# Patient Record
Sex: Male | Born: 1955 | Race: White | Hispanic: No | Smoking: Never smoker
Health system: Southern US, Community
[De-identification: ages and names within clinical notes are randomized; demographics above are authoritative.]

## PROBLEM LIST (undated history)

## (undated) DIAGNOSIS — D75839 Thrombocytosis, unspecified: Secondary | ICD-10-CM

## (undated) DIAGNOSIS — I2699 Other pulmonary embolism without acute cor pulmonale: Secondary | ICD-10-CM

## (undated) DIAGNOSIS — D649 Anemia, unspecified: Secondary | ICD-10-CM

## (undated) DIAGNOSIS — D72819 Decreased white blood cell count, unspecified: Secondary | ICD-10-CM

## (undated) DIAGNOSIS — C801 Malignant (primary) neoplasm, unspecified: Secondary | ICD-10-CM

## (undated) DIAGNOSIS — D473 Essential (hemorrhagic) thrombocythemia: Secondary | ICD-10-CM

## (undated) HISTORY — PX: KNEE ARTHROSCOPY: SUR90

---
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
## 1994-11-20 DIAGNOSIS — I2699 Other pulmonary embolism without acute cor pulmonale: Secondary | ICD-10-CM

## 1994-11-20 HISTORY — DX: Other pulmonary embolism without acute cor pulmonale: I26.99

## 1999-04-03 ENCOUNTER — Emergency Department (HOSPITAL_COMMUNITY): Admission: EM | Admit: 1999-04-03 | Discharge: 1999-04-03 | Payer: Self-pay | Admitting: Emergency Medicine

## 2005-02-27 ENCOUNTER — Emergency Department (HOSPITAL_COMMUNITY): Admission: EM | Admit: 2005-02-27 | Discharge: 2005-02-27 | Payer: Self-pay | Admitting: Emergency Medicine

## 2013-03-06 ENCOUNTER — Encounter (HOSPITAL_COMMUNITY): Payer: Self-pay | Admitting: Emergency Medicine

## 2013-03-06 ENCOUNTER — Emergency Department (HOSPITAL_COMMUNITY): Payer: Worker's Compensation

## 2013-03-06 ENCOUNTER — Emergency Department (HOSPITAL_COMMUNITY)
Admission: EM | Admit: 2013-03-06 | Discharge: 2013-03-06 | Disposition: A | Discharge status: Home / Self Care | Payer: Worker's Compensation | Attending: Emergency Medicine | Admitting: Emergency Medicine

## 2013-03-06 DIAGNOSIS — S93401A Sprain of unspecified ligament of right ankle, initial encounter: Secondary | ICD-10-CM

## 2013-03-06 DIAGNOSIS — S93409A Sprain of unspecified ligament of unspecified ankle, initial encounter: Secondary | ICD-10-CM | POA: Insufficient documentation

## 2013-03-06 DIAGNOSIS — W230XXA Caught, crushed, jammed, or pinched between moving objects, initial encounter: Secondary | ICD-10-CM | POA: Insufficient documentation

## 2013-03-06 DIAGNOSIS — S9030XA Contusion of unspecified foot, initial encounter: Secondary | ICD-10-CM | POA: Insufficient documentation

## 2013-03-06 DIAGNOSIS — Y9389 Activity, other specified: Secondary | ICD-10-CM | POA: Insufficient documentation

## 2013-03-06 DIAGNOSIS — S9032XA Contusion of left foot, initial encounter: Secondary | ICD-10-CM

## 2013-03-06 DIAGNOSIS — Y929 Unspecified place or not applicable: Secondary | ICD-10-CM | POA: Insufficient documentation

## 2013-03-06 DIAGNOSIS — X500XXA Overexertion from strenuous movement or load, initial encounter: Secondary | ICD-10-CM | POA: Insufficient documentation

## 2013-03-06 HISTORY — DX: Other pulmonary embolism without acute cor pulmonale: I26.99

## 2013-03-06 MED ORDER — NAPROXEN 500 MG PO TABS: 500.0000 mg | ORAL_TABLET | Freq: Two times a day (BID) | ORAL | Status: DC
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
## 2018-11-22 ENCOUNTER — Encounter: Payer: Self-pay | Admitting: *Deleted

## 2018-11-22 NOTE — Progress Notes (Signed)
Received referral for patient, and made appointment with Dr Maylon Peppers.   Left message on home phone number this morning with no response. Attempted to call and leave generic message at work number however the person who answered the phone would not deliver the message without detailed information which I could not provide due to [place].   Gave patient information to our scheduler and she will continue to try to reach out to patient.

## 2018-11-25 NOTE — Progress Notes (Signed)
Returned patient's phone call regarding schedule. He stated,"I'm returning Ivor phone call. I was at work and couldn't answer the phone. I informed him that Roselyn Reef scheduled him for a New Patient appointment on Friday, January,10th. Labs at 11:00 and MD at 11:30. I gave him directions to the [facility] and the phone number. Instructed him to call if he needed to change the appointment date and/or time. He verbalized understanding.

## 2018-11-26 ENCOUNTER — Other Ambulatory Visit: Payer: Self-pay | Admitting: Hematology

## 2018-11-26 DIAGNOSIS — R937 Abnormal findings on diagnostic imaging of other parts of musculoskeletal system: Secondary | ICD-10-CM

## 2018-11-26 DIAGNOSIS — D539 Nutritional anemia, unspecified: Secondary | ICD-10-CM

## 2018-11-26 NOTE — Progress Notes (Signed)
[name] CONSULT NOTE  Patient Care Team: Patient, No Pcp Per as PCP - General (General Practice)  HEME/ONC OVERVIEW: 1. Innumerable bony lesions of the right humerus, ribs and right scapula, concerning for multiple myeloma -10/2018: MRI of the right shoulder for arm pain showed innumerable enhancing lesions throughout the right humerus, ribs and right scapula; no fractures; Cr 1.16, Ca 10.3, albumin 3.1, Hgb 12.9 w/ MCV 99.7  ASSESSMENT & PLAN:   Abnormal bone lesions on MRI -I reviewed the patient's external records in detail, including orthopedic clinic notes, MRI shoulder results and lab studies -In summary, patient presented to orthopedic surgery in mid 10/2018 for several month of the right shoulder pain, and imaging studies showed innumerable bone lesions involving the right humerus, ribs, and right scapula concerning for multiple myeloma; labs were notable for borderline elevated calcium (10.3) -I have ordered SPEP with IFE, free light chains, 24-hour urine protein with UPEP/UFE, beta-2 microglobulin, LDH and vitamin D level -Given the suspicion for multiple myeloma, I have ordered PET scan to assess for presence of other bony lesions -In light of the high clinical suspicion for multiple myeloma, we will go ahead and set up the patient for bone marrow biopsy  -Once the work-up is complete, then will be able to discuss treatment options and prognosis  Severe hypercalcemia -Ca 12.2 today, likely secondary to lytic bone lesions -Na mildly low, K normal  -I discussed with the patient regarding some of the benefits and risks of Xgeva, including but not limited to, edema, rash, diarrhea, electrolyte abnormalities (hypocalcemia, hypophosphatemia, etc.), increased incidence of osteonecrosis of the jaw, and in rare cases, anaphylaxis -The patient expressed understanding, and agreed to proceed with the plan of care -We will administer 1L NS and 1 dose of Xgeva today  -The  patient is strongly encouraged to maintain adequate oral hydration  -I discussed the case with Dr. 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 Ambulatory referral to Dentistry    Referral Priority:   Urgent    Referral Type:   Consultation    Referral Reason:   Specialty Services Required    Requested Specialty:   Dental General Practice    Number of Visits Requested:   1    A total of more than 60 minutes were spent face-to-face with the patient during this encounter and over half of that time was spent on counseling and coordination of care as outlined above.    All questions were answered. The patient knows to call the clinic with any problems, questions or concerns.  Return in 1 week for labs and clinic follow-up.  [name], MD 11/29/2018 12:27 PM   CHIEF COMPLAINTS/PURPOSE OF CONSULTATION:  "I am here to find out what next"  HISTORY OF PRESENTING ILLNESS:  Danny Juarez 63 y.o. male is  here because of recent abnormal MRI of the right shoulder.  Patient reports that over the past 1 and half month, he has had progressive fatigue, generalized weakness, especially involving the right leg and left arm.  During the same time period, he has lost about 10 pounds.  He also reports  mild exertional dyspnea, chest wall soreness, and increased urination, but denies any fever, chill, night sweats, lymphadenopathy, abdominal pain, nausea, vomiting, diarrhea, abnormal bleeding or bruising. He presented to orthopedic surgeon in mid 10/2018 for evaluation of persistent right-sided arm pain.  Plain x-rays showed multiple lucencies in the proximal humerus, several which eroding into the cortex, concerning for multiple myeloma.  MRI of the right shoulder was subsequently done, which showed innumerable enhancing lesions throughout the right humerus, ribs, and the right scapula without any fracture.  Patient was referred to oncology for further evaluation of possible multiple myeloma.  I have reviewed his chart and materials related to his cancer extensively and collaborated history with the patient. Summary of oncologic history is as follows:  No history exists.    MEDICAL HISTORY:  Past Medical History:  Diagnosis Date  . PE (pulmonary embolism) 1996    SURGICAL HISTORY: Past Surgical History:  Procedure Laterality Date  . KNEE ARTHROSCOPY      SOCIAL HISTORY: Social History   Socioeconomic History  . Marital status: Married    Spouse name: Not on file  . Number of children: Not on file  . Years of education: Not on file  . Highest education level: Not on file  Occupational History  . Not on file  Social Needs  . Financial resource strain: Not on file  . Food insecurity:    Worry: Not on file    Inability: Not on file  . Transportation needs:    Medical: Not on file    Non-medical: Not on file  Tobacco Use  . Smoking status: Never Smoker  . Smokeless tobacco: Never Used  Substance and Sexual Activity  . Alcohol use: Yes    Comment: occassional  . Drug use: No  . Sexual activity: Not on file  Lifestyle  . Physical activity:    Days per week: Not on file    Minutes per session: Not on file  . Stress: Not on file  Relationships  . Social connections:     Talks on phone: Not on file    Gets together: Not on file    Attends religious service: Not on file    Active member of club or organization: Not on file    Attends meetings of clubs or organizations: Not on file    Relationship status: Not on file  . Intimate partner violence:    Fear of current or ex partner: Not on file    Emotionally abused: Not on file    Physically abused: Not on file    Forced sexual activity: Not on file  Other Topics Concern  . Not on file  Social History Narrative  . Not on file    FAMILY HISTORY: History reviewed. No pertinent family history.  ALLERGIES:  has No Known Allergies.  MEDICATIONS:  Current Outpatient Medications  Medication Sig Dispense Refill  . ferrous sulfate 325 (65 FE) MG EC tablet Take 325 mg by mouth 3 (three) times daily with meals.    . Multiple Vitamin (MULTIVITAMIN WITH MINERALS) TABS Take 1 tablet by mouth daily.     No current facility-administered medications for this visit.  REVIEW OF SYSTEMS:   Constitutional: ( - ) fevers, ( - )  chills , ( - ) night sweats Eyes: ( - ) blurriness of vision, ( - ) double vision, ( - ) watery eyes Ears, nose, mouth, throat, and face: ( - ) mucositis, ( - ) sore throat Respiratory: ( - ) cough, ( - ) dyspnea, ( - ) wheezes Cardiovascular: ( - ) palpitation, ( - ) chest discomfort, ( - ) lower extremity swelling Gastrointestinal:  ( - ) nausea, ( - ) heartburn, ( - ) change in bowel habits Skin: ( - ) abnormal skin rashes Lymphatics: ( - ) new lymphadenopathy, ( - ) easy bruising Neurological: ( - ) numbness, ( - ) tingling, ( - ) new weaknesses Behavioral/Psych: ( - ) mood change, ( - ) new changes  All other systems were reviewed with the patient and are negative.  PHYSICAL EXAMINATION: ECOG PERFORMANCE STATUS: 1 - Symptomatic but completely ambulatory  Vitals:   11/29/18 1100  BP: 117/67  Pulse: 71  Resp: 17  Temp: 98.4 F (36.9 C)  SpO2: 100%   There were no vitals  filed for this visit.  GENERAL: alert, no distress and comfortable, well appearing SKIN: skin color, texture, turgor are normal, no rashes or significant lesions EYES: conjunctiva are pink and non-injected, sclera clear OROPHARYNX: no exudate, no erythema; lips, buccal mucosa, and tongue normal  NECK: supple, non-tender LYMPH:  no palpable lymphadenopathy in the cervical or axillary LUNGS: clear to auscultation and percussion with normal breathing effort HEART: regular rate & rhythm, no murmurs, no lower extremity edema ABDOMEN: soft, non-tender, non-distended, normal bowel sounds Musculoskeletal: no cyanosis of digits and no clubbing  PSYCH: alert & oriented x 3, fluent speech NEURO: no focal motor/sensory deficits, slightly wide based gait due to lower extremity pain   LABORATORY DATA:  I have reviewed the data as listed Lab Results  Component Value Date   WBC 3.7 (L) 11/29/2018   HGB 10.0 (L) 11/29/2018   HCT 27.5 (L) 11/29/2018   MCV 98.6 11/29/2018   PLT 187 11/29/2018   Lab Results  Component Value Date   NA 133 (L) 11/29/2018   K 3.9 11/29/2018   CL 98 11/29/2018   CO2 31 11/29/2018    RADIOGRAPHIC STUDIES: I have personally reviewed the radiological images as listed and agreed with the findings in the report.

## 2018-11-27 ENCOUNTER — Telehealth: Payer: Self-pay | Admitting: Hematology

## 2018-11-27 NOTE — Telephone Encounter (Signed)
Spoke with patient regarding appointment for 11/29/2018.  He stated that he was already aware of this appoiontment

## 2018-11-29 ENCOUNTER — Encounter (INDEPENDENT_AMBULATORY_CARE_PROVIDER_SITE_OTHER): Payer: Self-pay

## 2018-11-29 ENCOUNTER — Other Ambulatory Visit: Payer: Self-pay

## 2018-11-29 ENCOUNTER — Encounter: Payer: Self-pay | Admitting: *Deleted

## 2018-11-29 ENCOUNTER — Telehealth: Payer: Self-pay | Admitting: *Deleted

## 2018-11-29 ENCOUNTER — Other Ambulatory Visit: Payer: Self-pay | Admitting: Hematology

## 2018-11-29 ENCOUNTER — Inpatient Hospital Stay (HOSPITAL_BASED_OUTPATIENT_CLINIC_OR_DEPARTMENT_OTHER): Payer: 59 | Admitting: Hematology

## 2018-11-29 ENCOUNTER — Encounter: Payer: Self-pay | Admitting: Hematology

## 2018-11-29 ENCOUNTER — Inpatient Hospital Stay: Payer: 59 | Attending: Hematology

## 2018-11-29 ENCOUNTER — Inpatient Hospital Stay: Payer: 59

## 2018-11-29 DIAGNOSIS — D6481 Anemia due to antineoplastic chemotherapy: Secondary | ICD-10-CM | POA: Insufficient documentation

## 2018-11-29 DIAGNOSIS — D649 Anemia, unspecified: Secondary | ICD-10-CM | POA: Diagnosis not present

## 2018-11-29 DIAGNOSIS — D539 Nutritional anemia, unspecified: Secondary | ICD-10-CM

## 2018-11-29 DIAGNOSIS — G629 Polyneuropathy, unspecified: Secondary | ICD-10-CM | POA: Diagnosis not present

## 2018-11-29 DIAGNOSIS — N179 Acute kidney failure, unspecified: Secondary | ICD-10-CM

## 2018-11-29 DIAGNOSIS — M899 Disorder of bone, unspecified: Secondary | ICD-10-CM | POA: Diagnosis not present

## 2018-11-29 DIAGNOSIS — E871 Hypo-osmolality and hyponatremia: Secondary | ICD-10-CM | POA: Diagnosis not present

## 2018-11-29 DIAGNOSIS — D72819 Decreased white blood cell count, unspecified: Secondary | ICD-10-CM | POA: Insufficient documentation

## 2018-11-29 DIAGNOSIS — C9000 Multiple myeloma not having achieved remission: Secondary | ICD-10-CM | POA: Diagnosis present

## 2018-11-29 DIAGNOSIS — D701 Agranulocytosis secondary to cancer chemotherapy: Secondary | ICD-10-CM | POA: Insufficient documentation

## 2018-11-29 DIAGNOSIS — R937 Abnormal findings on diagnostic imaging of other parts of musculoskeletal system: Secondary | ICD-10-CM

## 2018-11-29 DIAGNOSIS — N189 Chronic kidney disease, unspecified: Secondary | ICD-10-CM | POA: Insufficient documentation

## 2018-11-29 DIAGNOSIS — T451X5A Adverse effect of antineoplastic and immunosuppressive drugs, initial encounter: Secondary | ICD-10-CM

## 2018-11-29 LAB — CBC WITH DIFFERENTIAL (CANCER CENTER ONLY)
Abs Immature Granulocytes: 0.01 10*3/uL (ref 0.00–0.07)
BASOS ABS: 0 10*3/uL (ref 0.0–0.1)
Basophils Relative: 0 %
EOS ABS: 0.1 10*3/uL (ref 0.0–0.5)
Eosinophils Relative: 2 %
HCT: 27.5 % — ABNORMAL LOW (ref 39.0–52.0)
Hemoglobin: 10 g/dL — ABNORMAL LOW (ref 13.0–17.0)
Immature Granulocytes: 0 %
LYMPHS PCT: 32 %
Lymphs Abs: 1.2 10*3/uL (ref 0.7–4.0)
MCH: 35.8 pg — ABNORMAL HIGH (ref 26.0–34.0)
MCHC: 36.4 g/dL — ABNORMAL HIGH (ref 30.0–36.0)
MCV: 98.6 fL (ref 80.0–100.0)
MONO ABS: 0.3 10*3/uL (ref 0.1–1.0)
Monocytes Relative: 8 %
NRBC: 0 % (ref 0.0–0.2)
Neutro Abs: 2.1 10*3/uL (ref 1.7–7.7)
Neutrophils Relative %: 58 %
Platelet Count: 187 10*3/uL (ref 150–400)
RBC: 2.79 MIL/uL — AB (ref 4.22–5.81)
RDW: 11.1 % — AB (ref 11.5–15.5)
WBC: 3.7 10*3/uL — AB (ref 4.0–10.5)

## 2018-11-29 LAB — CMP (CANCER CENTER ONLY)
ALT: 30 U/L (ref 0–44)
ANION GAP: 4 — AB (ref 5–15)
AST: 24 U/L (ref 15–41)
Albumin: 2.9 g/dL — ABNORMAL LOW (ref 3.5–5.0)
Alkaline Phosphatase: 59 U/L (ref 38–126)
BILIRUBIN TOTAL: 0.3 mg/dL (ref 0.3–1.2)
BUN: 65 mg/dL — ABNORMAL HIGH (ref 8–23)
CHLORIDE: 98 mmol/L (ref 98–111)
CO2: 31 mmol/L (ref 22–32)
Calcium: 12.2 mg/dL — ABNORMAL HIGH (ref 8.9–10.3)
Creatinine: 2.89 mg/dL — ABNORMAL HIGH (ref 0.61–1.24)
GFR, EST NON AFRICAN AMERICAN: 22 mL/min — AB (ref >60)
GFR, Est AFR Am: 26 mL/min — ABNORMAL LOW (ref >60)
Glucose, Bld: 108 mg/dL — ABNORMAL HIGH (ref 70–99)
POTASSIUM: 3.9 mmol/L (ref 3.5–5.1)
Sodium: 133 mmol/L — ABNORMAL LOW (ref 135–145)
Total Protein: 9.4 g/dL — ABNORMAL HIGH (ref 6.5–8.1)

## 2018-11-29 LAB — FOLATE: FOLATE: 84.6 ng/mL (ref >5.9)

## 2018-11-29 LAB — VITAMIN B12: Vitamin B-12: 521 pg/mL (ref 180–914)

## 2018-11-29 LAB — SAVE SMEAR (SSMR)

## 2018-11-29 LAB — LACTATE DEHYDROGENASE: LDH: 180 U/L (ref 98–192)

## 2018-11-29 MED ORDER — SODIUM CHLORIDE 0.9 % IV SOLN
Freq: Once | INTRAVENOUS | Status: AC
  Administered 2018-11-29: 13:00:00 via INTRAVENOUS
  Filled 2018-11-29: qty 250

## 2018-11-29 MED ORDER — DENOSUMAB 120 MG/1.7ML ~~LOC~~ SOLN
120.0000 mg | Freq: Once | SUBCUTANEOUS | Status: AC
  Administered 2018-11-29: 120 mg via SUBCUTANEOUS

## 2018-11-29 MED ORDER — DENOSUMAB 120 MG/1.7ML ~~LOC~~ SOLN
SUBCUTANEOUS | Status: AC
  Filled 2018-11-29: qty 1.7

## 2018-11-29 NOTE — Patient Instructions (Addendum)
Dehydration, Adult  Dehydration is a condition in which there is not enough fluid or water in the body. This happens when you lose more fluids than you take in. Important organs, such as the kidneys, brain, and heart, cannot function without a proper amount of fluids. Any loss of fluids from the body can lead to dehydration. Dehydration can range from mild to severe. This condition should be treated right away to prevent it from becoming severe. What are the causes? This condition may be caused by:  Vomiting.  Diarrhea.  Excessive sweating, such as from heat exposure or exercise.  Not drinking enough fluid, especially: ? When ill. ? While doing activity that requires a lot of energy.  Excessive urination.  Fever.  Infection.  Certain medicines, such as medicines that cause the body to lose excess fluid (diuretics).  Inability to access safe drinking water.  Reduced physical ability to get adequate water and food. What increases the risk? This condition is more likely to develop in people:  Who have a poorly controlled long-term (chronic) illness, such as diabetes, heart disease, or kidney disease.  Who are age 67 or older.  Who are disabled.  Who live in a place with high altitude.  Who play endurance sports. What are the signs or symptoms? Symptoms of mild dehydration may include:  Thirst.  Dry lips.  Slightly dry mouth.  Dry, warm skin.  Dizziness. Symptoms of moderate dehydration may include:  Very dry mouth.  Muscle cramps.  Dark urine. Urine may be the color of tea.  Decreased urine production.  Decreased tear production.  Heartbeat that is irregular or faster than normal (palpitations).  Headache.  Light-headedness, especially when you stand up from a sitting position.  Fainting (syncope). Symptoms of severe dehydration may include:  Changes in skin, such as: ? Cold and clammy skin. ? Blotchy (mottled) or pale skin. ? Skin that does  not quickly return to normal after being lightly pinched and released (poor skin turgor).  Changes in body fluids, such as: ? Extreme thirst. ? No tear production. ? Inability to sweat when body temperature is high, such as in hot weather. ? Very little urine production.  Changes in vital signs, such as: ? Weak pulse. ? Pulse that is more than 100 beats a minute when sitting still. ? Rapid breathing. ? Low blood pressure.  Other changes, such as: ? Sunken eyes. ? Cold hands and feet. ? Confusion. ? Lack of energy (lethargy). ? Difficulty waking up from sleep. ? Short-term weight loss. ? Unconsciousness. How is this diagnosed? This condition is diagnosed based on your symptoms and a physical exam. Blood and urine tests may be done to help confirm the diagnosis. How is this treated? Treatment for this condition depends on the severity. Mild or moderate dehydration can often be treated at home. Treatment should be started right away. Do not wait until dehydration becomes severe. Severe dehydration is an emergency and it needs to be treated in a hospital. Treatment for mild dehydration may include:  Drinking more fluids.  Replacing salts and minerals in your blood (electrolytes) that you may have lost. Treatment for moderate dehydration may include:  Drinking an oral rehydration solution (ORS). This is a drink that helps you replace fluids and electrolytes (rehydrate). It can be found at pharmacies and retail stores. Treatment for severe dehydration may include:  Receiving fluids through an IV tube.  Receiving an electrolyte solution through a feeding tube that is passed through your nose and  into your stomach (nasogastric tube, or NG tube).  Correcting any abnormalities in electrolytes.  Treating the underlying cause of dehydration. Follow these instructions at home:  If directed by your health care provider, drink an ORS: ? Make an ORS by following instructions on the  package. ? Start by drinking small amounts, about  cup (120 mL) every 5-10 minutes. ? Slowly increase how much you drink until you have taken the amount recommended by your health care provider.  Drink enough clear fluid to keep your urine clear or pale yellow. If you were told to drink an ORS, finish the ORS first, then start slowly drinking other clear fluids. Drink fluids such as: ? Water. Do not drink only water. Doing that can lead to having too little salt (sodium) in the body (hyponatremia). ? Ice chips. ? Fruit juice that you have added water to (diluted fruit juice). ? Low-calorie sports drinks.  Avoid: ? Alcohol. ? Drinks that contain a lot of sugar. These include high-calorie sports drinks, fruit juice that is not diluted, and soda. ? Caffeine. ? Foods that are greasy or contain a lot of fat or sugar.  Take over-the-counter and prescription medicines only as told by your health care provider.  Do not take sodium tablets. This can lead to having too much sodium in the body (hypernatremia).  Eat foods that contain a healthy balance of electrolytes, such as bananas, oranges, potatoes, tomatoes, and spinach.  Keep all follow-up visits as told by your health care provider. This is important. Contact a health care provider if:  You have abdominal pain that: ? Gets worse. ? Stays in one area (localizes).  You have a rash.  You have a stiff neck.  You are more irritable than usual.  You are sleepier or more difficult to wake up than usual.  You feel weak or dizzy.  You feel very thirsty.  You have urinated only a small amount of very dark urine over 6-8 hours. Get help right away if:  You have symptoms of severe dehydration.  You cannot drink fluids without vomiting.  Your symptoms get worse with treatment.  You have a fever.  You have a severe headache.  You have vomiting or diarrhea that: ? Gets worse. ? Does not go away.  You have blood or green matter  (bile) in your vomit.  You have blood in your stool. This may cause stool to look black and tarry.  You have not urinated in 6-8 hours.  You faint.  Your heart rate while sitting still is over 100 beats a minute.  You have trouble breathing. This information is not intended to replace advice given to you by your health care provider. Make sure you discuss any questions you have with your health care provider. Document Released: 11/06/2005 Document Revised: 06/02/2016 Document Reviewed: 12/31/2015 Elsevier Interactive Patient Education  2019 [employer].  Denosumab injection Delton See) What is this medicine? DENOSUMAB (den oh sue mab) slows bone breakdown. Prolia is used to treat osteoporosis in women after menopause and in men, and in people who are taking corticosteroids for 6 months or more. Delton See is used to treat a high calcium level due to cancer and to prevent bone fractures and other bone problems caused by multiple myeloma or cancer bone metastases. Delton See is also used to treat giant cell tumor of the bone. This medicine may be used for other purposes; ask your health care provider or pharmacist if you have questions. COMMON BRAND NAME(S): Prolia, XGEVA  What should I tell my health care provider before I take this medicine? They need to know if you have any of these conditions: -dental disease -having surgery or tooth extraction -infection -kidney disease -low levels of calcium or Vitamin D in the blood -malnutrition -on hemodialysis -skin conditions or sensitivity -thyroid or parathyroid disease -an unusual reaction to denosumab, other medicines, foods, dyes, or preservatives -pregnant or trying to get pregnant -breast-feeding How should I use this medicine? This medicine is for injection under the skin. It is given by a health care professional in a hospital or clinic setting. A special MedGuide will be given to you before each treatment. Be sure to read this information  carefully each time. For Prolia, talk to your pediatrician regarding the use of this medicine in children. Special care may be needed. For Delton See, talk to your pediatrician regarding the use of this medicine in children. While this drug may be prescribed for children as young as 13 years for selected conditions, precautions do apply. Overdosage: If you think you have taken too much of this medicine contact a poison control center or emergency room at once. NOTE: This medicine is only for you. Do not share this medicine with others. What if I miss a dose? It is important not to miss your dose. Call your doctor or health care professional if you are unable to keep an appointment. What may interact with this medicine? Do not take this medicine with any of the following medications: -other medicines containing denosumab This medicine may also interact with the following medications: -medicines that lower your chance of fighting infection -steroid medicines like prednisone or cortisone This list may not describe all possible interactions. Give your health care provider a list of all the medicines, herbs, non-prescription drugs, or dietary supplements you use. Also tell them if you smoke, drink alcohol, or use illegal drugs. Some items may interact with your medicine. What should I watch for while using this medicine? Visit your doctor or health care professional for regular checks on your progress. Your doctor or health care professional may order blood tests and other tests to see how you are doing. Call your doctor or health care professional for advice if you get a fever, chills or sore throat, or other symptoms of a cold or flu. Do not treat yourself. This drug may decrease your body's ability to fight infection. Try to avoid being around people who are sick. You should make sure you get enough calcium and vitamin D while you are taking this medicine, unless your doctor tells you not to. Discuss the  foods you eat and the vitamins you take with your health care professional. See your dentist regularly. Brush and floss your teeth as directed. Before you have any dental work done, tell your dentist you are receiving this medicine. Do not become pregnant while taking this medicine or for 5 months after stopping it. Talk with your doctor or health care professional about your birth control options while taking this medicine. Women should inform their doctor if they wish to become pregnant or think they might be pregnant. There is a potential for serious side effects to an unborn child. Talk to your health care professional or pharmacist for more information. What side effects may I notice from receiving this medicine? Side effects that you should report to your doctor or health care professional as soon as possible: -allergic reactions like skin rash, itching or hives, swelling of the face, lips, or tongue -bone  pain -breathing problems -dizziness -jaw pain, especially after dental work -redness, blistering, peeling of the skin -signs and symptoms of infection like fever or chills; cough; sore throat; pain or trouble passing urine -signs of low calcium like fast heartbeat, muscle cramps or muscle pain; pain, tingling, numbness in the hands or feet; seizures -unusual bleeding or bruising -unusually weak or tired Side effects that usually do not require medical attention (report to your doctor or health care professional if they continue or are bothersome): -constipation -diarrhea -headache -joint pain -loss of appetite -muscle pain -runny nose -tiredness -upset stomach This list may not describe all possible side effects. Call your doctor for medical advice about side effects. You may report side effects to FDA at 1-800-FDA-1088. Where should I keep my medicine? This medicine is only given in a clinic, doctor's office, or other health care setting and will not be stored at home. NOTE: This  sheet is a summary. It may not cover all possible information. If you have questions about this medicine, talk to your doctor, pharmacist, or health care provider.  2019 Elsevier/Gold Standard (2018-03-15 16:10:44)

## 2018-11-29 NOTE — Telephone Encounter (Signed)
Received voice mail message from Dr. Dorothyann Gibbs stating,"patient can be seen in the Dental clinic on Monday, January, 13th at 8:30 am for a pre Xgeva exam. The Dental clinic office number is [phone]. This message was given to Dr. Maylon Peppers. Directions and appointment time was given to the patient. He verbalized understanding.

## 2018-11-29 NOTE — Progress Notes (Signed)
..  Initial RN Navigator Patient Visit  Name: Danny Juarez Date of Referral : 11/22/2018 Diagnosis: Multiple Myeloma  Met with patient prior to their visit with MD. Hanley Seamen patient "Your Patient Navigator" handout which explains my role, areas in which I am able to help, and all the contact information for myself and the office. Also gave patient MD and Navigator business card. Reviewed with patient the general overview of expected course after initial diagnosis and time frame for all steps to be completed.  Patient completed visit with Dr. Maylon Peppers  Revisited with patient after MD visit. Patient will need  - following radiological exams  BM Biopsy - Dr Maylon Peppers will reach out to R[facility] Comment to schedule bedside BM  PET - scheduled for 12/11/2018 at 74  The following appointments were made while patient was still here in the office. Address and phone numbers of imagine locations, referring MD offices, surgical procedures which have been scheduled have been provided.   Patient understands all follow up procedures and expectations. They have my number to reach out for any further clarification or additional needs. Will call patient in 5-7 days to see if any further needs have presented, or if patient has any further questions or needs.

## 2018-11-30 LAB — VITAMIN D 25 HYDROXY (VIT D DEFICIENCY, FRACTURES): VIT D 25 HYDROXY: 58.4 ng/mL (ref 30.0–100.0)

## 2018-11-30 LAB — BETA 2 MICROGLOBULIN, SERUM: Beta-2 Microglobulin: 6.8 mg/L — ABNORMAL HIGH (ref 0.6–2.4)

## 2018-12-02 ENCOUNTER — Other Ambulatory Visit: Payer: Self-pay

## 2018-12-02 ENCOUNTER — Ambulatory Visit: Payer: Self-pay | Admitting: Hematology

## 2018-12-02 ENCOUNTER — Encounter (HOSPITAL_COMMUNITY): Payer: Self-pay | Admitting: Dentistry

## 2018-12-02 ENCOUNTER — Ambulatory Visit (HOSPITAL_COMMUNITY): Payer: Self-pay | Admitting: Dentistry

## 2018-12-02 DIAGNOSIS — M278 Other specified diseases of jaws: Secondary | ICD-10-CM

## 2018-12-02 DIAGNOSIS — K036 Deposits [accretions] on teeth: Secondary | ICD-10-CM

## 2018-12-02 DIAGNOSIS — M264 Malocclusion, unspecified: Secondary | ICD-10-CM

## 2018-12-02 DIAGNOSIS — Z01818 Encounter for other preprocedural examination: Secondary | ICD-10-CM

## 2018-12-02 DIAGNOSIS — K06010 Localized gingival recession, unspecified: Secondary | ICD-10-CM

## 2018-12-02 DIAGNOSIS — M2630 Unspecified anomaly of tooth position of fully erupted tooth or teeth: Secondary | ICD-10-CM

## 2018-12-02 DIAGNOSIS — K031 Abrasion of teeth: Secondary | ICD-10-CM

## 2018-12-02 DIAGNOSIS — K045 Chronic apical periodontitis: Secondary | ICD-10-CM

## 2018-12-02 DIAGNOSIS — K08409 Partial loss of teeth, unspecified cause, unspecified class: Secondary | ICD-10-CM

## 2018-12-02 DIAGNOSIS — K0530 Chronic periodontitis, unspecified: Secondary | ICD-10-CM

## 2018-12-02 DIAGNOSIS — K029 Dental caries, unspecified: Secondary | ICD-10-CM | POA: Diagnosis not present

## 2018-12-02 DIAGNOSIS — K055 Other periodontal diseases: Secondary | ICD-10-CM

## 2018-12-02 DIAGNOSIS — M270 Developmental disorders of jaws: Secondary | ICD-10-CM

## 2018-12-02 DIAGNOSIS — K0889 Other specified disorders of teeth and supporting structures: Secondary | ICD-10-CM

## 2018-12-02 DIAGNOSIS — K030 Excessive attrition of teeth: Secondary | ICD-10-CM

## 2018-12-02 LAB — KAPPA/LAMBDA LIGHT CHAINS
KAPPA, LAMDA LIGHT CHAIN RATIO: 1.95 — AB (ref 0.26–1.65)
Kappa free light chain: 15.2 mg/L (ref 3.3–19.4)
LAMDA FREE LIGHT CHAINS: 7.8 mg/L (ref 5.7–26.3)

## 2018-12-02 NOTE — Patient Instructions (Signed)
Patient is being referred to a general dentist with expertise in both medical and dental complexity. The patient may then be referred to an oral surgeon for extraction procedures with alveoloplasty and pre-prosthetic surgery as needed. Ideally, the patient should not be administered further Xgeva therapy until dental treatment has been provided.  Dr. Enrique Sack

## 2018-12-02 NOTE — Progress Notes (Addendum)
DENTAL CONSULTATION  Date of Consultation:  12/02/2018 Patient Name:   Danny Juarez Date of Birth:   05-17-1956 Medical Record Number: [id number]  VITALS: BP (!) 111/58 (BP Location: Right Arm)   Pulse 70   Temp 97.9 F (36.6 C)   CHIEF COMPLAINT: Patient referred for a dental consultation by Dr. Maylon Peppers.  HPI: Danny Juarez Is a 63 year old male recently diagnosed with hypercalcemia with multiple bone lesions the most likely represents multiple myeloma. Patient is still being worked up for myeloma with pending PET scan and bone marrow biopsy. Patient with 1 dose of Xgeva to treat the hypercalcemia with further doses held until patient has dental clearance. Patient is now seen as part of a Xgeva therapy dental protocol examination.  The patient currently denies acute toothaches, swellings or abscesses.  Patient did have a history of lower right molar toothache symptoms approximately 2 years ago. Patient points to tooth #30 as the offending tooth. Patient describes the intermittent pain as being dull and achy in nature. Patient indicates the pain reached an intensity of 5 out of 10 but is currently 0 out of 10 in intensity. The patient was last seen by his primary dentist, Dr. Wyline Beady, in March 2019 for an exam and cleaning. Patient was seen prior to that in late 2018 for a comprehensive dental examination.  Patient indicates that he was given a treatment plan " for about $20,000" by patient report.  Patient did not follow-up with that dental treatment plan.  Prior to the dental appointment in 2018, the patient had not seen a dentist in over 10 years. The patient denies having any partial dentures. The patient denies having dental phobia.   PROBLEM LIST: Patient Active Problem List   Diagnosis Date Noted  . Abnormal MRI scan, bone 11/29/2018  . Normocytic anemia 11/29/2018  . Leukopenia 11/29/2018  . Hypercalcemia 11/29/2018  . AKI (acute kidney injury) ([place]) 11/29/2018     PMH: Past Medical History:  Diagnosis Date  . PE (pulmonary embolism) 1996    PSH: Past Surgical History:  Procedure Laterality Date  . KNEE ARTHROSCOPY      ALLERGIES: No Known Allergies  MEDICATIONS: Current Outpatient Medications  Medication Sig Dispense Refill  . Denosumab (XGEVA Gervais) Inject into the skin every 28 (twenty-eight) days.     . ferrous sulfate 325 (65 FE) MG EC tablet Take 325 mg by mouth 3 (three) times daily with meals.    . Multiple Vitamin (MULTIVITAMIN WITH MINERALS) TABS Take 1 tablet by mouth daily.     No current facility-administered medications for this visit.     LABS: Lab Results  Component Value Date   WBC 3.7 (L) 11/29/2018   HGB 10.0 (L) 11/29/2018   HCT 27.5 (L) 11/29/2018   MCV 98.6 11/29/2018   PLT 187 11/29/2018      Component Value Date/Time   NA 133 (L) 11/29/2018 1048   K 3.9 11/29/2018 1048   CL 98 11/29/2018 1048   CO2 31 11/29/2018 1048   GLUCOSE 108 (H) 11/29/2018 1048   BUN 65 (H) 11/29/2018 1048   CREATININE 2.89 (H) 11/29/2018 1048   CALCIUM 12.2 (H) 11/29/2018 1048   GFRNONAA 22 (L) 11/29/2018 1048   GFRAA 26 (L) 11/29/2018 1048   No results found for: INR, PROTIME No results found for: PTT  SOCIAL HISTORY: Social History   Socioeconomic History  . Marital status: Divorced    Spouse name: Not on file  . Number of children: 1  .  Years of education: Not on file  . Highest education level: Not on file  Occupational History  . Not on file  Social Needs  . Financial resource strain: Not on file  . Food insecurity:    Worry: Not on file    Inability: Not on file  . Transportation needs:    Medical: Not on file    Non-medical: Not on file  Tobacco Use  . Smoking status: Never Smoker  . Smokeless tobacco: Never Used  Substance and Sexual Activity  . Alcohol use: Yes    Comment: occassional  . Drug use: No  . Sexual activity: Not on file  Lifestyle  . Physical activity:    Days per week: Not on  file    Minutes per session: Not on file  . Stress: Not on file  Relationships  . Social connections:    Talks on phone: Not on file    Gets together: Not on file    Attends religious service: Not on file    Active member of club or organization: Not on file    Attends meetings of clubs or organizations: Not on file    Relationship status: Not on file  . Intimate partner violence:    Fear of current or ex partner: Not on file    Emotionally abused: Not on file    Physically abused: Not on file    Forced sexual activity: Not on file  Other Topics Concern  . Not on file  Social History Narrative   Patient is divorced with one daughter that lives in [place], [place].   Patient has never smoked nor used smokeless tobacco.   Patient with occasional use of alcohol.   Patient denies use of illicit drugs.   Patient is a retired Set designer.    FAMILY HISTORY: History reviewed. No pertinent family history.  REVIEW OF SYSTEMS: Reviewed with the patient as per History of present illness. Psych: Patient denies having dental phobia.  DENTAL HISTORY: CHIEF COMPLAINT: Patient referred for a dental consultation by Dr. Maylon Peppers.  HPI: Danny Juarez Is a 63 year old male recently diagnosed with hypercalcemia with multiple bone lesions the most likely represents multiple myeloma. Patient is still being worked up for myeloma with pending PET scan and bone marrow biopsy. Patient with 1 dose of Xgeva to treat the hypercalcemia with further doses held until patient has dental clearance. Patient is now seen as part of a Xgeva therapy dental protocol examination.  The patient currently denies acute toothaches, swellings or abscesses.  Patient did have a history of lower right molar toothache symptoms approximately 2 years ago. Patient points to tooth #30 as the offending tooth. Patient describes the intermittent pain as being dull and achy in nature. Patient indicates the pain  reached an intensity of 5 out of 10 but is currently 0 out of 10 in intensity. The patient was last seen by his primary dentist, Dr. Wyline Beady, in March 2019 for an exam and cleaning. Patient was seen prior to that in late 2018 for a comprehensive dental examination.  Patient indicates that he was given a treatment plan " for about $20,000" by patient report.  Patient did not follow-up with that dental treatment plan.  Prior to the dental appointment in 2018, the patient had not seen a dentist in over 10 years. The patient denies having any partial dentures. The patient denies having dental phobia.   DENTAL EXAMINATION: GENERAL:  The patient is a well-developed, well-nourished  male in no acute distress. HEAD AND NECK:  There is no palpable neck lymphadenopathy. The patient denies acute TMJ symptoms. INTRAORAL EXAM:  The patient has normal saliva. The patient has bilateral mandibular lingual tori. The patient has maxillary right and maxillary left buccal exostoses.  There is no evidence of oral abscess formation.  There are multiple excessive flexure lesions noted.  There is evidence of maxillary and mandibular occlusal and incisal attrition. DENTITION:  Patient is missing tooth numbers 1, 16, 17, and 32.  There are multiple malpositioned teeth and lower anterior crowding. PERIODONTAL:  The patient has chronic periodontitis with plaque and calculus accumulations, generalized gingival recession, and lack of the test gingiva, tooth mobility, and incipient to severe bone loss. DENTAL CARIES/SUBOPTIMAL RESTORATIONS:  Multiple dental caries and multiple flexure lesions are noted. Multiple flexure lesions appear to be close to pulpal exposure. ENDODONTIC:  The patient has a history of acute pulpitis symptoms involving tooth #30. The patient denies any acute pulpitis symptoms -today. Patient has periapical pathology and radiolucency associated with tooth numbers 24, 25, and 30.  Multiple flexure lesions are  close to impinging on the pulp on tooth numbers 4, 5, 12, 13, 19, 20, 21, 28, 29, and 30. CROWN AND BRIDGE:  There are no crown or bridge restorations noted. PROSTHODONTIC:  Patient denies having partial dentures. OCCLUSION:  The patient has a poor occlusal scheme secondary to missing teeth, multiple malpositioned teeth, and evidence of incisal and occlusal attrition.  RADIOGRAPHIC INTERPRETATION: An orthopantogram was taken and supplemented with a full series of dental radiographs. There are missing tooth numbers 1, 16, 17, and 32. There is moderate to severe bone loss noted. There multiple areas appear apical pathology and radiolucency at the apices of tooth numbers 24, 25, and 30 and possibly 23 and 26.  There are multiple radiolucent areas of the teeth consistent with flexure lesions. There are multiple mandibular radiopaque areas consistent with bilateral mandibular lingual tori. There are multiple amalgam restorations noted. There are multiple radiolucent/ "punched out" areas involving the left and right angle of the mandible and right body of the mandible consistent with multiple myeloma.  ASSESSMENTS: 1. Multiple bone lesions with probable /currently undiagnosed multiple myeloma 2. Hypercalcemia  3. Xgeva therapy dental protocol examination 4. Chronic apical periodontitis 5. Dental caries 6. Multiple flexure lesions 7. Bilateral mandibular lingual tori 8. Bilateral maxillary posterior buccal exostoses 9. Chronic periodontitis with bone loss 10.Gingival recession 11. Lack of attached gingiva 12. Tooth mobility 13. Multiple missing teeth 14. Multiple malpositioned teeth 15. Maxillary and mandibular interincisal and occlusal attrition 16. Poor occlusal scheme and malocclusion 17. Risk for osteonecrosis of the jaw with future invasive dental procedures after the administration of Xgeva therapy  PLAN/RECOMMENDATIONS: 1. I discussed the risks, benefits, and complications of various  treatment options with the patient in relationship to his medical and dental conditions, use of Xgeva therapy, and risk for osteonecrosis the jaw with invasive dental procedures. We discussed various treatment options to include no treatment, multiple extractions with alveoloplasty, pre-prosthetic surgery as indicated, periodontal therapy, dental restorations, root canal therapy, crown and bridge therapy, implant therapy, and replacement of missing teeth as indicated. We discussed referral to an oral surgeon for dental extractions with alveoloplasty and pre-prosthetic surgery.  We also discussed referral to a general dentist with expertise in treatment of pateints with both medical and dental complexities.The patient currently wishes to proceed with referral to a general dentist that has expertise in both medical and dental complexities.  The patient may then be referred to an oral surgeon for surgical procedures as needed. In the meantime, ideally the Xgeva therapy should be held to allow for initial dental therapy and associated healing.  Additional doses of Xgeva therapy may significantly increase the chance for osteonecrosis of the jaw with invasive dental procedures.   2. Discussion of findings with medical team and coordination of future medical and dental care as needed.  I spent in excess of  150 minutes during the conduct of this consultation and >50% of this time involved direct face-to-face encounter for counseling and/or coordination of the patient's care.    Lenn Cal, DDS

## 2018-12-03 ENCOUNTER — Other Ambulatory Visit: Payer: Self-pay | Admitting: Hematology

## 2018-12-03 ENCOUNTER — Telehealth: Payer: Self-pay

## 2018-12-03 LAB — MULTIPLE MYELOMA PANEL, SERUM
ALBUMIN SERPL ELPH-MCNC: 3.3 g/dL (ref 2.9–4.4)
ALBUMIN/GLOB SERPL: 0.6 — AB (ref 0.7–1.7)
ALPHA 1: 0.3 g/dL (ref 0.0–0.4)
Alpha2 Glob SerPl Elph-Mcnc: 0.7 g/dL (ref 0.4–1.0)
B-Globulin SerPl Elph-Mcnc: 0.9 g/dL (ref 0.7–1.3)
Gamma Glob SerPl Elph-Mcnc: 3.8 g/dL — ABNORMAL HIGH (ref 0.4–1.8)
Globulin, Total: 5.7 g/dL — ABNORMAL HIGH (ref 2.2–3.9)
IGM (IMMUNOGLOBULIN M), SRM: 29 mg/dL (ref 20–172)
IgA: 20 mg/dL — ABNORMAL LOW (ref 61–437)
IgG (Immunoglobin G), Serum: 5602 mg/dL — ABNORMAL HIGH (ref 700–1600)
M Protein SerPl Elph-Mcnc: 3.7 g/dL — ABNORMAL HIGH
TOTAL PROTEIN ELP: 9 g/dL — AB (ref 6.0–8.5)

## 2018-12-03 NOTE — Telephone Encounter (Signed)
LVM for patient to call back to get bone marrow biopsy scheduled.

## 2018-12-04 ENCOUNTER — Encounter: Payer: Self-pay | Admitting: Hematology

## 2018-12-04 NOTE — Telephone Encounter (Signed)
-----  Message from Gardenia Phlegm, NP sent at 12/03/2018 10:23 AM EST ----- Regarding: FW: Bone marrow biopsy.  ----- Message ----- From: [name], MD Sent: 12/03/2018  10:05 AM EST To: Gardenia Phlegm, NP Subject: Bone marrow biopsy.                            Hi Danny Juarez,  How are you? I saw Danny Juarez last week for likely new multiple myeloma. He has clinical features of multiple myeloma. Would you be able to do his bone marrow biopsy in the next week or two? If you are busy, no worries and I will try to get it scheduled through radiology.  Thank you for your help!  Vedia Coffer

## 2018-12-04 NOTE — Telephone Encounter (Signed)
Patient returned nurse call to schedule BMBX.  Appointment is for 12/12/18 at 7:30 and labs to be completed after procedure.  Schedule message sent.

## 2018-12-06 ENCOUNTER — Encounter: Payer: Self-pay | Admitting: Hematology

## 2018-12-06 ENCOUNTER — Inpatient Hospital Stay: Payer: 59

## 2018-12-06 ENCOUNTER — Inpatient Hospital Stay (HOSPITAL_BASED_OUTPATIENT_CLINIC_OR_DEPARTMENT_OTHER): Payer: 59 | Admitting: Hematology

## 2018-12-06 VITALS — BP 104/54 | HR 76 | Temp 97.9°F | Resp 18 | Ht 69.0 in | Wt 167.0 lb

## 2018-12-06 DIAGNOSIS — G629 Polyneuropathy, unspecified: Secondary | ICD-10-CM

## 2018-12-06 DIAGNOSIS — N179 Acute kidney failure, unspecified: Secondary | ICD-10-CM | POA: Diagnosis not present

## 2018-12-06 DIAGNOSIS — D649 Anemia, unspecified: Secondary | ICD-10-CM

## 2018-12-06 DIAGNOSIS — R937 Abnormal findings on diagnostic imaging of other parts of musculoskeletal system: Secondary | ICD-10-CM

## 2018-12-06 DIAGNOSIS — C9000 Multiple myeloma not having achieved remission: Secondary | ICD-10-CM | POA: Diagnosis not present

## 2018-12-06 DIAGNOSIS — D72819 Decreased white blood cell count, unspecified: Secondary | ICD-10-CM

## 2018-12-06 DIAGNOSIS — E871 Hypo-osmolality and hyponatremia: Secondary | ICD-10-CM

## 2018-12-06 LAB — CBC WITH DIFFERENTIAL (CANCER CENTER ONLY)
Abs Immature Granulocytes: 0.02 10*3/uL (ref 0.00–0.07)
BASOS ABS: 0 10*3/uL (ref 0.0–0.1)
Basophils Relative: 1 %
Eosinophils Absolute: 0.1 10*3/uL (ref 0.0–0.5)
Eosinophils Relative: 3 %
HCT: 28 % — ABNORMAL LOW (ref 39.0–52.0)
Hemoglobin: 9.8 g/dL — ABNORMAL LOW (ref 13.0–17.0)
Immature Granulocytes: 1 %
Lymphocytes Relative: 43 %
Lymphs Abs: 1.6 10*3/uL (ref 0.7–4.0)
MCH: 34.6 pg — ABNORMAL HIGH (ref 26.0–34.0)
MCHC: 35 g/dL (ref 30.0–36.0)
MCV: 98.9 fL (ref 80.0–100.0)
Monocytes Absolute: 0.3 10*3/uL (ref 0.1–1.0)
Monocytes Relative: 7 %
NEUTROS PCT: 45 %
NRBC: 0 % (ref 0.0–0.2)
Neutro Abs: 1.7 10*3/uL (ref 1.7–7.7)
Platelet Count: 171 10*3/uL (ref 150–400)
RBC: 2.83 MIL/uL — ABNORMAL LOW (ref 4.22–5.81)
RDW: 11.4 % — ABNORMAL LOW (ref 11.5–15.5)
WBC: 3.6 10*3/uL — AB (ref 4.0–10.5)

## 2018-12-06 LAB — CMP (CANCER CENTER ONLY)
ALT: 27 U/L (ref 0–44)
ANION GAP: 4 — AB (ref 5–15)
AST: 19 U/L (ref 15–41)
Albumin: 3.1 g/dL — ABNORMAL LOW (ref 3.5–5.0)
Alkaline Phosphatase: 62 U/L (ref 38–126)
BUN: 37 mg/dL — ABNORMAL HIGH (ref 8–23)
CO2: 25 mmol/L (ref 22–32)
Calcium: 8.4 mg/dL — ABNORMAL LOW (ref 8.9–10.3)
Chloride: 104 mmol/L (ref 98–111)
Creatinine: 1.69 mg/dL — ABNORMAL HIGH (ref 0.61–1.24)
GFR, EST AFRICAN AMERICAN: 49 mL/min — AB (ref >60)
GFR, Estimated: 42 mL/min — ABNORMAL LOW (ref >60)
Glucose, Bld: 103 mg/dL — ABNORMAL HIGH (ref 70–99)
Potassium: 4.6 mmol/L (ref 3.5–5.1)
Sodium: 133 mmol/L — ABNORMAL LOW (ref 135–145)
Total Bilirubin: 0.3 mg/dL (ref 0.3–1.2)
Total Protein: 9.4 g/dL — ABNORMAL HIGH (ref 6.5–8.1)

## 2018-12-06 NOTE — Progress Notes (Signed)
[facility] OFFICE PROGRESS NOTE  Patient Care Team: Patient, No Pcp Per as PCP - General (General Practice)  HEME/ONC OVERVIEW: 1. Likely IgG lambda multiple myeloma  -10/2018: MRI of the right shoulder for arm pain showed innumerable enhancing lesions throughout the right humerus, ribs and right scapula; no fractures -11/2018: baseline labs  -Hgb 10, Ca 12.2, Cr 2.89, albumin 2.9  -M-spike 3.7 g/dL, free lambda 7.8, monoclonal IgG lambda on IFE, quant IgG 5602, LDH 180, B2M 6.8  -PET on 12/11/2018   -Bone marrow biopsy scheduled on 12/12/2018  2. Hypercalcemia -S/p Xgeva x 1 for Ca 12.2; dental evaluation pending  ASSESSMENT & PLAN:   Suspected IgG lambda multiple myeloma  -Work-up notable for M-spike 3.7 g/dL, IFE positive for monoclonal IgG lambda -PET scheduled on 12/11/2018, bone marrow biopsy scheduled on 12/12/2018 -I reviewed with the patient in detail regarding the diagnosis of and potential 1st line treatment options for multiple myeloma  -If multiple myeloma is confirmed, the treatment regimen will depend on the patient's renal function (RVD vs. CyBorD) -Given the patient's age and good performance status, autologous stem cell transplant will also need to be considered; timing for referral to a transplant center TBD   Right lower extremity neuropathy -New onset, mild in severity; shock like sensation below the right knee  -No weakness or gait instability  -Etiology unclear; possibly due to nerve compression -PET scheduled as above -We will monitor it for now   Severe hypercalcemia -Secondary to myeloma bone involvement -Ca 12.2 last week, requiring Xgeva x 1  -Ca 8.4 today, improving  -The patient was referred to Dr. Enrique Sack of dental medicine for dental clearance prior to continuing additional Xgeva -Per dental medicine, Delton See should be held while the patient is being evaluated for extraction procedures with alveoloplasty and pre-prosthetic surgery as  needed -I have recommended the patient to take OTC Ca-Vit D supplement to avoid hypocalcemia due to Xgeva   AKI -Secondary to myeloma-related renal injury -Cr 1.69 today, improving from 2.89 last week after IV fluid  -I encouraged the patient to maintain adequate oral hydration -We will monitor it closely for now   Normocytic anemia -Secondary to suspected bone marrow involvement by myeloma  -B12, folate normal -Hgb 9.8 today, stable  -We will monitor it for now  Leukopenia -Secondary to suspected bone marrow involvement by myeloma  -Patient denies any symptoms of infection -WBC 3.6k today, stable  -We will monitor it for now  Hyponatremia -Na 133 today, stable -Encourage oral hydration as above   Orders Placed This Encounter  Procedures  . CBC with Differential (Cancer Center Only)    Standing Status:   Future    Standing Expiration Date:   01/10/2020  . CMP ([facility] only)    Standing Status:   Future    Standing Expiration Date:   01/10/2020    All questions were answered. The patient knows to call the clinic with any problems, questions or concerns. No barriers to learning was detected.  Return in 2 weeks for labs and clinic follow-up on treatment options.  [name], MD 12/06/2018 11:46 AM  CHIEF COMPLAINT: "I am doing fine* "  INTERVAL HISTORY: Mr. Hennon returns to clinic for follow-up of suspected multiple myeloma.  Patient reports that since last visit, he has been drinking plenty of fluids and maintaining adequate urination.  He reports that over the past few days, he developed a new onset, intermittent, shocklike sensation in the right lower extremity, primarily  below the right knee.  He denies any right lower extremity weakness or gait instability.  It was very bothersome last night, but improved today.  He denies any fever, chill, night sweats, weight change, chest pain, dyspnea, abdominal pain, nausea, vomiting, or diarrhea.  SUMMARY OF ONCOLOGIC  HISTORY:   Multiple myeloma ([facility])   11/11/2018 Imaging    MRI right shoulder for arm pain showed innumerable enhancing lesions throughout the right humerus, ribs and right scapula; no fractures    11/29/2018 Miscellaneous    Baseline labs -Hgb 10, Ca 12.2, Cr 2.89, albumin 2.9 -M-spike 3.7 g/dL, free lambda 7.8, monoclonal IgG lambda on IFE, quant IgG 5602, LDH 180, B2M 6.8     REVIEW OF SYSTEMS:   Constitutional: ( - ) fevers, ( - )  chills , ( - ) night sweats Eyes: ( - ) blurriness of vision, ( - ) double vision, ( - ) watery eyes Ears, nose, mouth, throat, and face: ( - ) mucositis, ( - ) sore throat Respiratory: ( - ) cough, ( - ) dyspnea, ( - ) wheezes Cardiovascular: ( - ) palpitation, ( - ) chest discomfort, ( - ) lower extremity swelling Gastrointestinal:  ( - ) nausea, ( - ) heartburn, ( - ) change in bowel habits Skin: ( - ) abnormal skin rashes Lymphatics: ( - ) new lymphadenopathy, ( - ) easy bruising Neurological: ( - ) numbness, ( - ) tingling, ( - ) new weaknesses Behavioral/Psych: ( - ) mood change, ( - ) new changes  All other systems were reviewed with the patient and are negative.  I have reviewed the past medical history, past surgical history, social history and family history with the patient and they are unchanged from previous note.  ALLERGIES:  has No Known Allergies.  MEDICATIONS:  Current Outpatient Medications  Medication Sig Dispense Refill  . Denosumab (XGEVA [place]ister) Inject into the skin every 28 (twenty-eight) days.     . ferrous sulfate 325 (65 FE) MG EC tablet Take 325 mg by mouth 3 (three) times daily with meals.    . Multiple Vitamin (MULTIVITAMIN WITH MINERALS) TABS Take 1 tablet by mouth daily.     No current facility-administered medications for this visit.     PHYSICAL EXAMINATION: ECOG PERFORMANCE STATUS: 1 - Symptomatic but completely ambulatory  Today's Vitals   12/06/18 1123  BP: (!) 104/54  Pulse: 76  Resp: 18  Temp: 97.9 F  (36.6 C)  TempSrc: Oral  SpO2: 100%  Weight: 167 lb (75.8 kg)  Height: '5\' 9"'  (1.753 m)  PainSc: 0-No pain   Body mass index is 24.66 kg/m.  Filed Weights   12/06/18 1123  Weight: 167 lb (75.8 kg)    GENERAL: alert, no distress and comfortable SKIN: skin color, texture, turgor are normal, no rashes or significant lesions EYES: conjunctiva are pink and non-injected, sclera clear OROPHARYNX: no exudate, no erythema; lips, buccal mucosa, and tongue normal  NECK: supple, non-tender LYMPH:  no palpable lymphadenopathy in the cervical or axillary  LUNGS: clear to auscultation and percussion with normal breathing effort HEART: regular rate & rhythm and no murmurs and no lower extremity edema ABDOMEN: soft, non-tender, non-distended, normal bowel sounds Musculoskeletal: no cyanosis of digits and no clubbing, slightly wide-based gait due to arthritis  PSYCH: alert & oriented x 3, fluent speech  LABORATORY DATA:  I have reviewed the data as listed    Component Value Date/Time   NA 133 (L) 12/06/2018 1050  K 4.6 12/06/2018 1050   CL 104 12/06/2018 1050   CO2 25 12/06/2018 1050   GLUCOSE 103 (H) 12/06/2018 1050   BUN 37 (H) 12/06/2018 1050   CREATININE 1.69 (H) 12/06/2018 1050   CALCIUM 8.4 (L) 12/06/2018 1050   PROT 9.4 (H) 12/06/2018 1050   ALBUMIN 3.1 (L) 12/06/2018 1050   AST 19 12/06/2018 1050   ALT 27 12/06/2018 1050   ALKPHOS 62 12/06/2018 1050   BILITOT 0.3 12/06/2018 1050   GFRNONAA 42 (L) 12/06/2018 1050   GFRAA 49 (L) 12/06/2018 1050    No results found for: SPEP, UPEP  Lab Results  Component Value Date   WBC 3.6 (L) 12/06/2018   NEUTROABS 1.7 12/06/2018   HGB 9.8 (L) 12/06/2018   HCT 28.0 (L) 12/06/2018   MCV 98.9 12/06/2018   PLT 171 12/06/2018      Chemistry      Component Value Date/Time   NA 133 (L) 12/06/2018 1050   K 4.6 12/06/2018 1050   CL 104 12/06/2018 1050   CO2 25 12/06/2018 1050   BUN 37 (H) 12/06/2018 1050   CREATININE 1.69 (H)  12/06/2018 1050      Component Value Date/Time   CALCIUM 8.4 (L) 12/06/2018 1050   ALKPHOS 62 12/06/2018 1050   AST 19 12/06/2018 1050   ALT 27 12/06/2018 1050   BILITOT 0.3 12/06/2018 1050

## 2018-12-09 ENCOUNTER — Encounter: Payer: Self-pay | Admitting: *Deleted

## 2018-12-11 ENCOUNTER — Ambulatory Visit (HOSPITAL_COMMUNITY)
Admission: RE | Admit: 2018-12-11 | Discharge: 2018-12-11 | Disposition: A | Discharge status: Home / Self Care | Payer: 59 | Source: Ambulatory Visit | Attending: Hematology | Admitting: Hematology

## 2018-12-11 DIAGNOSIS — R937 Abnormal findings on diagnostic imaging of other parts of musculoskeletal system: Secondary | ICD-10-CM

## 2018-12-11 LAB — GLUCOSE, CAPILLARY: Glucose-Capillary: 97 mg/dL (ref 70–99)

## 2018-12-11 MED ORDER — FLUDEOXYGLUCOSE F - 18 (FDG) INJECTION
8.3400 | Freq: Once | INTRAVENOUS | Status: AC | PRN
  Administered 2018-12-11: 8.34 via INTRAVENOUS

## 2018-12-12 ENCOUNTER — Inpatient Hospital Stay (HOSPITAL_BASED_OUTPATIENT_CLINIC_OR_DEPARTMENT_OTHER): Payer: 59 | Admitting: Adult Health

## 2018-12-12 ENCOUNTER — Inpatient Hospital Stay: Payer: 59

## 2018-12-12 VITALS — BP 117/76 | HR 74 | Temp 97.7°F | Resp 16

## 2018-12-12 DIAGNOSIS — C9000 Multiple myeloma not having achieved remission: Secondary | ICD-10-CM

## 2018-12-12 LAB — CBC WITH DIFFERENTIAL (CANCER CENTER ONLY)
Abs Immature Granulocytes: 0.01 10*3/uL (ref 0.00–0.07)
Basophils Absolute: 0 10*3/uL (ref 0.0–0.1)
Basophils Relative: 0 %
Eosinophils Absolute: 0.1 10*3/uL (ref 0.0–0.5)
Eosinophils Relative: 4 %
HEMATOCRIT: 27.1 % — AB (ref 39.0–52.0)
Hemoglobin: 9.5 g/dL — ABNORMAL LOW (ref 13.0–17.0)
Immature Granulocytes: 0 %
Lymphocytes Relative: 43 %
Lymphs Abs: 1.4 10*3/uL (ref 0.7–4.0)
MCH: 34.9 pg — ABNORMAL HIGH (ref 26.0–34.0)
MCHC: 35.1 g/dL (ref 30.0–36.0)
MCV: 99.6 fL (ref 80.0–100.0)
Monocytes Absolute: 0.3 10*3/uL (ref 0.1–1.0)
Monocytes Relative: 10 %
Neutro Abs: 1.4 10*3/uL — ABNORMAL LOW (ref 1.7–7.7)
Neutrophils Relative %: 43 %
Platelet Count: 150 10*3/uL (ref 150–400)
RBC: 2.72 MIL/uL — ABNORMAL LOW (ref 4.22–5.81)
RDW: 11.9 % (ref 11.5–15.5)
WBC Count: 3.2 10*3/uL — ABNORMAL LOW (ref 4.0–10.5)
nRBC: 0 % (ref 0.0–0.2)

## 2018-12-12 NOTE — Progress Notes (Signed)
Pt BMBX site clean dry and intact 30 minutes post procedure.  VSS.  Pt instructed to go to lab for blood draw

## 2018-12-12 NOTE — Progress Notes (Signed)
INDICATION:Multiple myeloma  Bone Marrow Biopsy and Aspiration Procedure Note   Informed consent was obtained and potential risks including bleeding, infection and pain were reviewed with the patient.  The patient's name, date of birth, identification, consent and allergies were verified prior to the start of procedure and time out was performed.  The left posterior iliac crest was chosen as the site of biopsy.  The skin was prepped with ChloraPrep.   5 cc of 2% lidocaine was used to provide local anaesthesia.   10 cc of bone marrow aspirate was obtained followed by 1cm biopsy.  Pressure was applied to the biopsy site and bandage was placed over the biopsy site. Patient was made to lie on the back for 15 mins prior to discharge.  The procedure was tolerated well. COMPLICATIONS: None BLOOD LOSS: none The patient was discharged home in stable condition with a 1 week follow up to review results.  Patient was provided with post bone marrow biopsy instructions and instructed to call if there was any bleeding or worsening pain.  Specimens sent for flow cytometry, cytogenetics and additional studies.  Signed Scot Dock, NP

## 2018-12-12 NOTE — Patient Instructions (Signed)

## 2018-12-16 NOTE — Progress Notes (Addendum)
[facility] OFFICE PROGRESS NOTE  Patient Care Team: Patient, No Pcp Per as PCP - General (General Practice)  HEME/ONC OVERVIEW: 1. IgG lambda multiple myeloma  -10/2018: MRI of the right shoulder for arm pain showed innumerable enhancing lesions throughout the right humerus, ribs and right scapula; no fractures -11/2018: baseline labs  -Hgb 10, Ca 12.2, Cr 2.89, albumin 2.9  -M-spike 3.7 g/dL, free lambda 7.8, monoclonal IgG lambda on IFE, quant IgG 5602, LDH 180, B2M 6.8  -PET showed innumerable lytic lesions involving the axial and proximal appendicular skeleton and calvarium; no plasmacytoma   -Bone marrow biopsy showed normocellular marrow with plasma cell neoplasm (~64% of all cells); myeloma FISH showed trisomy 11 and gain of ATM gene   2. Myeloma-associated hypercalcemia -S/p Xgeva x 1 for Ca 12.2; dental evaluation pending  TREATMENT REGIMEN:  01/02/2019 - present (tentatively): q21day RVd; Revlimid dose reduced to 22m daily due to fluctuating renal function   ASSESSMENT & PLAN:   IgG lambda multiple myeloma  -I reviewed the patient's pathology report from the recent bone marrow biopsy, which showed plasma cell neoplasm consisting of approximately 64% of all cells in the bone marrow, consistent with multiple myeloma -I also independently reviewed the recent radiologic images of PET, and agree with the findings as documented in the -In summary, PET showed innumerable lytic lesions involving the axial and proximal appendicular skeleton as well as the calvarium; there was no plasmacytoma -I reviewed with the patient in detail regarding the diagnosis of multiple myeloma and potential first-line treatment options -As his renal function has stabilized after correcting hypercalcemia, I recommend proceeding with RVD, q21day cycle, Revlimid dose reduced to 268mdaily due to fluctuating renal function and recent AKI -I discussed with patient in detail regarding some of the  benefits and potential toxicities of RVD -Some of the short term & long term side-effects included, though not limited to, risk of fatigue, weight loss, risk of allergic reactions, pancytopenia, permanent damage to nerve function, life-threatening infections, need for transfusions of blood products, change in bowel habits, admission to hospital for various reasons, and risks of death.  -The patient is aware that the response rates discussed earlier is not guaranteed.   -After a long discussion, patient made an informed decision to proceed with the prescribed plan of care.  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 KuEnrique Sackf dental medicine, who strongly recommended the patient to meet with Dr. BoAlinda Moneyo discuss treatment plan prior to resuming XgDelton Seeas additional XgDelton Seeould make future dental procedures high risk and the [facility] -After lengthy discussion, patient agreed to meet with his dentist -Xgeva on hold until completion of dental evaluation  -On OTC Ca-Vit D supplement BID   Hypocalcemia -S/p Xgeva x1 for calcium of 12.2 with resolution of hypercalcemia -Ca 8.6 today, stable -Ca-Vit D supplement as above  CKD  -Secondary to myeloma-related renal injury -Cr 1.14 today, improving; Cr peaked at 2.89 -I  encouraged the patient to maintain adequate oral hydration -We will monitor it closely for now   Normocytic anemia -Secondary to bone marrow involvement by myeloma -Hgb 9.4 today, stable -B12 and folate normal -We will monitor for now  Leukopenia -Secondary to bone marrow involvement by myeloma -WBC 2.9k, stable -Patient denies any symptoms of infection -We will monitor for now  Goals of care discussion -I discussed with the patient that the goal of treatment is for palliative intent, NOT curative -Furthermore, given his age, if he has a good response to RVd, autologous SCT is a very reasonable option to consider   Orders Placed This Encounter  Procedures  . CBC with Differential (Cancer Center Only)    Standing Status:   Standing    Number of Occurrences:   20    Standing Expiration Date:   12/21/2019  . CMP ([facility] only)    Standing Status:   Standing    Number of Occurrences:   20    Standing Expiration Date:   12/21/2019  . PHYSICIAN COMMUNICATION ORDER    Order aspirin 81-321m OR Coumadin for thromboembolic prophylaxis via "add orders". Target Coumadin INR = 2-3.    All questions were answered. The patient knows to call the clinic with any problems, questions or concerns. No barriers to learning was detected.  A total of more than 40 minutes were spent face-to-face with the patient during this encounter and over half of that time was spent on counseling and coordination of care as outlined above.   Tentatively return on 01/16/2019 for labs and clinic appt.  YTish Men MD 12/20/2018 11:58 AM  CHIEF COMPLAINT: "I am doing okay "  INTERVAL HISTORY: Mr. HSpornreturns to clinic for follow-up of recent PET scan.  Patient reports that he has mild intermittent pain in the shoulders, but has not required any opioid pain medication.  He is otherwise doing well, and denies any new complaint today.  SUMMARY OF ONCOLOGIC HISTORY:   [place]   11/11/2018  Imaging    MRI right shoulder for arm pain showed innumerable enhancing lesions throughout the right humerus, ribs and right scapula; no fractures    11/29/2018 Miscellaneous    Baseline labs -Hgb 10, Ca 12.2, Cr 2.89, albumin 2.9 -M-spike 3.7 g/dL, free lambda 7.8, monoclonal IgG lambda on IFE, quant IgG 5602, LDH 180, B2M 6.8    01/02/2019 -  Chemotherapy    The patient had bortezomib SQ (VELCADE) chemo injection 2.5 mg, 1.3 mg/m2 = 2.5 mg, Subcutaneous,  Once, 0 of 4 cycles  for chemotherapy treatment.      REVIEW OF SYSTEMS:   Constitutional: ( - ) fevers, ( - )  chills , ( - ) night sweats Eyes: ( - ) blurriness of vision, ( - ) double vision, ( - ) watery eyes Ears, nose, mouth, throat, and face: ( - ) mucositis, ( - ) sore throat Respiratory: ( - ) cough, ( - ) dyspnea, ( - ) wheezes Cardiovascular: ( - ) palpitation, ( - ) chest discomfort, ( - ) lower extremity swelling Gastrointestinal:  ( - ) nausea, ( - ) heartburn, ( - ) change in bowel habits Skin: ( - ) abnormal skin rashes Lymphatics: ( - ) new lymphadenopathy, ( - ) easy bruising Neurological: ( - ) numbness, ( - ) tingling, ( - ) new weaknesses Behavioral/Psych: ( - )  mood change, ( - ) new changes  All other systems were reviewed with the patient and are negative.  I have reviewed the past medical history, past surgical history, social history and family history with the patient and they are unchanged from previous note.  ALLERGIES:  has No Known Allergies.  MEDICATIONS:  Current Outpatient Medications  Medication Sig Dispense Refill  . ferrous sulfate 325 (65 FE) MG EC tablet Take 325 mg by mouth 3 (three) times daily with meals.    . Multiple Vitamin (MULTIVITAMIN WITH MINERALS) TABS Take 1 tablet by mouth daily.    Marland Kitchen acyclovir (ZOVIRAX) 400 MG tablet Take 1 tablet (400 mg total) by mouth 2 (two) times daily. 60 tablet 3  . dexamethasone (DECADRON) 4 MG tablet Take 10 tablets (40 mg) on days 1, 8, and 15 of  chemo. Repeat every 21 days. 30 tablet 3  . lenalidomide (REVLIMID) 20 MG capsule Take one capsule daily on days 1-14 every 21 days. 14 capsule 0  . lidocaine-prilocaine (EMLA) cream Apply to affected area once 30 g 3  . LORazepam (ATIVAN) 0.5 MG tablet Take 1 tablet (0.5 mg total) by mouth every 6 (six) hours as needed (Nausea or vomiting). 30 tablet 0  . ondansetron (ZOFRAN) 8 MG tablet Take 1 tablet (8 mg total) by mouth 2 (two) times daily as needed (Nausea or vomiting). 30 tablet 1  . prochlorperazine (COMPAZINE) 10 MG tablet Take 1 tablet (10 mg total) by mouth every 6 (six) hours as needed (Nausea or vomiting). 30 tablet 1   No current facility-administered medications for this visit.     PHYSICAL EXAMINATION: ECOG PERFORMANCE STATUS: 1 - Symptomatic but completely ambulatory  Today's Vitals   12/20/18 1014 12/20/18 1023  BP: (!) 106/56   Pulse: 70   Resp: 18   Temp: (!) 97.3 F (36.3 C)   TempSrc: Oral   SpO2: 100%   Weight: 170 lb 6.4 oz (77.3 kg)   Height: _0  (1.753 m)   PainSc: 0-No pain 0-No pain   Body mass index is 25.16 kg/m.  Filed Weights   12/20/18 1014  Weight: 170 lb 6.4 oz (77.3 kg)    GENERAL: alert, no distress and comfortable SKIN: skin color, texture, turgor are normal, no rashes or significant lesions EYES: conjunctiva are pink and non-injected, sclera clear OROPHARYNX: no exudate, no erythema; lips, buccal mucosa, and tongue normal  NECK: supple, non-tender LYMPH:  no palpable lymphadenopathy in the cervical or axillary  LUNGS: clear to auscultation and percussion with normal breathing effort HEART: regular rate & rhythm and no murmurs and no lower extremity edema ABDOMEN: soft, non-tender, non-distended, normal bowel sounds Musculoskeletal: no cyanosis of digits and no clubbing  PSYCH: alert & oriented x 3, fluent speech NEURO: no focal motor/sensory deficits  LABORATORY DATA:  I have reviewed the data as listed    Component Value  Date/Time   NA 135 12/20/2018 0958   K 4.5 12/20/2018 0958   CL 105 12/20/2018 0958   CO2 28 12/20/2018 0958   GLUCOSE 101 (H) 12/20/2018 0958   BUN 27 (H) 12/20/2018 0958   CREATININE 1.14 12/20/2018 0958   CALCIUM 8.6 (L) 12/20/2018 0958   PROT 9.3 (H) 12/20/2018 0958   ALBUMIN 3.0 (L) 12/20/2018 0958   AST 16 12/20/2018 0958   ALT 18 12/20/2018 0958   ALKPHOS 59 12/20/2018 0958   BILITOT 0.4 12/20/2018 0958   GFRNONAA >60 12/20/2018 0958   GFRAA >60 12/20/2018 5809  No results found for: SPEP, UPEP  Lab Results  Component Value Date   WBC 2.9 (L) 12/20/2018   NEUTROABS 1.5 (L) 12/20/2018   HGB 9.4 (L) 12/20/2018   HCT 27.1 (L) 12/20/2018   MCV 101.1 (H) 12/20/2018   PLT 165 12/20/2018      Chemistry      Component Value Date/Time   NA 135 12/20/2018 0958   K 4.5 12/20/2018 0958   CL 105 12/20/2018 0958   CO2 28 12/20/2018 0958   BUN 27 (H) 12/20/2018 0958   CREATININE 1.14 12/20/2018 0958      Component Value Date/Time   CALCIUM 8.6 (L) 12/20/2018 0958   ALKPHOS 59 12/20/2018 0958   AST 16 12/20/2018 0958   ALT 18 12/20/2018 0958   BILITOT 0.4 12/20/2018 0958       RADIOGRAPHIC STUDIES: I have personally reviewed the radiological images as listed below and agreed with the findings in the report. Nm Pet Image Initial (pi) Whole Body  Result Date: 12/11/2018 CLINICAL DATA:  Initial treatment strategy for multiple myeloma. EXAM: NUCLEAR MEDICINE PET WHOLE BODY TECHNIQUE: 8.34 mCi F-18 FDG was injected intravenously. Full-ring PET imaging was performed from the skull base to thigh after the radiotracer. CT data was obtained and used for attenuation correction and anatomic localization. Fasting blood glucose: 97 mg/dl COMPARISON:  None FINDINGS: Mediastinal blood pool activity: SUV max 2.29 HEAD/NECK: No hypermetabolic activity in the scalp. No hypermetabolic cervical lymph nodes. Incidental CT findings: none CHEST: No enlarged or hypermetabolic axillary or  supraclavicular lymph nodes. High right paratracheal lymph node measures 0.8 cm and has an SUV max of 3.66. Mild increased uptake within the right hilar region has an SUV max of 4.74. No hypermetabolic pulmonary nodules. Incidental CT findings: Calcified granuloma identified in the right lower lobe. Aortic atherosclerosis with multi vessel coronary artery atherosclerotic calcifications. ABDOMEN/PELVIS: No abnormal hypermetabolic activity within the liver, pancreas, adrenal glands, or spleen. No hypermetabolic lymph nodes in the abdomen or pelvis. 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 Aortic atherosclerosis. SKELETON: There are innumerable lucent lesions throughout the visualized axial skeleton compatible with suspected diagnosis of multiple myeloma. Lesions are too numerous to count. Mild diffuse heterogeneous uptake identified on the corresponding PET images. Small focal area of increased uptake above background activity localizes to the right transverse process of the C5 vertebra within SUV max 4.71. Additional small focus area of increased uptake above background activity localizes to the posterolateral aspect of the left fourth rib with an SUV max of 4.34. Incidental CT findings: none EXTREMITIES: Diffuse lytic bone metastases are identified throughout the proximal appendicular skeleton and throughout the calvarium. Lesions are too numerous to count. Corresponding areas of mildly increased radiotracer uptake identified on the PET images. For example, within the mid shaft of left humerus there is a small focus of increased uptake localizing to lytic lesion within SUV max of 2.60. Corresponding to a lytic lesion within the mid shaft of the right femur is a small focus of uptake within SUV max of 3.16. I CT this 1 PET scan the Incidental CT findings: none IMPRESSION: 1. Innumerable lytic lesions are identified involving the axial and proximal appendicular skeleton and calvarium  compatible with multiple myeloma. There is corresponding heterogeneous mild radiotracer uptake on the PET images. More focal areas of mild radiotracer uptake above background activity is noted with SUV max is ranging between 2.6 and 4.7. No large hypermetabolic plasmacytoma identified. 2. Mild hypermetabolism associated with right  paratracheal and right hilar lymph nodes. 3. Horseshoe kidney 4.  Aortic Atherosclerosis (ICD10-I70.0). 5. Coronary artery atherosclerotic calcifications. Electronically Signed   By: Kerby Moors M.D.   On: 12/11/2018 09:56

## 2018-12-19 ENCOUNTER — Other Ambulatory Visit: Payer: Self-pay

## 2018-12-19 ENCOUNTER — Ambulatory Visit: Payer: Self-pay | Admitting: Hematology

## 2018-12-20 ENCOUNTER — Encounter: Payer: Self-pay | Admitting: Hematology

## 2018-12-20 ENCOUNTER — Inpatient Hospital Stay: Payer: 59

## 2018-12-20 ENCOUNTER — Encounter: Payer: Self-pay | Admitting: *Deleted

## 2018-12-20 ENCOUNTER — Other Ambulatory Visit: Payer: Self-pay | Admitting: Hematology

## 2018-12-20 ENCOUNTER — Other Ambulatory Visit: Payer: Self-pay | Admitting: *Deleted

## 2018-12-20 ENCOUNTER — Inpatient Hospital Stay (HOSPITAL_BASED_OUTPATIENT_CLINIC_OR_DEPARTMENT_OTHER): Payer: 59 | Admitting: Hematology

## 2018-12-20 ENCOUNTER — Other Ambulatory Visit: Payer: Self-pay

## 2018-12-20 VITALS — BP 106/56 | HR 70 | Temp 97.3°F | Resp 18 | Ht 69.0 in | Wt 170.4 lb

## 2018-12-20 DIAGNOSIS — C9000 Multiple myeloma not having achieved remission: Secondary | ICD-10-CM

## 2018-12-20 DIAGNOSIS — D72819 Decreased white blood cell count, unspecified: Secondary | ICD-10-CM

## 2018-12-20 DIAGNOSIS — D649 Anemia, unspecified: Secondary | ICD-10-CM | POA: Diagnosis not present

## 2018-12-20 DIAGNOSIS — N189 Chronic kidney disease, unspecified: Secondary | ICD-10-CM | POA: Diagnosis not present

## 2018-12-20 DIAGNOSIS — Z7189 Other specified counseling: Secondary | ICD-10-CM

## 2018-12-20 LAB — CBC WITH DIFFERENTIAL (CANCER CENTER ONLY)
Abs Immature Granulocytes: 0.02 10*3/uL (ref 0.00–0.07)
Basophils Absolute: 0 10*3/uL (ref 0.0–0.1)
Basophils Relative: 0 %
Eosinophils Absolute: 0.1 10*3/uL (ref 0.0–0.5)
Eosinophils Relative: 3 %
HCT: 27.1 % — ABNORMAL LOW (ref 39.0–52.0)
Hemoglobin: 9.4 g/dL — ABNORMAL LOW (ref 13.0–17.0)
Immature Granulocytes: 1 %
Lymphocytes Relative: 37 %
Lymphs Abs: 1.1 10*3/uL (ref 0.7–4.0)
MCH: 35.1 pg — ABNORMAL HIGH (ref 26.0–34.0)
MCHC: 34.7 g/dL (ref 30.0–36.0)
MCV: 101.1 fL — ABNORMAL HIGH (ref 80.0–100.0)
Monocytes Absolute: 0.2 10*3/uL (ref 0.1–1.0)
Monocytes Relative: 7 %
Neutro Abs: 1.5 10*3/uL — ABNORMAL LOW (ref 1.7–7.7)
Neutrophils Relative %: 52 %
Platelet Count: 165 10*3/uL (ref 150–400)
RBC: 2.68 MIL/uL — ABNORMAL LOW (ref 4.22–5.81)
RDW: 12.1 % (ref 11.5–15.5)
WBC: 2.9 10*3/uL — AB (ref 4.0–10.5)
nRBC: 0 % (ref 0.0–0.2)

## 2018-12-20 LAB — CMP (CANCER CENTER ONLY)
ALT: 18 U/L (ref 0–44)
AST: 16 U/L (ref 15–41)
Albumin: 3 g/dL — ABNORMAL LOW (ref 3.5–5.0)
Alkaline Phosphatase: 59 U/L (ref 38–126)
Anion gap: 2 — ABNORMAL LOW (ref 5–15)
BILIRUBIN TOTAL: 0.4 mg/dL (ref 0.3–1.2)
BUN: 27 mg/dL — ABNORMAL HIGH (ref 8–23)
CO2: 28 mmol/L (ref 22–32)
Calcium: 8.6 mg/dL — ABNORMAL LOW (ref 8.9–10.3)
Chloride: 105 mmol/L (ref 98–111)
Creatinine: 1.14 mg/dL (ref 0.61–1.24)
GFR, Est AFR Am: >60 mL/min (ref >60)
Glucose, Bld: 101 mg/dL — ABNORMAL HIGH (ref 70–99)
Potassium: 4.5 mmol/L (ref 3.5–5.1)
Sodium: 135 mmol/L (ref 135–145)
Total Protein: 9.3 g/dL — ABNORMAL HIGH (ref 6.5–8.1)

## 2018-12-20 MED ORDER — ONDANSETRON HCL 8 MG PO TABS: 8.0000 mg | ORAL_TABLET | Freq: Two times a day (BID) | ORAL | 1 refills | Status: DC | PRN

## 2018-12-20 MED ORDER — LENALIDOMIDE 20 MG PO CAPS: ORAL_CAPSULE | ORAL | 0 refills | Status: DC

## 2018-12-20 MED ORDER — LIDOCAINE-PRILOCAINE 2.5-2.5 % EX CREA: TOPICAL_CREAM | CUTANEOUS | 3 refills | Status: DC

## 2018-12-20 MED ORDER — PROCHLORPERAZINE MALEATE 10 MG PO TABS: 10.0000 mg | ORAL_TABLET | Freq: Four times a day (QID) | ORAL | 1 refills | Status: DC | PRN

## 2018-12-20 MED ORDER — ACYCLOVIR 400 MG PO TABS: 400.0000 mg | ORAL_TABLET | Freq: Two times a day (BID) | ORAL | 3 refills | Status: DC

## 2018-12-20 MED ORDER — LORAZEPAM 0.5 MG PO TABS: 0.5000 mg | ORAL_TABLET | Freq: Four times a day (QID) | ORAL | 0 refills | Status: DC | PRN

## 2018-12-20 MED ORDER — DEXAMETHASONE 4 MG PO TABS: ORAL_TABLET | ORAL | 3 refills | Status: DC

## 2018-12-20 NOTE — Progress Notes (Signed)
START ON PATHWAY REGIMEN - Multiple Myeloma and Other Plasma Cell Dyscrasias     A cycle is every 21 days:     Bortezomib      Lenalidomide      Dexamethasone   **Always confirm dose/schedule in your pharmacy ordering system**  Patient Characteristics: Newly Diagnosed, Transplant Eligible, Unknown or Awaiting Test Results R-ISS Staging: Unknown Disease Classification: Newly Diagnosed Is Patient Eligible for Transplant<= Transplant Eligible Risk Status: Awaiting Test Results Intent of Therapy: Non-Curative / Palliative Intent, Discussed with Patient 

## 2018-12-20 NOTE — Progress Notes (Signed)
Per Dr. Maylon Peppers, I enrolled the patient with [employer] for Revlimid. I did the enrollment online. Patient has a copy of the original paperwork and the Revlimid education book. I reviewed the sections of the book with him and the paperwork. All questions were answered. Instructed him to call this office if he has any further questions or concerns. He verbalized understanding.

## 2018-12-23 ENCOUNTER — Encounter (HOSPITAL_COMMUNITY): Payer: Self-pay | Admitting: Hematology

## 2018-12-24 ENCOUNTER — Other Ambulatory Visit: Payer: Self-pay | Admitting: *Deleted

## 2018-12-24 DIAGNOSIS — C9000 Multiple myeloma not having achieved remission: Secondary | ICD-10-CM

## 2018-12-24 MED ORDER — LENALIDOMIDE 20 MG PO CAPS: ORAL_CAPSULE | ORAL | 0 refills | Status: DC

## 2018-12-25 ENCOUNTER — Telehealth: Payer: Self-pay | Admitting: Hematology

## 2018-12-25 NOTE — Telephone Encounter (Signed)
Received call in reference to referral to [facility] for evaluation of autologous stem cell transplant; Dr. Reola Calkins has been scheduled for 01/27/19 at 12:40 pm per office. They will contact MD to see if there can be a sooner appt.

## 2018-12-27 ENCOUNTER — Telehealth: Payer: Self-pay | Admitting: Pharmacy Technician

## 2018-12-27 ENCOUNTER — Telehealth: Payer: Self-pay | Admitting: Pharmacist

## 2018-12-27 NOTE — Telephone Encounter (Addendum)
Oral Oncology Patient Advocate Encounter  Prior Authorization for Revlimid has been approved.    PA# [id number] Effective dates: 12/27/2018 through 12/28/2019  This information has been forwarded to [facility].  Patients copay is $35.00.  Oral Oncology Clinic will continue to follow.   [place] Patient [place] Phone [phone] Fax [phone] 12/27/2018 11:53 AM

## 2018-12-27 NOTE — Telephone Encounter (Signed)
Oral Oncology Patient Advocate Encounter  Received notification from [facility] that prior authorization for Revlimid is required.  PA submitted on CoverMyMeds Key [id number] Status is pending  Oral Oncology Clinic will continue to follow.  [place] Patient [place] Phone [phone] Fax [phone] 12/27/2018 11:21 AM

## 2018-12-27 NOTE — Telephone Encounter (Signed)
Oral Chemotherapy Pharmacist Encounter   Patient with recent diagnosis of IgG lambda multiple myeloma with plan to initiate RVd therapy. Lenalidomide (Revlimid) prescription was sent to [facility], but this pharmacy is out of network. [facility] transferred prescription to [place] [facility] for dispensing of medication. Oral Chemotherapy [facility] faxed supportive information to [place] [facility].   Jalene Mullet, PharmD PGY2 Hematology/ Oncology Pharmacy Resident [facility] [phone] 12/27/2018 10:11 AM

## 2018-12-27 NOTE — Telephone Encounter (Signed)
Oral Oncology Pharmacist Encounter  Received new prescription for lenalidomide (Revlimid) for the treatment of IgG lambda multiple myeloma in conjunction with bortezomib and dexamethsone (RVd as 21 day cycle), planned duration until disease progression or unacceptable toxicity.  Labs from 12/20/2018 in Epic assessed, ok for treatment. Prescription dose and frequency assessed. Agree with dose reduction of lenalidomide for fluctuating renal function/ recent AKI with continued monitoring. Patient has also been placed on acyclovir for VZV prophylaxis with use of bortezomib. Would recommend also starting patient on low-dose aspirin 54m PO daily for increased risk of VTE with lenalidomide and remote history of PE in 1996.  Current medication list in Epic reviewed, no DDIs with lenalidomide identified at this time.  [facility].   Oral Oncology Clinic will continue to follow for insurance authorization, copayment issues, initial counseling and start date.  SJalene Mullet PharmD PGY2 Hematology/ Oncology Pharmacy Resident [phone]/05/2019 9:09 AM

## 2018-12-27 NOTE — Telephone Encounter (Signed)
Thank you for calling the patient. I agree with ppx with ASA as outlined.  Dr. Maylon Peppers

## 2019-01-01 NOTE — Telephone Encounter (Signed)
[facility] is reaching out to patient to set up delivery of Revlimid.  Will continue to follow up with [facility] to check delivery status.

## 2019-01-02 ENCOUNTER — Other Ambulatory Visit: Payer: Self-pay

## 2019-01-02 ENCOUNTER — Inpatient Hospital Stay: Payer: Self-pay

## 2019-01-03 NOTE — Telephone Encounter (Signed)
Per Candice at [facility], Revlimid was delivered to patient on 01/02/2019.

## 2019-01-09 ENCOUNTER — Inpatient Hospital Stay: Payer: 59 | Attending: Hematology

## 2019-01-09 ENCOUNTER — Encounter: Payer: Self-pay | Admitting: *Deleted

## 2019-01-09 ENCOUNTER — Inpatient Hospital Stay: Payer: 59

## 2019-01-09 ENCOUNTER — Other Ambulatory Visit: Payer: Self-pay

## 2019-01-09 VITALS — BP 113/61 | HR 78 | Temp 98.0°F | Resp 18

## 2019-01-09 DIAGNOSIS — E871 Hypo-osmolality and hyponatremia: Secondary | ICD-10-CM | POA: Insufficient documentation

## 2019-01-09 DIAGNOSIS — D649 Anemia, unspecified: Secondary | ICD-10-CM | POA: Insufficient documentation

## 2019-01-09 DIAGNOSIS — D72819 Decreased white blood cell count, unspecified: Secondary | ICD-10-CM | POA: Diagnosis not present

## 2019-01-09 DIAGNOSIS — Z5111 Encounter for antineoplastic chemotherapy: Secondary | ICD-10-CM | POA: Diagnosis present

## 2019-01-09 DIAGNOSIS — Z79899 Other long term (current) drug therapy: Secondary | ICD-10-CM | POA: Insufficient documentation

## 2019-01-09 DIAGNOSIS — C9000 Multiple myeloma not having achieved remission: Secondary | ICD-10-CM | POA: Insufficient documentation

## 2019-01-09 LAB — CMP (CANCER CENTER ONLY)
ALT: 19 U/L (ref 0–44)
AST: 21 U/L (ref 15–41)
Albumin: 2.8 g/dL — ABNORMAL LOW (ref 3.5–5.0)
Alkaline Phosphatase: 53 U/L (ref 38–126)
Anion gap: 5 (ref 5–15)
BUN: 25 mg/dL — ABNORMAL HIGH (ref 8–23)
CO2: 26 mmol/L (ref 22–32)
Calcium: 8.6 mg/dL — ABNORMAL LOW (ref 8.9–10.3)
Chloride: 103 mmol/L (ref 98–111)
Creatinine: 1.16 mg/dL (ref 0.61–1.24)
GFR, Est AFR Am: >60 mL/min (ref >60)
GFR, Estimated: >60 mL/min (ref >60)
Glucose, Bld: 125 mg/dL — ABNORMAL HIGH (ref 70–99)
Potassium: 4.6 mmol/L (ref 3.5–5.1)
Sodium: 134 mmol/L — ABNORMAL LOW (ref 135–145)
Total Bilirubin: 0.4 mg/dL (ref 0.3–1.2)
Total Protein: 9.4 g/dL — ABNORMAL HIGH (ref 6.5–8.1)

## 2019-01-09 LAB — CBC WITH DIFFERENTIAL (CANCER CENTER ONLY)
Abs Immature Granulocytes: 0.01 10*3/uL (ref 0.00–0.07)
Basophils Absolute: 0 10*3/uL (ref 0.0–0.1)
Basophils Relative: 0 %
Eosinophils Absolute: 0 10*3/uL (ref 0.0–0.5)
Eosinophils Relative: 0 %
HCT: 28.6 % — ABNORMAL LOW (ref 39.0–52.0)
Hemoglobin: 9.7 g/dL — ABNORMAL LOW (ref 13.0–17.0)
Immature Granulocytes: 0 %
Lymphocytes Relative: 17 %
Lymphs Abs: 0.5 10*3/uL — ABNORMAL LOW (ref 0.7–4.0)
MCH: 35.3 pg — ABNORMAL HIGH (ref 26.0–34.0)
MCHC: 33.9 g/dL (ref 30.0–36.0)
MCV: 104 fL — ABNORMAL HIGH (ref 80.0–100.0)
Monocytes Absolute: 0.1 10*3/uL (ref 0.1–1.0)
Monocytes Relative: 2 %
Neutro Abs: 2.1 10*3/uL (ref 1.7–7.7)
Neutrophils Relative %: 81 %
Platelet Count: 172 10*3/uL (ref 150–400)
RBC: 2.75 MIL/uL — ABNORMAL LOW (ref 4.22–5.81)
RDW: 13.7 % (ref 11.5–15.5)
WBC Count: 2.6 10*3/uL — ABNORMAL LOW (ref 4.0–10.5)
nRBC: 0 % (ref 0.0–0.2)

## 2019-01-09 MED ORDER — BORTEZOMIB CHEMO SQ INJECTION 3.5 MG (2.5MG/ML)
1.3000 mg/m2 | Freq: Once | INTRAMUSCULAR | Status: AC
  Administered 2019-01-09: 2.5 mg via SUBCUTANEOUS
  Filled 2019-01-09: qty 1

## 2019-01-09 MED ORDER — PROCHLORPERAZINE MALEATE 10 MG PO TABS
ORAL_TABLET | ORAL | Status: AC
  Filled 2019-01-09: qty 1

## 2019-01-09 MED ORDER — PROCHLORPERAZINE MALEATE 10 MG PO TABS
10.0000 mg | ORAL_TABLET | Freq: Once | ORAL | Status: AC
  Administered 2019-01-09: 10 mg via ORAL

## 2019-01-09 NOTE — Patient Instructions (Signed)
[place] Discharge Instructions for Patients Receiving Chemotherapy  Today you received the following chemotherapy agents Velcade  To help prevent nausea and vomiting after your treatment, we encourage you to take your nausea medication  Prochlorperazine ( Compazine) take 1 tablet every 6 hours AS NEEDED for Nausea Zofran Can take 2 times day as needed for Nausea. Ativan can take every 6 hour as needed for nausea   If you develop nausea and vomiting that is not controlled by your nausea medication, call the clinic.   BELOW ARE SYMPTOMS THAT SHOULD BE REPORTED IMMEDIATELY:  *FEVER GREATER THAN 100.5 F  *CHILLS WITH OR WITHOUT FEVER  NAUSEA AND VOMITING THAT IS NOT CONTROLLED WITH YOUR NAUSEA MEDICATION  *UNUSUAL SHORTNESS OF BREATH  *UNUSUAL BRUISING OR BLEEDING  TENDERNESS IN MOUTH AND THROAT WITH OR WITHOUT PRESENCE OF ULCERS  *URINARY PROBLEMS  *BOWEL PROBLEMS  UNUSUAL RASH Items with * indicate a potential emergency and should be followed up as soon as possible.  Feel free to call the clinic should you have any questions or concerns. The clinic phone number is [phone].  Please show the [place] at check-in to the Emergency Department and triage nurse.

## 2019-01-09 NOTE — Progress Notes (Signed)
Ok to proceed with treatment per Dr. Maylon Peppers

## 2019-01-13 ENCOUNTER — Other Ambulatory Visit: Payer: Self-pay | Admitting: Hematology

## 2019-01-13 DIAGNOSIS — C9000 Multiple myeloma not having achieved remission: Secondary | ICD-10-CM

## 2019-01-16 ENCOUNTER — Telehealth: Payer: Self-pay | Admitting: Hematology & Oncology

## 2019-01-16 ENCOUNTER — Encounter: Payer: Self-pay | Admitting: Hematology & Oncology

## 2019-01-16 ENCOUNTER — Encounter: Payer: Self-pay | Admitting: Hematology

## 2019-01-16 ENCOUNTER — Inpatient Hospital Stay: Payer: 59

## 2019-01-16 ENCOUNTER — Other Ambulatory Visit: Payer: Self-pay

## 2019-01-16 ENCOUNTER — Inpatient Hospital Stay (HOSPITAL_BASED_OUTPATIENT_CLINIC_OR_DEPARTMENT_OTHER): Payer: 59 | Admitting: Hematology

## 2019-01-16 ENCOUNTER — Other Ambulatory Visit: Payer: Self-pay | Admitting: *Deleted

## 2019-01-16 VITALS — BP 112/55 | HR 72 | Temp 97.8°F | Resp 20 | Wt 168.0 lb

## 2019-01-16 DIAGNOSIS — C9000 Multiple myeloma not having achieved remission: Secondary | ICD-10-CM

## 2019-01-16 DIAGNOSIS — D649 Anemia, unspecified: Secondary | ICD-10-CM | POA: Diagnosis not present

## 2019-01-16 DIAGNOSIS — E871 Hypo-osmolality and hyponatremia: Secondary | ICD-10-CM | POA: Diagnosis not present

## 2019-01-16 DIAGNOSIS — D72819 Decreased white blood cell count, unspecified: Secondary | ICD-10-CM

## 2019-01-16 DIAGNOSIS — Z5111 Encounter for antineoplastic chemotherapy: Secondary | ICD-10-CM | POA: Diagnosis not present

## 2019-01-16 DIAGNOSIS — Z79899 Other long term (current) drug therapy: Secondary | ICD-10-CM

## 2019-01-16 DIAGNOSIS — D539 Nutritional anemia, unspecified: Secondary | ICD-10-CM

## 2019-01-16 LAB — CBC WITH DIFFERENTIAL (CANCER CENTER ONLY)
Abs Immature Granulocytes: 0.01 10*3/uL (ref 0.00–0.07)
Basophils Absolute: 0 10*3/uL (ref 0.0–0.1)
Basophils Relative: 0 %
Eosinophils Absolute: 0 10*3/uL (ref 0.0–0.5)
Eosinophils Relative: 0 %
HEMATOCRIT: 29.3 % — AB (ref 39.0–52.0)
Hemoglobin: 10.4 g/dL — ABNORMAL LOW (ref 13.0–17.0)
Immature Granulocytes: 0 %
LYMPHS ABS: 0.4 10*3/uL — AB (ref 0.7–4.0)
Lymphocytes Relative: 15 %
MCH: 36.2 pg — ABNORMAL HIGH (ref 26.0–34.0)
MCHC: 35.5 g/dL (ref 30.0–36.0)
MCV: 102.1 fL — ABNORMAL HIGH (ref 80.0–100.0)
MONOS PCT: 2 %
Monocytes Absolute: 0.1 10*3/uL (ref 0.1–1.0)
Neutro Abs: 2 10*3/uL (ref 1.7–7.7)
Neutrophils Relative %: 83 %
Platelet Count: 194 10*3/uL (ref 150–400)
RBC: 2.87 MIL/uL — ABNORMAL LOW (ref 4.22–5.81)
RDW: 13.4 % (ref 11.5–15.5)
WBC Count: 2.5 10*3/uL — ABNORMAL LOW (ref 4.0–10.5)
nRBC: 0 % (ref 0.0–0.2)

## 2019-01-16 LAB — CMP (CANCER CENTER ONLY)
ALT: 29 U/L (ref 0–44)
AST: 30 U/L (ref 15–41)
Albumin: 3.2 g/dL — ABNORMAL LOW (ref 3.5–5.0)
Alkaline Phosphatase: 56 U/L (ref 38–126)
Anion gap: 4 — ABNORMAL LOW (ref 5–15)
BUN: 31 mg/dL — ABNORMAL HIGH (ref 8–23)
CHLORIDE: 100 mmol/L (ref 98–111)
CO2: 28 mmol/L (ref 22–32)
Calcium: 9.5 mg/dL (ref 8.9–10.3)
Creatinine: 1.11 mg/dL (ref 0.61–1.24)
GFR, Est AFR Am: >60 mL/min (ref >60)
GFR, Estimated: >60 mL/min (ref >60)
Glucose, Bld: 102 mg/dL — ABNORMAL HIGH (ref 70–99)
POTASSIUM: 4.5 mmol/L (ref 3.5–5.1)
Sodium: 132 mmol/L — ABNORMAL LOW (ref 135–145)
Total Bilirubin: 0.8 mg/dL (ref 0.3–1.2)
Total Protein: 9.8 g/dL — ABNORMAL HIGH (ref 6.5–8.1)

## 2019-01-16 MED ORDER — LENALIDOMIDE 20 MG PO CAPS: ORAL_CAPSULE | ORAL | 0 refills | Status: DC

## 2019-01-16 MED ORDER — BORTEZOMIB CHEMO SQ INJECTION 3.5 MG (2.5MG/ML)
1.3000 mg/m2 | Freq: Once | INTRAMUSCULAR | Status: AC
  Administered 2019-01-16: 2.5 mg via SUBCUTANEOUS
  Filled 2019-01-16: qty 1

## 2019-01-16 NOTE — Progress Notes (Signed)
[facility] OFFICE PROGRESS NOTE  Patient Care Team: Patient, No Pcp Per as PCP - General (General Practice) Cordelia Poche, RN as Oncology Nurse Navigator [name], MD as Medical Oncologist (Hematology)  HEME/ONC OVERVIEW: 1. 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 and DS  -10/2018: MRI of the right shoulder for arm pain showed innumerable enhancing lesions throughout the right humerus, ribs and right scapula; no fractures -11/2018: baseline labs  Hgb 10, Ca 12.2, Cr 2.89, albumin 2.9  M-spike 3.7 g/dL, free lambda 7.8, monoclonal IgG lambda on IFE, quant IgG 5602, LDH 180, B2M 6.8  PET showed innumerable lytic lesions involving the axial and proximal appendicular skeleton and calvarium; no plasmacytoma   BM bx: normocellular marrow with plasma cell neoplasm (~64% of all cells); myeloma FISH showed trisomy 11 and gain of ATM gene (standard risk)  2. Myeloma-associated bone disease -S/p Xgeva x 1 for Ca 12.2; Zometa (insurance-preferred) on hold, pending dental evaluation  TREATMENT REGIMEN:  01/09/2019 - present: q21day RVd; Revlimid dose reduced to 26m daily due to fluctuating renal function  -C1 (2/20), C2 (3/12)  ASSESSMENT & PLAN:   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 and DS  -Patient tolerated Cycle 1 of RVd relatively well; Cr normalized since normalization of Ca and initiation of treatment  -[facility], [facility] early [facility] -He is tolerating treatment relatively well  -Labs adequate, proceed with Velcade injection today and continue Revlimid as prescribed  -C2 tentatively to start on 01/30/2019 -VZV ppx: acyclovir -PRN anti-emetics: Zofran, Compazine, Ativan  -Of note, patient is a veteran and is being evaluated for service-connected condition due to multiple myeloma   Macrocytic anemia -Secondary to MM and chemotherapy  -Hgb 10.4, improving -Patient denies any  symptoms of bleeding -We will monitor for now; no indication for dose adjustment  Leukopenia -Secondary to MM and chemotherapy  -WBC 2.5k with [place]-Patient denies any symptoms of bleeding -We will monitor for now; no indication for dose adjustment  Hyponatremia -Likely secondary to paraproteinemia -Na 132 today, slightly worse than last week -Patient denies any symptoms of hyponatremia -We will monitor it for now  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 -PET showed innumerable lytic lesions involving the skeleton -S/p Xgeva x 1 due to hypercalcemia (Ca 12.2) -Subsequent bone-protecting agent on hold (Zometa preferred by his insurance), pending dental evaluation with Dr. BAlinda Money-Patient had been reluctant to have dental evaluation because of the anticipated the costs; I explained to the patient that bone protecting agents, such as Zometa, can increase risk of osteonecrosis of the jaw and complicate any future dental procedure without proper dental evaluation and treatment not indicated prior to starting treatment; as he is being evaluated for bone marrow transplant, he would likely require dental clearance prior to bone marrow transplant if he is a candidate -Therefore, it is important that the patient meets with his dentist to discuss any interventions prior to starting Zometa -On Ca-Vit D supplement BID   No orders of the defined types were placed in this encounter.  All questions were answered. The patient knows to call the clinic with any problems, questions or concerns. No barriers to learning was detected.   Return on 01/30/2019 at the start of C2 of RVd for labs, Velcade injection and clinic follow-up.  YTish Men MD 01/16/2019 1:19 PM  CHIEF COMPLAINT: "I am doing well so far"  INTERVAL HISTORY: Mr. HFier  returns to clinic for follow-up multiple myeloma on RVD.  He started Revlimid and Velcade 1 week ago, and has tolerated treatment well so far.  He denies any rash, nausea,  vomiting, diarrhea, or other side effects.  He denies any neuropathy from Velcade injection so far.  He has not met with a dentist yet, due to the concern about the cost of the dental procedures.  I spent some time explaining to the patient the importance of a dental procedure prior to starting bone protecting agent, such as Zometa.  Patient expressed understanding, and will meet his dentist soon.  He is also being evaluated for service-connected condition related to multiple myeloma at the [place].  SUMMARY OF ONCOLOGIC HISTORY:   Multiple myeloma ([place])   11/11/2018 Imaging    MRI right shoulder for arm pain showed innumerable enhancing lesions throughout the right humerus, ribs and right scapula; no fractures    11/29/2018 Miscellaneous    Baseline labs -Hgb 10, Ca 12.2, Cr 2.89, albumin 2.9 -M-spike 3.7 g/dL, free lambda 7.8, monoclonal IgG lambda on IFE, quant IgG 5602, LDH 180, B2M 6.8    01/09/2019 -  Chemotherapy    The patient had bortezomib SQ (VELCADE) chemo injection 2.5 mg, 1.3 mg/m2 = 2.5 mg, Subcutaneous,  Once, 1 of 4 cycles Administration: 2.5 mg (01/09/2019)  for chemotherapy treatment.      REVIEW OF SYSTEMS:   Constitutional: ( - ) fevers, ( - )  chills , ( - ) night sweats Eyes: ( - ) blurriness of vision, ( - ) double vision, ( - ) watery eyes Ears, nose, mouth, throat, and face: ( - ) mucositis, ( - ) sore throat Respiratory: ( - ) cough, ( - ) dyspnea, ( - ) wheezes Cardiovascular: ( - ) palpitation, ( - ) chest discomfort, ( - ) lower extremity swelling Gastrointestinal:  ( - ) nausea, ( - ) heartburn, ( - ) change in bowel habits Skin: ( - ) abnormal skin rashes Lymphatics: ( - ) new lymphadenopathy, ( - ) easy bruising Neurological: ( - ) numbness, ( - ) tingling, ( - ) new weaknesses Behavioral/Psych: ( - ) mood change, ( - ) new changes  All other systems were reviewed with the patient and are negative.  I have reviewed the past medical history, past surgical  history, social history and family history with the patient and they are unchanged from previous note.  ALLERGIES:  has No Known Allergies.  MEDICATIONS:  Current Outpatient Medications  Medication Sig Dispense Refill  . acyclovir (ZOVIRAX) 400 MG tablet Take 1 tablet (400 mg total) by mouth 2 (two) times daily. 60 tablet 3  . dexamethasone (DECADRON) 4 MG tablet Take 10 tablets (40 mg) on days 1, 8, and 15 of chemo. Repeat every 21 days. 30 tablet 3  . ferrous sulfate 325 (65 FE) MG EC tablet Take 325 mg by mouth 3 (three) times daily with meals.    . Multiple Vitamin (MULTIVITAMIN WITH MINERALS) TABS Take 1 tablet by mouth daily.    Marland Kitchen lenalidomide (REVLIMID) 20 MG capsule Take one capsule daily on days 1-14. Repeat every 21 days. 14 capsule 0  . lidocaine-prilocaine (EMLA) cream Apply to affected area once (Patient not taking: Reported on 01/16/2019) 30 g 3  . LORazepam (ATIVAN) 0.5 MG tablet Take 1 tablet (0.5 mg total) by mouth every 6 (six) hours as needed (Nausea or vomiting). (Patient not taking: Reported on 01/16/2019) 30 tablet 0  . ondansetron (ZOFRAN)  8 MG tablet Take 1 tablet (8 mg total) by mouth 2 (two) times daily as needed (Nausea or vomiting). (Patient not taking: Reported on 01/16/2019) 30 tablet 1  . prochlorperazine (COMPAZINE) 10 MG tablet Take 1 tablet (10 mg total) by mouth every 6 (six) hours as needed (Nausea or vomiting). (Patient not taking: Reported on 01/16/2019) 30 tablet 1   No current facility-administered medications for this visit.     PHYSICAL EXAMINATION: ECOG PERFORMANCE STATUS: 0 - Asymptomatic  Today's Vitals   01/16/19 1000 01/16/19 1003  BP:  (!) 112/55  Pulse:  72  Resp:  20  Temp:  97.8 F (36.6 C)  TempSrc:  Oral  SpO2:  99%  Weight:  168 lb (76.2 kg)  PainSc: 0-No pain    Body mass index is 24.81 kg/m.  Filed Weights   01/16/19 1003  Weight: 168 lb (76.2 kg)    GENERAL: alert, no distress and comfortable SKIN: skin color, texture,  turgor are normal, no rashes or significant lesions EYES: conjunctiva are pink and non-injected, sclera clear OROPHARYNX: no exudate, no erythema; lips, buccal mucosa, and tongue normal  NECK: supple, non-tender LYMPH:  no palpable lymphadenopathy in the cervical LUNGS: clear to auscultation with normal breathing effort HEART: regular rate & rhythm and no murmurs and no lower extremity edema ABDOMEN: soft, non-tender, non-distended, normal bowel sounds Musculoskeletal: no cyanosis of digits and no clubbing  PSYCH: alert & oriented x 3, fluent speech NEURO: no focal motor/sensory deficits  LABORATORY DATA:  I have reviewed the data as listed    Component Value Date/Time   NA 132 (L) 01/16/2019 0921   K 4.5 01/16/2019 0921   CL 100 01/16/2019 0921   CO2 28 01/16/2019 0921   GLUCOSE 102 (H) 01/16/2019 0921   BUN 31 (H) 01/16/2019 0921   CREATININE 1.11 01/16/2019 0921   CALCIUM 9.5 01/16/2019 0921   PROT 9.8 (H) 01/16/2019 0921   ALBUMIN 3.2 (L) 01/16/2019 0921   AST 30 01/16/2019 0921   ALT 29 01/16/2019 0921   ALKPHOS 56 01/16/2019 0921   BILITOT 0.8 01/16/2019 0921   GFRNONAA >60 01/16/2019 0921   GFRAA >60 01/16/2019 0921    No results found for: SPEP, UPEP  Lab Results  Component Value Date   WBC 2.5 (L) 01/16/2019   NEUTROABS 2.0 01/16/2019   HGB 10.4 (L) 01/16/2019   HCT 29.3 (L) 01/16/2019   MCV 102.1 (H) 01/16/2019   PLT 194 01/16/2019      Chemistry      Component Value Date/Time   NA 132 (L) 01/16/2019 0921   K 4.5 01/16/2019 0921   CL 100 01/16/2019 0921   CO2 28 01/16/2019 0921   BUN 31 (H) 01/16/2019 0921   CREATININE 1.11 01/16/2019 0921      Component Value Date/Time   CALCIUM 9.5 01/16/2019 0921   ALKPHOS 56 01/16/2019 0921   AST 30 01/16/2019 0921   ALT 29 01/16/2019 0921   BILITOT 0.8 01/16/2019 4403

## 2019-01-16 NOTE — Telephone Encounter (Signed)
Appointments scheduled/ Changed 3/12 & 4/2 due to adding follow up / letter/calendar mailed per 2/27 los

## 2019-01-23 ENCOUNTER — Other Ambulatory Visit: Payer: Self-pay

## 2019-01-23 ENCOUNTER — Inpatient Hospital Stay: Payer: 59 | Attending: Hematology

## 2019-01-23 ENCOUNTER — Inpatient Hospital Stay: Payer: 59

## 2019-01-23 VITALS — BP 106/59 | HR 65 | Temp 97.7°F | Resp 18

## 2019-01-23 DIAGNOSIS — Z5112 Encounter for antineoplastic immunotherapy: Secondary | ICD-10-CM | POA: Insufficient documentation

## 2019-01-23 DIAGNOSIS — C9000 Multiple myeloma not having achieved remission: Secondary | ICD-10-CM | POA: Insufficient documentation

## 2019-01-23 LAB — CMP (CANCER CENTER ONLY)
ALBUMIN: 3.3 g/dL — AB (ref 3.5–5.0)
ALT: 29 U/L (ref 0–44)
AST: 22 U/L (ref 15–41)
Alkaline Phosphatase: 64 U/L (ref 38–126)
Anion gap: 4 — ABNORMAL LOW (ref 5–15)
BUN: 25 mg/dL — ABNORMAL HIGH (ref 8–23)
CHLORIDE: 103 mmol/L (ref 98–111)
CO2: 28 mmol/L (ref 22–32)
Calcium: 8.7 mg/dL — ABNORMAL LOW (ref 8.9–10.3)
Creatinine: 1.13 mg/dL (ref 0.61–1.24)
GFR, Est AFR Am: >60 mL/min (ref >60)
GFR, Estimated: >60 mL/min (ref >60)
Glucose, Bld: 125 mg/dL — ABNORMAL HIGH (ref 70–99)
Potassium: 5.1 mmol/L (ref 3.5–5.1)
SODIUM: 135 mmol/L (ref 135–145)
Total Bilirubin: 0.5 mg/dL (ref 0.3–1.2)
Total Protein: 7.9 g/dL (ref 6.5–8.1)

## 2019-01-23 LAB — CBC WITH DIFFERENTIAL (CANCER CENTER ONLY)
Abs Immature Granulocytes: 0.01 10*3/uL (ref 0.00–0.07)
Basophils Absolute: 0 10*3/uL (ref 0.0–0.1)
Basophils Relative: 1 %
Eosinophils Absolute: 0 10*3/uL (ref 0.0–0.5)
Eosinophils Relative: 1 %
HCT: 29.2 % — ABNORMAL LOW (ref 39.0–52.0)
HEMOGLOBIN: 10.2 g/dL — AB (ref 13.0–17.0)
Immature Granulocytes: 1 %
LYMPHS ABS: 0.3 10*3/uL — AB (ref 0.7–4.0)
Lymphocytes Relative: 17 %
MCH: 36 pg — ABNORMAL HIGH (ref 26.0–34.0)
MCHC: 34.9 g/dL (ref 30.0–36.0)
MCV: 103.2 fL — ABNORMAL HIGH (ref 80.0–100.0)
Monocytes Absolute: 0.1 10*3/uL (ref 0.1–1.0)
Monocytes Relative: 7 %
Neutro Abs: 1.2 10*3/uL — ABNORMAL LOW (ref 1.7–7.7)
Neutrophils Relative %: 73 %
Platelet Count: 150 10*3/uL (ref 150–400)
RBC: 2.83 MIL/uL — ABNORMAL LOW (ref 4.22–5.81)
RDW: 13.9 % (ref 11.5–15.5)
WBC Count: 1.7 10*3/uL — ABNORMAL LOW (ref 4.0–10.5)
nRBC: 0 % (ref 0.0–0.2)

## 2019-01-23 MED ORDER — PROCHLORPERAZINE MALEATE 10 MG PO TABS: 10.0000 mg | ORAL_TABLET | Freq: Once | ORAL | Status: DC

## 2019-01-23 MED ORDER — BORTEZOMIB CHEMO SQ INJECTION 3.5 MG (2.5MG/ML)
1.3000 mg/m2 | Freq: Once | INTRAMUSCULAR | Status: AC
  Administered 2019-01-23: 2.5 mg via SUBCUTANEOUS
  Filled 2019-01-23: qty 1

## 2019-01-23 NOTE — Progress Notes (Signed)
OK to treat with WBC-1.7 and ANC-1.2 per Dr. Maylon Peppers.

## 2019-01-30 ENCOUNTER — Inpatient Hospital Stay: Payer: 59

## 2019-01-30 ENCOUNTER — Telehealth: Payer: Self-pay | Admitting: Hematology

## 2019-01-30 ENCOUNTER — Other Ambulatory Visit: Payer: Self-pay

## 2019-01-30 ENCOUNTER — Inpatient Hospital Stay (HOSPITAL_BASED_OUTPATIENT_CLINIC_OR_DEPARTMENT_OTHER): Payer: 59 | Admitting: Hematology

## 2019-01-30 ENCOUNTER — Inpatient Hospital Stay: Payer: Self-pay

## 2019-01-30 ENCOUNTER — Encounter: Payer: Self-pay | Admitting: Hematology

## 2019-01-30 VITALS — BP 94/52 | HR 55 | Temp 98.3°F | Resp 16 | Ht 69.0 in | Wt 170.1 lb

## 2019-01-30 DIAGNOSIS — D701 Agranulocytosis secondary to cancer chemotherapy: Secondary | ICD-10-CM

## 2019-01-30 DIAGNOSIS — C9000 Multiple myeloma not having achieved remission: Secondary | ICD-10-CM

## 2019-01-30 DIAGNOSIS — D6481 Anemia due to antineoplastic chemotherapy: Secondary | ICD-10-CM | POA: Diagnosis not present

## 2019-01-30 DIAGNOSIS — D6959 Other secondary thrombocytopenia: Secondary | ICD-10-CM | POA: Insufficient documentation

## 2019-01-30 DIAGNOSIS — Z5112 Encounter for antineoplastic immunotherapy: Secondary | ICD-10-CM | POA: Diagnosis not present

## 2019-01-30 DIAGNOSIS — Z79899 Other long term (current) drug therapy: Secondary | ICD-10-CM

## 2019-01-30 DIAGNOSIS — T50905A Adverse effect of unspecified drugs, medicaments and biological substances, initial encounter: Secondary | ICD-10-CM | POA: Insufficient documentation

## 2019-01-30 DIAGNOSIS — T451X5A Adverse effect of antineoplastic and immunosuppressive drugs, initial encounter: Secondary | ICD-10-CM

## 2019-01-30 LAB — CBC WITH DIFFERENTIAL (CANCER CENTER ONLY)
Abs Immature Granulocytes: 0.01 10*3/uL (ref 0.00–0.07)
Basophils Absolute: 0 10*3/uL (ref 0.0–0.1)
Basophils Relative: 1 %
Eosinophils Absolute: 0 10*3/uL (ref 0.0–0.5)
Eosinophils Relative: 0 %
HCT: 30.2 % — ABNORMAL LOW (ref 39.0–52.0)
Hemoglobin: 10.3 g/dL — ABNORMAL LOW (ref 13.0–17.0)
Immature Granulocytes: 1 %
Lymphocytes Relative: 20 %
Lymphs Abs: 0.3 10*3/uL — ABNORMAL LOW (ref 0.7–4.0)
MCH: 35 pg — AB (ref 26.0–34.0)
MCHC: 34.1 g/dL (ref 30.0–36.0)
MCV: 102.7 fL — ABNORMAL HIGH (ref 80.0–100.0)
Monocytes Absolute: 0.1 10*3/uL (ref 0.1–1.0)
Monocytes Relative: 6 %
Neutro Abs: 1 10*3/uL — ABNORMAL LOW (ref 1.7–7.7)
Neutrophils Relative %: 72 %
Platelet Count: 129 10*3/uL — ABNORMAL LOW (ref 150–400)
RBC: 2.94 MIL/uL — ABNORMAL LOW (ref 4.22–5.81)
RDW: 13.8 % (ref 11.5–15.5)
WBC Count: 1.3 10*3/uL — ABNORMAL LOW (ref 4.0–10.5)
nRBC: 0 % (ref 0.0–0.2)

## 2019-01-30 LAB — CMP (CANCER CENTER ONLY)
ALT: 26 U/L (ref 0–44)
AST: 23 U/L (ref 15–41)
Albumin: 3.5 g/dL (ref 3.5–5.0)
Alkaline Phosphatase: 64 U/L (ref 38–126)
Anion gap: 6 (ref 5–15)
BUN: 28 mg/dL — ABNORMAL HIGH (ref 8–23)
CO2: 25 mmol/L (ref 22–32)
Calcium: 8.6 mg/dL — ABNORMAL LOW (ref 8.9–10.3)
Chloride: 104 mmol/L (ref 98–111)
Creatinine: 0.9 mg/dL (ref 0.61–1.24)
GFR, Est AFR Am: >60 mL/min (ref >60)
GFR, Estimated: >60 mL/min (ref >60)
Glucose, Bld: 134 mg/dL — ABNORMAL HIGH (ref 70–99)
Potassium: 4.5 mmol/L (ref 3.5–5.1)
Sodium: 135 mmol/L (ref 135–145)
Total Bilirubin: 0.5 mg/dL (ref 0.3–1.2)
Total Protein: 7.3 g/dL (ref 6.5–8.1)

## 2019-01-30 MED ORDER — ACYCLOVIR 400 MG PO TABS: 400.0000 mg | ORAL_TABLET | Freq: Two times a day (BID) | ORAL | 3 refills | Status: DC

## 2019-01-30 MED ORDER — PROCHLORPERAZINE MALEATE 10 MG PO TABS: 10.0000 mg | ORAL_TABLET | Freq: Once | ORAL | Status: DC

## 2019-01-30 MED ORDER — BORTEZOMIB CHEMO SQ INJECTION 3.5 MG (2.5MG/ML)
1.3000 mg/m2 | Freq: Once | INTRAMUSCULAR | Status: AC
  Administered 2019-01-30: 2.5 mg via SUBCUTANEOUS
  Filled 2019-01-30: qty 1

## 2019-01-30 NOTE — Telephone Encounter (Signed)
-  Return to clinic on 02/06/2019  -Other appts no change per 3/12 los

## 2019-01-30 NOTE — Progress Notes (Signed)
Ok to tx with ANC=1000 per Dr Maylon Peppers

## 2019-01-30 NOTE — Progress Notes (Signed)
[facility] OFFICE PROGRESS NOTE  Patient Care Team: Patient, No Pcp Per as PCP - General (General Practice) Cordelia Poche, RN as Oncology Nurse Navigator [name], MD as Medical Oncologist (Hematology)  HEME/ONC OVERVIEW: 1. 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 and DS  -10/2018: MRI of the right shoulder for arm pain showed innumerable enhancing lesions throughout the right humerus, ribs and right scapula; no fractures -11/2018: baseline labs  Hgb 10, Ca 12.2, Cr 2.89, albumin 2.9  M-spike 3.7 g/dL, free lambda 7.8, monoclonal IgG lambda on IFE, quant IgG 5602, LDH 180, B2M 6.8  PET showed innumerable lytic lesions involving the axial and proximal appendicular skeleton and calvarium; no plasmacytoma   BM bx: normocellular marrow with plasma cell neoplasm (~64% of all cells); myeloma FISH showed trisomy 11 and gain of ATM gene (standard risk)  2. Myeloma-associated bone disease -S/p Xgeva x 1 for Ca 12.2; Zometa (insurance-preferred) on hold, pending dental evaluation  TREATMENT REGIMEN:  01/09/2019 - present: q21day RVd; Revlimid dose reduced to 22m daily due to fluctuating renal function  -C1 (2/20), C2 (3/12)  ASSESSMENT & PLAN:   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 and DS  -S/p 1 cycle of RVd today; patient tolerated treatment relatively well -Labs adequate today, start Cycle 2 of RVd today -[facility] felt to be a good [facility] for [facility]; [place] and [place] sure yet whether [facility] [place] -VZV ppx: Acyclovir -PRN antiemetics: Zofran, Compazine, Ativan  Chemotherapy-associated leukopenia with borderline neutropenia -Secondary to chemotherapy -WBC 1.3k with ANC 1000 -Patient denies any symptoms of infection -As  patient just started RVd, he may develop some mild neutropenia early during treatment and this usually improves with treatment of underlying myeloma -Patient was counseled on the importance of maintaining adequate hand hygiene and avoiding people who are ill; in addition, he is instructed to call the clinic if he develops any symptoms of infection, including fever > 100.4  -If neutropenia persists, we may consider dose reduce Revlimid to 165m  Chemotherapy-associated anemia -Secondary to MM and chemotherapy -Hgb 10.3 today stable -Patient denies any symptom bleeding -We will monitor for now; no indication for dose adjustment  Chemotherapy-associated thrombocytopenia -Secondary to chemotherapy -Plts 129k, slightly lower than last week -Patient denies any symptoms of bleeding or excess bruising -We will monitor for now; no indication for dose adjustment  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 -Multiple lytic bone lesions involving the entire skeleton demonstrated on PET -S/p Xgeva x 1 due to hypercalcemia -Patient was evaluated by Dr. KuEnrique Sackf dental medicine, who recommended holding off bone protecting agent until patient completes his dental work; [facility] -Once his dental work is complete, we will plan to resume bisphosphonate ASAP  -On Ca-Vit D supplement BID  -Patient declined pain medication at this time, but understand that he can contact the clinic if he develops worsening pain  No orders of the defined types were placed in this encounter.  All questions were answered. The patient knows to call the clinic with any problems, questions or concerns. No barriers to learning was detected.  Return in 1 week for labs and clinic follow-up on C2D8 of RVd.   [name], MD 01/30/2019 4:23 PM  CHIEF COMPLAINT: "I am just a little  sore"  INTERVAL HISTORY: Mr. Schoch returns to clinic for follow-up multiple myeloma prior to starting cycle 2 of RVD.  Patient reports that he recently met with bone marrow transplant at [facility], and has been referred to dental medicine there for pretransplant dental evaluation.  He reports tolerating the first cycle of RVD very well except mild fatigue.  He has chronic, generalized achiness in muscles that began before starting chemotherapy, and overall has been stable since starting treatment. He rates it 5-6/10, constant, exacerbated with activity, and not improved with aspirin.  He does not feel like the pain is severe enough to require opioid pain medication.  He denies any other complaint today.  SUMMARY OF ONCOLOGIC HISTORY:   Multiple myeloma ([place])   11/11/2018 Imaging    MRI right shoulder for arm pain showed innumerable enhancing lesions throughout the right humerus, ribs and right scapula; no fractures    11/29/2018 Miscellaneous    Baseline labs -Hgb 10, Ca 12.2, Cr 2.89, albumin 2.9 -M-spike 3.7 g/dL, free lambda 7.8, monoclonal IgG lambda on IFE, quant IgG 5602, LDH 180, B2M 6.8    01/09/2019 -  Chemotherapy    The patient had bortezomib SQ (VELCADE) chemo injection 2.5 mg, 1.3 mg/m2 = 2.5 mg, Subcutaneous,  Once, 2 of 4 cycles Administration: 2.5 mg (01/09/2019), 2.5 mg (01/16/2019), 2.5 mg (01/23/2019), 2.5 mg (01/30/2019)  for chemotherapy treatment.      REVIEW OF SYSTEMS:   Constitutional: ( - ) fevers, ( - )  chills , ( - ) night sweats Eyes: ( - ) blurriness of vision, ( - ) double vision, ( - ) watery eyes Ears, nose, mouth, throat, and face: ( - ) mucositis, ( - ) sore throat Respiratory: ( - ) cough, ( - ) dyspnea, ( - ) wheezes Cardiovascular: ( - ) palpitation, ( - ) chest discomfort, ( - ) lower extremity swelling Gastrointestinal:  ( - ) nausea, ( - ) heartburn, ( - ) change in bowel habits Skin: ( - ) abnormal skin rashes Lymphatics: ( - ) new  lymphadenopathy, ( - ) easy bruising Neurological: ( - ) numbness, ( - ) tingling, ( - ) new weaknesses Behavioral/Psych: ( - ) mood change, ( - ) new changes  All other systems were reviewed with the patient and are negative.  I have reviewed the past medical history, past surgical history, social history and family history with the patient and they are unchanged from previous note.  ALLERGIES:  has No Known Allergies.  MEDICATIONS:  Current Outpatient Medications  Medication Sig Dispense Refill  . acyclovir (ZOVIRAX) 400 MG tablet Take 1 tablet (400 mg total) by mouth 2 (two) times daily. 60 tablet 3  . dexamethasone (DECADRON) 4 MG tablet Take 10 tablets (40 mg) on days 1, 8, and 15 of chemo. Repeat every 21 days. 30 tablet 3  . ferrous sulfate 325 (65 FE) MG EC tablet Take 325 mg by mouth 3 (three) times daily with meals.    Marland Kitchen lenalidomide (REVLIMID) 20 MG capsule Take one capsule daily on days 1-14. Repeat every 21 days. 14 capsule 0  . lidocaine-prilocaine (EMLA) cream Apply to affected area once 30 g 3  . LORazepam (ATIVAN) 0.5 MG tablet Take 1 tablet (0.5 mg total) by mouth every 6 (six) hours as needed (  Nausea or vomiting). 30 tablet 0  . Multiple Vitamin (MULTIVITAMIN WITH MINERALS) TABS Take 1 tablet by mouth daily.    . ondansetron (ZOFRAN) 8 MG tablet Take 1 tablet (8 mg total) by mouth 2 (two) times daily as needed (Nausea or vomiting). 30 tablet 1  . prochlorperazine (COMPAZINE) 10 MG tablet Take 1 tablet (10 mg total) by mouth every 6 (six) hours as needed (Nausea or vomiting). 30 tablet 1   No current facility-administered medications for this visit.    Facility-Administered Medications Ordered in Other Visits  Medication Dose Route Frequency Provider Last Rate Last Dose  . prochlorperazine (COMPAZINE) tablet 10 mg  10 mg Oral Once [name], MD        PHYSICAL EXAMINATION: ECOG PERFORMANCE STATUS: 0 - Asymptomatic  Today's Vitals   01/30/19 1314  BP: (!) 94/52   Pulse: (!) 55  Resp: 16  Temp: 98.3 F (36.8 C)  TempSrc: Oral  SpO2: 99%  Weight: 170 lb 1.9 oz (77.2 kg)  Height: '5\' 9"'  (1.753 m)  PainSc: 0-No pain   Body mass index is 25.12 kg/m.  Filed Weights   01/30/19 1314  Weight: 170 lb 1.9 oz (77.2 kg)    GENERAL: alert, no distress and comfortable, well appearing  SKIN: skin color, texture, turgor are normal, no rashes or significant lesions EYES: conjunctiva are pink and non-injected, sclera clear OROPHARYNX: no exudate, no erythema; lips, buccal mucosa, and tongue normal  NECK: supple, non-tender LYMPH:  no palpable lymphadenopathy in the cervical LUNGS: clear to auscultation with normal breathing effort HEART: regular rate & rhythm and no murmurs and no lower extremity edema ABDOMEN: soft, non-tender, non-distended, normal bowel sounds Musculoskeletal: no cyanosis of digits and no clubbing  PSYCH: alert & oriented x 3, fluent speech NEURO: no focal motor/sensory deficits  LABORATORY DATA:  I have reviewed the data as listed    Component Value Date/Time   NA 135 01/30/2019 1256   K 4.5 01/30/2019 1256   CL 104 01/30/2019 1256   CO2 25 01/30/2019 1256   GLUCOSE 134 (H) 01/30/2019 1256   BUN 28 (H) 01/30/2019 1256   CREATININE 0.90 01/30/2019 1256   CALCIUM 8.6 (L) 01/30/2019 1256   PROT 7.3 01/30/2019 1256   ALBUMIN 3.5 01/30/2019 1256   AST 23 01/30/2019 1256   ALT 26 01/30/2019 1256   ALKPHOS 64 01/30/2019 1256   BILITOT 0.5 01/30/2019 1256   GFRNONAA >60 01/30/2019 1256   GFRAA >60 01/30/2019 1256    No results found for: SPEP, UPEP  Lab Results  Component Value Date   WBC 1.3 (L) 01/30/2019   NEUTROABS 1.0 (L) 01/30/2019   HGB 10.3 (L) 01/30/2019   HCT 30.2 (L) 01/30/2019   MCV 102.7 (H) 01/30/2019   PLT 129 (L) 01/30/2019      Chemistry      Component Value Date/Time   NA 135 01/30/2019 1256   K 4.5 01/30/2019 1256   CL 104 01/30/2019 1256   CO2 25 01/30/2019 1256   BUN 28 (H) 01/30/2019 1256    CREATININE 0.90 01/30/2019 1256      Component Value Date/Time   CALCIUM 8.6 (L) 01/30/2019 1256   ALKPHOS 64 01/30/2019 1256   AST 23 01/30/2019 1256   ALT 26 01/30/2019 1256   BILITOT 0.5 01/30/2019 1256

## 2019-01-30 NOTE — Patient Instructions (Signed)

## 2019-01-31 ENCOUNTER — Other Ambulatory Visit: Payer: Self-pay

## 2019-02-03 ENCOUNTER — Other Ambulatory Visit: Payer: Self-pay | Admitting: Hematology

## 2019-02-03 DIAGNOSIS — C9000 Multiple myeloma not having achieved remission: Secondary | ICD-10-CM

## 2019-02-05 ENCOUNTER — Telehealth: Payer: Self-pay | Admitting: Hematology

## 2019-02-05 NOTE — Progress Notes (Signed)
[place] OFFICE PROGRESS NOTE  Patient Care Team: Patient, No Pcp Per as PCP - General (General Practice) Cordelia Poche, RN as Oncology Nurse Navigator [name], MD as Medical Oncologist (Hematology)  HEME/ONC OVERVIEW: 1. 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 and DS  -10/2018: MRI of the right shoulder for arm pain showed innumerable enhancing lesions throughout the right humerus, ribs and right scapula; no fractures -11/2018: baseline labs  Hgb 10, Ca 12.2, Cr 2.89, albumin 2.9  M-spike 3.7 g/dL, free lambda 7.8, monoclonal IgG lambda on IFE, quant IgG 5602, LDH 180, B2M 6.8  PET showed innumerable lytic lesions involving the axial and proximal appendicular skeleton and calvarium; no plasmacytoma   BM bx: normocellular marrow with plasma cell neoplasm (~64% of all cells); myeloma FISH showed trisomy 11 and gain of ATM gene (standard risk)  2. Myeloma-associated bone disease -S/p Xgeva x 1 for Ca 12.2; Zometa (insurance-preferred) on hold, pending dental evaluation  TREATMENT REGIMEN:  01/09/2019 - present: q21day RVd; Revlimid dose reduced to 28m daily due to fluctuating renal function  -C1 (2/20), C2 (3/12)  ASSESSMENT & PLAN:   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 and DS  -S/p 1 cycle of RVD; C2 started on 01/30/2019 -Labs adequate today, continue with C2D8 of RVd -[facility] [place] -VZV ppx: acyclovir -PRN anti-emetics: Zofran, Compazine, Ativan   Chemotherapy-associated leukopenia with borderline neutropenia -Secondary to chemotherapy -WBC 1.8k with ANC 1400, stable -Patient denies any symptoms of infection -We will monitor for now; no indication for dose adjustment -If leukopenia worsens in the future, we will consider delaying  chemotherapy or adjusting chemotherapy dose (ie reduce Revlimid dose to 114m  Chemotherapy-associated anemia -Secondary to chemotherapy and MM -Hgb 10.5, stable -Patient denies any symptom of bleeding -We will monitor for now; no indication for dose adjustment -If anemia worsens in the future, we will consider delaying chemotherapy or adjusting chemotherapy dose  Chemotherapy-associated thrombocytopenia -Secondary to chemotherapy -Plts 155k, improving -Patient denies any symptoms of bleeding or excess bruising, such as epistaxis, hematochezia, melena, or hematuria -We will monitor for now; no indication for dose adjustment -If thrombocytopenia worsens in the future, we will consider delaying chemotherapy or adjusting chemotherapy dose  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 -S/p Xgeva x 1 due to hypercalcemia -Patient met with Dr. BoAlinda Moneyf dentistry and was referred to OMFS, appt on 02/06/2019 -Patient understands that he must complete his dental evaluation prior to starting bone-protecting agent due to the risk of osteonecrosis of the jaw  -On Ca-Vit D supplements BID  -He will require bisphosphonate per his insurance approval  Hypocalcemia -Secondary to recent Xgeva -Ca 8.1 today; patient is asymptomatic  -Patient is currently taking Ca-Vit D supplement BID -We will monitor it for now   No orders of the defined types were placed in this encounter.  All questions were answered. The patient knows to call the clinic with any problems, questions or concerns. No barriers to learning was detected.  Return on 02/20/2019 at start of C3D1 of RVd.   YaTish MenMD 02/06/2019 11:57 AM  CHIEF COMPLAINT: "I am doing fine"  INTERVAL HISTORY: Mr. HaTretheweyeturns to clinic for follow-up of multiple myeloma on RVD.  Patient reports that he has mild chronic left shoulder and arm pain,  persistent, exacerbated by exertion, moderate in intensity, nonradiating, but has not required any medication for  pain, such as Tylenol.  He feels the pain is overall tolerable and declined pain medication.  He saw his dentist Dr. Alinda Money this past Tuesday, and that was referred to an OMFS surgeon to be seen this afternoon.  He is otherwise tolerating chemotherapy relatively well without significant side effects.  SUMMARY OF ONCOLOGIC HISTORY:   Multiple myeloma ([place])   11/11/2018 Imaging    MRI right shoulder for arm pain showed innumerable enhancing lesions throughout the right humerus, ribs and right scapula; no fractures    11/29/2018 Miscellaneous    Baseline labs -Hgb 10, Ca 12.2, Cr 2.89, albumin 2.9 -M-spike 3.7 g/dL, free lambda 7.8, monoclonal IgG lambda on IFE, quant IgG 5602, LDH 180, B2M 6.8    01/09/2019 -  Chemotherapy    The patient had bortezomib SQ (VELCADE) chemo injection 2.5 mg, 1.3 mg/m2 = 2.5 mg, Subcutaneous,  Once, 2 of 4 cycles Administration: 2.5 mg (01/09/2019), 2.5 mg (01/16/2019), 2.5 mg (01/23/2019), 2.5 mg (01/30/2019)  for chemotherapy treatment.      REVIEW OF SYSTEMS:   Constitutional: ( - ) fevers, ( - )  chills , ( - ) night sweats Eyes: ( - ) blurriness of vision, ( - ) double vision, ( - ) watery eyes Ears, nose, mouth, throat, and face: ( - ) mucositis, ( - ) sore throat Respiratory: ( - ) cough, ( - ) dyspnea, ( - ) wheezes Cardiovascular: ( - ) palpitation, ( - ) chest discomfort, ( - ) lower extremity swelling Gastrointestinal:  ( - ) nausea, ( - ) heartburn, ( - ) change in bowel habits Skin: ( - ) abnormal skin rashes Lymphatics: ( - ) new lymphadenopathy, ( - ) easy bruising Neurological: ( - ) numbness, ( - ) tingling, ( - ) new weaknesses Behavioral/Psych: ( - ) mood change, ( - ) new changes  All other systems were reviewed with the patient and are negative.  I have reviewed the past medical history, past surgical history, social history and family history with the patient and they are unchanged from previous note.  ALLERGIES:  has No Known  Allergies.  MEDICATIONS:  Current Outpatient Medications  Medication Sig Dispense Refill  . acyclovir (ZOVIRAX) 400 MG tablet Take 1 tablet (400 mg total) by mouth 2 (two) times daily. 60 tablet 3  . dexamethasone (DECADRON) 4 MG tablet Take 10 tablets (40 mg) on days 1, 8, and 15 of chemo. Repeat every 21 days. 30 tablet 3  . ferrous sulfate 325 (65 FE) MG EC tablet Take 325 mg by mouth 3 (three) times daily with meals.    Marland Kitchen lenalidomide (REVLIMID) 20 MG capsule Take one capsule daily on days 1-14. Repeat every 21 days. 14 capsule 0  . lidocaine-prilocaine (EMLA) cream Apply to affected area once 30 g 3  . LORazepam (ATIVAN) 0.5 MG tablet Take 1 tablet (0.5 mg total) by mouth every 6 (six) hours as needed (Nausea or vomiting). 30 tablet 0  . Multiple Vitamin (MULTIVITAMIN WITH MINERALS) TABS Take 1 tablet by mouth daily.    . ondansetron (ZOFRAN) 8 MG tablet Take 1 tablet (8 mg total) by mouth 2 (two) times daily as needed (Nausea or vomiting). 30 tablet 1  . prochlorperazine (COMPAZINE) 10 MG tablet Take 1 tablet (10 mg total) by mouth every 6 (six) hours as needed (Nausea or vomiting). 30 tablet 1   No current  facility-administered medications for this visit.     PHYSICAL EXAMINATION: ECOG PERFORMANCE STATUS: 0 - Asymptomatic  There were no vitals filed for this visit. There is no height or weight on file to calculate BMI.  There were no vitals filed for this visit.  Wt Readings from Last 3 Encounters:  01/30/19 170 lb 1.9 oz (77.2 kg)  01/16/19 168 lb (76.2 kg)  12/20/18 170 lb 6.4 oz (77.3 kg)   Temp Readings from Last 3 Encounters:  01/30/19 98.3 F (36.8 C) (Oral)  01/23/19 97.7 F (36.5 C) (Oral)  01/16/19 97.8 F (36.6 C) (Oral)   BP Readings from Last 3 Encounters:  01/30/19 (!) 94/52  01/23/19 (!) 106/59  01/16/19 (!) 112/55   Pulse Readings from Last 3 Encounters:  01/30/19 (!) 55  01/23/19 65  01/16/19 72   GENERAL: alert, no distress and  comfortable SKIN: skin color, texture, turgor are normal, no rashes or significant lesions EYES: conjunctiva are pink and non-injected, sclera clear OROPHARYNX: no exudate, no erythema; lips, buccal mucosa, and tongue normal  NECK: supple, non-tender LUNGS: clear to auscultation with normal breathing effort HEART: regular rate & rhythm and no murmurs and no lower extremity edema ABDOMEN: soft, non-tender, non-distended, normal bowel sounds Musculoskeletal: no cyanosis of digits and no clubbing  PSYCH: alert & oriented x 3, fluent speech NEURO: no focal motor/sensory deficits  LABORATORY DATA:  I have reviewed the data as listed    Component Value Date/Time   NA 137 02/06/2019 0922   K 4.5 02/06/2019 0922   CL 104 02/06/2019 0922   CO2 28 02/06/2019 0922   GLUCOSE 112 (H) 02/06/2019 0922   BUN 22 02/06/2019 0922   CREATININE 0.94 02/06/2019 0922   CALCIUM 8.1 (L) 02/06/2019 0922   PROT 6.7 02/06/2019 0922   ALBUMIN 3.6 02/06/2019 0922   AST 24 02/06/2019 0922   ALT 25 02/06/2019 0922   ALKPHOS 61 02/06/2019 0922   BILITOT 0.6 02/06/2019 0922   GFRNONAA >60 02/06/2019 0922   GFRAA >60 02/06/2019 0922    No results found for: SPEP, UPEP  Lab Results  Component Value Date   WBC 1.8 (L) 02/06/2019   NEUTROABS 1.4 (L) 02/06/2019   HGB 10.5 (L) 02/06/2019   HCT 30.5 (L) 02/06/2019   MCV 103.0 (H) 02/06/2019   PLT 155 02/06/2019      Chemistry      Component Value Date/Time   NA 137 02/06/2019 0922   K 4.5 02/06/2019 0922   CL 104 02/06/2019 0922   CO2 28 02/06/2019 0922   BUN 22 02/06/2019 0922   CREATININE 0.94 02/06/2019 0922      Component Value Date/Time   CALCIUM 8.1 (L) 02/06/2019 0922   ALKPHOS 61 02/06/2019 0922   AST 24 02/06/2019 0922   ALT 25 02/06/2019 0922   BILITOT 0.6 02/06/2019 2072

## 2019-02-06 ENCOUNTER — Inpatient Hospital Stay: Payer: 59

## 2019-02-06 ENCOUNTER — Inpatient Hospital Stay (HOSPITAL_BASED_OUTPATIENT_CLINIC_OR_DEPARTMENT_OTHER): Payer: 59 | Admitting: Hematology

## 2019-02-06 ENCOUNTER — Other Ambulatory Visit: Payer: Self-pay

## 2019-02-06 ENCOUNTER — Encounter: Payer: Self-pay | Admitting: Hematology

## 2019-02-06 DIAGNOSIS — Z79899 Other long term (current) drug therapy: Secondary | ICD-10-CM

## 2019-02-06 DIAGNOSIS — Z5112 Encounter for antineoplastic immunotherapy: Secondary | ICD-10-CM | POA: Diagnosis not present

## 2019-02-06 DIAGNOSIS — C9000 Multiple myeloma not having achieved remission: Secondary | ICD-10-CM

## 2019-02-06 DIAGNOSIS — D701 Agranulocytosis secondary to cancer chemotherapy: Secondary | ICD-10-CM | POA: Diagnosis not present

## 2019-02-06 DIAGNOSIS — D6481 Anemia due to antineoplastic chemotherapy: Secondary | ICD-10-CM | POA: Diagnosis not present

## 2019-02-06 DIAGNOSIS — D6959 Other secondary thrombocytopenia: Secondary | ICD-10-CM | POA: Diagnosis not present

## 2019-02-06 DIAGNOSIS — T451X5A Adverse effect of antineoplastic and immunosuppressive drugs, initial encounter: Secondary | ICD-10-CM

## 2019-02-06 LAB — CBC WITH DIFFERENTIAL (CANCER CENTER ONLY)
Abs Immature Granulocytes: 0.02 10*3/uL (ref 0.00–0.07)
Basophils Absolute: 0 10*3/uL (ref 0.0–0.1)
Basophils Relative: 1 %
Eosinophils Absolute: 0 10*3/uL (ref 0.0–0.5)
Eosinophils Relative: 2 %
HCT: 30.5 % — ABNORMAL LOW (ref 39.0–52.0)
Hemoglobin: 10.5 g/dL — ABNORMAL LOW (ref 13.0–17.0)
Immature Granulocytes: 1 %
LYMPHS PCT: 13 %
Lymphs Abs: 0.2 10*3/uL — ABNORMAL LOW (ref 0.7–4.0)
MCH: 35.5 pg — ABNORMAL HIGH (ref 26.0–34.0)
MCHC: 34.4 g/dL (ref 30.0–36.0)
MCV: 103 fL — ABNORMAL HIGH (ref 80.0–100.0)
Monocytes Absolute: 0.1 10*3/uL (ref 0.1–1.0)
Monocytes Relative: 5 %
Neutro Abs: 1.4 10*3/uL — ABNORMAL LOW (ref 1.7–7.7)
Neutrophils Relative %: 78 %
Platelet Count: 155 10*3/uL (ref 150–400)
RBC: 2.96 MIL/uL — ABNORMAL LOW (ref 4.22–5.81)
RDW: 13.5 % (ref 11.5–15.5)
WBC Count: 1.8 10*3/uL — ABNORMAL LOW (ref 4.0–10.5)
nRBC: 0 % (ref 0.0–0.2)

## 2019-02-06 LAB — CMP (CANCER CENTER ONLY)
ALT: 25 U/L (ref 0–44)
AST: 24 U/L (ref 15–41)
Albumin: 3.6 g/dL (ref 3.5–5.0)
Alkaline Phosphatase: 61 U/L (ref 38–126)
Anion gap: 5 (ref 5–15)
BUN: 22 mg/dL (ref 8–23)
CO2: 28 mmol/L (ref 22–32)
Calcium: 8.1 mg/dL — ABNORMAL LOW (ref 8.9–10.3)
Chloride: 104 mmol/L (ref 98–111)
Creatinine: 0.94 mg/dL (ref 0.61–1.24)
GFR, Est AFR Am: >60 mL/min (ref >60)
GFR, Estimated: >60 mL/min (ref >60)
Glucose, Bld: 112 mg/dL — ABNORMAL HIGH (ref 70–99)
Potassium: 4.5 mmol/L (ref 3.5–5.1)
Sodium: 137 mmol/L (ref 135–145)
Total Bilirubin: 0.6 mg/dL (ref 0.3–1.2)
Total Protein: 6.7 g/dL (ref 6.5–8.1)

## 2019-02-06 MED ORDER — BORTEZOMIB CHEMO SQ INJECTION 3.5 MG (2.5MG/ML)
1.3000 mg/m2 | Freq: Once | INTRAMUSCULAR | Status: AC
  Administered 2019-02-06: 2.5 mg via SUBCUTANEOUS
  Filled 2019-02-06: qty 1

## 2019-02-06 NOTE — Patient Instructions (Signed)

## 2019-02-06 NOTE — Progress Notes (Signed)
Ok to treat per Dr. Maylon Peppers after reviewing labwork

## 2019-02-07 ENCOUNTER — Other Ambulatory Visit: Payer: Self-pay

## 2019-02-07 DIAGNOSIS — C9000 Multiple myeloma not having achieved remission: Secondary | ICD-10-CM

## 2019-02-07 MED ORDER — LENALIDOMIDE 20 MG PO CAPS: ORAL_CAPSULE | ORAL | 0 refills | Status: DC

## 2019-02-13 ENCOUNTER — Inpatient Hospital Stay: Payer: 59

## 2019-02-13 ENCOUNTER — Other Ambulatory Visit: Payer: Self-pay

## 2019-02-13 VITALS — BP 98/50 | HR 60 | Temp 97.9°F | Resp 20

## 2019-02-13 DIAGNOSIS — C9000 Multiple myeloma not having achieved remission: Secondary | ICD-10-CM

## 2019-02-13 DIAGNOSIS — Z5112 Encounter for antineoplastic immunotherapy: Secondary | ICD-10-CM | POA: Diagnosis not present

## 2019-02-13 LAB — CMP (CANCER CENTER ONLY)
ALT: 32 U/L (ref 0–44)
AST: 47 U/L — ABNORMAL HIGH (ref 15–41)
Albumin: 3.4 g/dL — ABNORMAL LOW (ref 3.5–5.0)
Alkaline Phosphatase: 53 U/L (ref 38–126)
Anion gap: 6 (ref 5–15)
BUN: 30 mg/dL — ABNORMAL HIGH (ref 8–23)
CO2: 26 mmol/L (ref 22–32)
Calcium: 7.8 mg/dL — ABNORMAL LOW (ref 8.9–10.3)
Chloride: 108 mmol/L (ref 98–111)
Creatinine: 1 mg/dL (ref 0.61–1.24)
GFR, Est AFR Am: >60 mL/min (ref >60)
GFR, Estimated: >60 mL/min (ref >60)
Glucose, Bld: 112 mg/dL — ABNORMAL HIGH (ref 70–99)
Potassium: 4.6 mmol/L (ref 3.5–5.1)
Sodium: 140 mmol/L (ref 135–145)
Total Bilirubin: 0.6 mg/dL (ref 0.3–1.2)
Total Protein: 5.7 g/dL — ABNORMAL LOW (ref 6.5–8.1)

## 2019-02-13 LAB — CBC WITH DIFFERENTIAL (CANCER CENTER ONLY)
Abs Immature Granulocytes: 0.01 10*3/uL (ref 0.00–0.07)
Basophils Absolute: 0 10*3/uL (ref 0.0–0.1)
Basophils Relative: 0 %
Eosinophils Absolute: 0 10*3/uL (ref 0.0–0.5)
Eosinophils Relative: 1 %
HCT: 27.8 % — ABNORMAL LOW (ref 39.0–52.0)
HEMOGLOBIN: 9.4 g/dL — AB (ref 13.0–17.0)
IMMATURE GRANULOCYTES: 0 %
Lymphocytes Relative: 10 %
Lymphs Abs: 0.3 10*3/uL — ABNORMAL LOW (ref 0.7–4.0)
MCH: 35.3 pg — ABNORMAL HIGH (ref 26.0–34.0)
MCHC: 33.8 g/dL (ref 30.0–36.0)
MCV: 104.5 fL — ABNORMAL HIGH (ref 80.0–100.0)
Monocytes Absolute: 0.4 10*3/uL (ref 0.1–1.0)
Monocytes Relative: 12 %
Neutro Abs: 2.6 10*3/uL (ref 1.7–7.7)
Neutrophils Relative %: 77 %
Platelet Count: 143 10*3/uL — ABNORMAL LOW (ref 150–400)
RBC: 2.66 MIL/uL — ABNORMAL LOW (ref 4.22–5.81)
RDW: 13.6 % (ref 11.5–15.5)
WBC Count: 3.3 10*3/uL — ABNORMAL LOW (ref 4.0–10.5)
nRBC: 0 % (ref 0.0–0.2)

## 2019-02-13 MED ORDER — BORTEZOMIB CHEMO SQ INJECTION 3.5 MG (2.5MG/ML)
1.3000 mg/m2 | Freq: Once | INTRAMUSCULAR | Status: AC
  Administered 2019-02-13: 2.5 mg via SUBCUTANEOUS
  Filled 2019-02-13: qty 1

## 2019-02-13 MED ORDER — PROCHLORPERAZINE MALEATE 10 MG PO TABS: 10.0000 mg | ORAL_TABLET | Freq: Once | ORAL | Status: DC

## 2019-02-19 ENCOUNTER — Other Ambulatory Visit: Payer: Self-pay | Admitting: Hematology

## 2019-02-19 DIAGNOSIS — C9000 Multiple myeloma not having achieved remission: Secondary | ICD-10-CM

## 2019-02-19 NOTE — Progress Notes (Signed)
[place] OFFICE PROGRESS NOTE  Patient Care Team: Patient, No Pcp Per as PCP - General (General Practice) Cordelia Poche, RN as Oncology Nurse Navigator [name], MD as Medical Oncologist (Hematology)  HEME/ONC OVERVIEW: 1. 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 and DS  -10/2018: MRI of the right shoulder for arm pain showed innumerable enhancing lesions throughout the right humerus, ribs and right scapula; no fractures -11/2018: baseline labs  Hgb 10, Ca 12.2, Cr 2.89, albumin 2.9  M-spike 3.7 g/dL, free lambda 7.8, monoclonal IgG lambda on IFE, quant IgG 5602, LDH 180, B2M 6.8  PET showed innumerable lytic lesions involving the axial and proximal appendicular skeleton and calvarium; no plasmacytoma   BM bx: normocellular marrow with plasma cell neoplasm (~64% of all cells); myeloma FISH showed trisomy 11 and gain of ATM gene (standard risk)  2. Myeloma-associated bone disease -S/p Xgeva x 1 for Ca 12.2; Zometa (insurance-preferred) on hold, pending dental evaluation  TREATMENT REGIMEN:  01/09/2019 - present: q21day RVd; Revlimid dose reduced to 66m daily due to fluctuating renal function  -C1 (2/20), C2 (3/12), C3 (4/2)  ASSESSMENT & PLAN:   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 and DS  -S/p 2 cycles of RVd -MM labs pending today  -Labs otherwise adequate today, proceed with C3D1 of chemotherapy on 02/20/2019 -[facility] [place] [facility] [place] -VZV prophylaxis: acyclovir -PRN anti-emetics: Zofran, Compazine, and Ativan   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 -S/p Xgeva x 1 for hypercalcemia -Patient had dental extraction on 02/11/2019 (15 teeth removed), and there is plan for additional dental procedure, but there is no definite date scheduled yet due to COVID-19 outbreak and his dentist office being closed -I provided  the patient my contact information so that I can discuss his dental plan with his dentist -Due to the risk of ONJ, I would like to make certain that there is no additional major dental procedure planned before resuming bone protecting agent  -On Ca-Vit D supplement BID -Of note, he will require bisphosphonate due to his insurance preference   Chemotherapy-associated leukopenia -Secondary to chemotherapy -WBC 1.6k with ANC 1300, slightly lower than last week  -Patient denies any symptoms of infection -We will monitor for now; no indication for dose adjustment  Chemotherapy-associated anemia -Secondary to chemotherapy -Hgb 11.3, improving -Patient denies any symptom of bleeding -We will monitor for now; no indication for dose adjustment  Chemotherapy-associated thrombocytopenia -Secondary to chemotherapy -Plts 134k, stable -Patient denies any symptoms of bleeding or excess bruising, such as epistaxis, hematochezia, melena, or hematuria -We will monitor for now; no indication for dose adjustment  Left shoulder pain -I reviewed the recent PET images, which showed a lesion in the left humerus  -I discussed with patient about any imaging studies, including MRI, but even if it demonstrates a tendon or muscle injury, any intervention would likely be delayed due to ongoing chemotherapy -I counseled the patient on any concerning symptoms, including worsening or persistent pain, interference with ADL's, swelling, or change in the sensation or strength in the LUE, for which he should contact the clinic promptly -Patient declined opioid pain medication and muscle relaxant  -Bone protecting agent as discussed above   No orders of the defined types were placed in this encounter.  All questions were answered. The patient knows to call the clinic with any problems, questions or  concerns. No barriers to learning was detected.  Return in 3 weeks on 03/13/2019 at the start of C4 of RVd for toxicity  checks.   [name], MD 02/20/2019 10:56 AM  CHIEF COMPLAINT: "I am doing fine"  INTERVAL HISTORY: [name] returns to clinic for follow-up of multiple myeloma on RVD.  Patient reports that he underwent partial mouth extraction (approximately 15 teeth removed) on 02/11/2019.  He has been recovering relatively well from the procedure.  There is plan for additional dental intervention, but the date has not yet been scheduled due to his dentist office being closed in light of COVID-19 outbreak.  He also reports that he has mild to moderate, intermittent, left shoulder soreness, usually triggered by exertion, but he denies any persistent pain, swelling, or weakness/sensation change in the hand.  He otherwise reports doing well with the treatment, denies any other complaint.  SUMMARY OF ONCOLOGIC HISTORY:   Multiple myeloma ([place])   11/11/2018 Imaging    MRI right shoulder for arm pain showed innumerable enhancing lesions throughout the right humerus, ribs and right scapula; no fractures    11/29/2018 Miscellaneous    Baseline labs -Hgb 10, Ca 12.2, Cr 2.89, albumin 2.9 -M-spike 3.7 g/dL, free lambda 7.8, monoclonal IgG lambda on IFE, quant IgG 5602, LDH 180, B2M 6.8    01/09/2019 -  Chemotherapy    The patient had bortezomib SQ (VELCADE) chemo injection 2.5 mg, 1.3 mg/m2 = 2.5 mg, Subcutaneous,  Once, 3 of 7 cycles Administration: 2.5 mg (01/09/2019), 2.5 mg (01/16/2019), 2.5 mg (01/23/2019), 2.5 mg (01/30/2019), 2.5 mg (02/06/2019), 2.5 mg (02/13/2019)  for chemotherapy treatment.      REVIEW OF SYSTEMS:   Constitutional: ( - ) fevers, ( - )  chills , ( - ) night sweats Eyes: ( - ) blurriness of vision, ( - ) double vision, ( - ) watery eyes Ears, nose, mouth, throat, and face: ( - ) mucositis, ( - ) sore throat Respiratory: ( - ) cough, ( - ) dyspnea, ( - ) wheezes Cardiovascular: ( - ) palpitation, ( - ) chest discomfort, ( - ) lower extremity swelling Gastrointestinal:  ( - ) nausea, ( - )  heartburn, ( - ) change in bowel habits Skin: ( - ) abnormal skin rashes Lymphatics: ( - ) new lymphadenopathy, ( - ) easy bruising Neurological: ( - ) numbness, ( - ) tingling, ( - ) new weaknesses Behavioral/Psych: ( - ) mood change, ( - ) new changes  All other systems were reviewed with the patient and are negative.  I have reviewed the past medical history, past surgical history, social history and family history with the patient and they are unchanged from previous note.  ALLERGIES:  has No Known Allergies.  MEDICATIONS:  Current Outpatient Medications  Medication Sig Dispense Refill  . acyclovir (ZOVIRAX) 400 MG tablet Take 1 tablet (400 mg total) by mouth 2 (two) times daily. 60 tablet 3  . aspirin EC 81 MG tablet Take 81 mg by mouth daily.    . calcium acetate (PHOSLO) 667 MG capsule Take 667 mg by mouth 3 (three) times daily.    . chlorhexidine (PERIDEX) 0.12 % solution     . dexamethasone (DECADRON) 4 MG tablet Take 10 tablets (40 mg) on days 1, 8, and 15 of chemo. Repeat every 21 days. 30 tablet 3  . ferrous sulfate 325 (65 FE) MG EC tablet Take 325 mg by mouth 3 (three) times daily with meals.    Marland Kitchen  lidocaine-prilocaine (EMLA) cream Apply to affected area once 30 g 3  . LORazepam (ATIVAN) 0.5 MG tablet Take 1 tablet (0.5 mg total) by mouth every 6 (six) hours as needed (Nausea or vomiting). 30 tablet 0  . Multiple Vitamin (MULTIVITAMIN WITH MINERALS) TABS Take 1 tablet by mouth daily.    . ondansetron (ZOFRAN) 8 MG tablet Take 1 tablet (8 mg total) by mouth 2 (two) times daily as needed (Nausea or vomiting). 30 tablet 1  . prochlorperazine (COMPAZINE) 10 MG tablet Take 1 tablet (10 mg total) by mouth every 6 (six) hours as needed (Nausea or vomiting). 30 tablet 1  . REVLIMID 20 MG capsule TAKE 1 CAPSULE BY MOUTH  EVERY DAY FOR 14 DAYS, THEN 7 DAYS OFF 14 capsule 0   No current facility-administered medications for this visit.    Facility-Administered Medications Ordered in  Other Visits  Medication Dose Route Frequency Provider Last Rate Last Dose  . bortezomib SQ (VELCADE) chemo injection 2.5 mg  1.3 mg/m2 (Treatment Plan Recorded) Subcutaneous Once [name], MD      . prochlorperazine (COMPAZINE) tablet 10 mg  10 mg Oral Once [name], MD        PHYSICAL EXAMINATION: ECOG PERFORMANCE STATUS: 0 - Asymptomatic  Today's Vitals   02/20/19 1000  BP: (!) 98/59  Pulse: (!) 52  Resp: 16  Temp: 98 F (36.7 C)  TempSrc: Oral  SpO2: 100%  Weight: 165 lb (74.8 kg)  PainSc: 4    Body mass index is 24.37 kg/m.  Filed Weights   02/20/19 1000  Weight: 165 lb (74.8 kg)    GENERAL: alert, no distress and comfortable SKIN: skin color, texture, turgor are normal, no rashes or significant lesions EYES: conjunctiva are pink and non-injected, sclera clear OROPHARYNX: no exudate, no erythema; lips, buccal mucosa, and tongue normal  NECK: supple, non-tender LYMPH:  no palpable lymphadenopathy in the cervical LUNGS: clear to auscultation with normal breathing effort HEART: regular rate & rhythm and no murmurs and no lower extremity edema ABDOMEN: soft, non-tender, non-distended, normal bowel sounds Musculoskeletal: no cyanosis of digits and no clubbing  PSYCH: alert & oriented x 3, fluent speech NEURO: no focal motor/sensory deficits  LABORATORY DATA:  I have reviewed the data as listed    Component Value Date/Time   NA 138 02/20/2019 0945   K 4.3 02/20/2019 0945   CL 105 02/20/2019 0945   CO2 27 02/20/2019 0945   GLUCOSE 102 (H) 02/20/2019 0945   BUN 25 (H) 02/20/2019 0945   CREATININE 0.86 02/20/2019 0945   CALCIUM 8.4 (L) 02/20/2019 0945   PROT 7.1 02/20/2019 0945   ALBUMIN 4.0 02/20/2019 0945   AST 21 02/20/2019 0945   ALT 27 02/20/2019 0945   ALKPHOS 65 02/20/2019 0945   BILITOT 0.5 02/20/2019 0945   GFRNONAA >60 02/20/2019 0945   GFRAA >60 02/20/2019 0945    No results found for: SPEP, UPEP  Lab Results  Component Value Date   WBC 1.6  (L) 02/20/2019   NEUTROABS 1.3 (L) 02/20/2019   HGB 11.3 (L) 02/20/2019   HCT 33.3 (L) 02/20/2019   MCV 100.9 (H) 02/20/2019   PLT 134 (L) 02/20/2019      Chemistry      Component Value Date/Time   NA 138 02/20/2019 0945   K 4.3 02/20/2019 0945   CL 105 02/20/2019 0945   CO2 27 02/20/2019 0945   BUN 25 (H) 02/20/2019 0945   CREATININE 0.86 02/20/2019 0945  Component Value Date/Time   CALCIUM 8.4 (L) 02/20/2019 0945   ALKPHOS 65 02/20/2019 0945   AST 21 02/20/2019 0945   ALT 27 02/20/2019 0945   BILITOT 0.5 02/20/2019 0945

## 2019-02-20 ENCOUNTER — Other Ambulatory Visit: Payer: Self-pay

## 2019-02-20 ENCOUNTER — Inpatient Hospital Stay (HOSPITAL_BASED_OUTPATIENT_CLINIC_OR_DEPARTMENT_OTHER): Payer: 59 | Admitting: Hematology

## 2019-02-20 ENCOUNTER — Telehealth: Payer: Self-pay | Admitting: Hematology

## 2019-02-20 ENCOUNTER — Encounter: Payer: Self-pay | Admitting: Hematology

## 2019-02-20 ENCOUNTER — Inpatient Hospital Stay: Payer: 59 | Attending: Hematology

## 2019-02-20 ENCOUNTER — Inpatient Hospital Stay: Payer: Self-pay

## 2019-02-20 ENCOUNTER — Inpatient Hospital Stay: Payer: 59

## 2019-02-20 VITALS — BP 98/59 | HR 52 | Temp 98.0°F | Resp 16 | Wt 165.0 lb

## 2019-02-20 DIAGNOSIS — D61810 Antineoplastic chemotherapy induced pancytopenia: Secondary | ICD-10-CM

## 2019-02-20 DIAGNOSIS — T451X5A Adverse effect of antineoplastic and immunosuppressive drugs, initial encounter: Secondary | ICD-10-CM

## 2019-02-20 DIAGNOSIS — C9000 Multiple myeloma not having achieved remission: Secondary | ICD-10-CM | POA: Diagnosis not present

## 2019-02-20 DIAGNOSIS — Z5112 Encounter for antineoplastic immunotherapy: Secondary | ICD-10-CM | POA: Insufficient documentation

## 2019-02-20 DIAGNOSIS — M25512 Pain in left shoulder: Secondary | ICD-10-CM

## 2019-02-20 DIAGNOSIS — D701 Agranulocytosis secondary to cancer chemotherapy: Secondary | ICD-10-CM

## 2019-02-20 DIAGNOSIS — D6481 Anemia due to antineoplastic chemotherapy: Secondary | ICD-10-CM

## 2019-02-20 DIAGNOSIS — D6959 Other secondary thrombocytopenia: Secondary | ICD-10-CM

## 2019-02-20 LAB — CBC WITH DIFFERENTIAL (CANCER CENTER ONLY)
Abs Immature Granulocytes: 0 10*3/uL (ref 0.00–0.07)
Basophils Absolute: 0 10*3/uL (ref 0.0–0.1)
Basophils Relative: 1 %
Eosinophils Absolute: 0 10*3/uL (ref 0.0–0.5)
Eosinophils Relative: 1 %
HCT: 33.3 % — ABNORMAL LOW (ref 39.0–52.0)
Hemoglobin: 11.3 g/dL — ABNORMAL LOW (ref 13.0–17.0)
Immature Granulocytes: 0 %
Lymphocytes Relative: 12 %
Lymphs Abs: 0.2 10*3/uL — ABNORMAL LOW (ref 0.7–4.0)
MCH: 34.2 pg — ABNORMAL HIGH (ref 26.0–34.0)
MCHC: 33.9 g/dL (ref 30.0–36.0)
MCV: 100.9 fL — ABNORMAL HIGH (ref 80.0–100.0)
Monocytes Absolute: 0.1 10*3/uL (ref 0.1–1.0)
Monocytes Relative: 6 %
Neutro Abs: 1.3 10*3/uL — ABNORMAL LOW (ref 1.7–7.7)
Neutrophils Relative %: 80 %
Platelet Count: 134 10*3/uL — ABNORMAL LOW (ref 150–400)
RBC: 3.3 MIL/uL — ABNORMAL LOW (ref 4.22–5.81)
RDW: 12.9 % (ref 11.5–15.5)
WBC Count: 1.6 10*3/uL — ABNORMAL LOW (ref 4.0–10.5)
nRBC: 0 % (ref 0.0–0.2)

## 2019-02-20 LAB — CMP (CANCER CENTER ONLY)
ALT: 27 U/L (ref 0–44)
AST: 21 U/L (ref 15–41)
Albumin: 4 g/dL (ref 3.5–5.0)
Alkaline Phosphatase: 65 U/L (ref 38–126)
Anion gap: 6 (ref 5–15)
BUN: 25 mg/dL — ABNORMAL HIGH (ref 8–23)
CO2: 27 mmol/L (ref 22–32)
Calcium: 8.4 mg/dL — ABNORMAL LOW (ref 8.9–10.3)
Chloride: 105 mmol/L (ref 98–111)
Creatinine: 0.86 mg/dL (ref 0.61–1.24)
GFR, Est AFR Am: >60 mL/min (ref >60)
GFR, Estimated: >60 mL/min (ref >60)
Glucose, Bld: 102 mg/dL — ABNORMAL HIGH (ref 70–99)
Potassium: 4.3 mmol/L (ref 3.5–5.1)
Sodium: 138 mmol/L (ref 135–145)
Total Bilirubin: 0.5 mg/dL (ref 0.3–1.2)
Total Protein: 7.1 g/dL (ref 6.5–8.1)

## 2019-02-20 MED ORDER — BORTEZOMIB CHEMO SQ INJECTION 3.5 MG (2.5MG/ML)
1.3000 mg/m2 | Freq: Once | INTRAMUSCULAR | Status: AC
  Administered 2019-02-20: 2.5 mg via SUBCUTANEOUS
  Filled 2019-02-20: qty 1

## 2019-02-20 MED ORDER — PROCHLORPERAZINE MALEATE 10 MG PO TABS: 10.0000 mg | ORAL_TABLET | Freq: Once | ORAL | Status: DC

## 2019-02-20 NOTE — Progress Notes (Signed)
Ok to treat with ANC 1.3 per Dr. Maylon Peppers

## 2019-02-20 NOTE — Telephone Encounter (Signed)
Appointments added patient to get updated schedule at next visit per 4/2 los

## 2019-02-20 NOTE — Patient Instructions (Signed)
[facility] Discharge Instructions for Patients Receiving Chemotherapy  Today you received the following chemotherapy agents Velcade To help prevent nausea and vomiting after your treatment, we encourage you to take your nausea medication as prescribed.   If you develop nausea and vomiting that is not controlled by your nausea medication, call the clinic.   BELOW ARE SYMPTOMS THAT SHOULD BE REPORTED IMMEDIATELY:  *FEVER GREATER THAN 100.5 F  *CHILLS WITH OR WITHOUT FEVER  NAUSEA AND VOMITING THAT IS NOT CONTROLLED WITH YOUR NAUSEA MEDICATION  *UNUSUAL SHORTNESS OF BREATH  *UNUSUAL BRUISING OR BLEEDING  TENDERNESS IN MOUTH AND THROAT WITH OR WITHOUT PRESENCE OF ULCERS  *URINARY PROBLEMS  *BOWEL PROBLEMS  UNUSUAL RASH Items with * indicate a potential emergency and should be followed up as soon as possible.  Feel free to call the clinic should you have any questions or concerns. The clinic phone number is [phone].  Please show the CHEMO ALERT CARD at check-in to the Emergency Department and triage nurse.   

## 2019-02-21 LAB — MULTIPLE MYELOMA PANEL, SERUM
Albumin SerPl Elph-Mcnc: 3.5 g/dL (ref 2.9–4.4)
Albumin/Glob SerPl: 1.1 (ref 0.7–1.7)
Alpha 1: 0.3 g/dL (ref 0.0–0.4)
Alpha2 Glob SerPl Elph-Mcnc: 0.7 g/dL (ref 0.4–1.0)
B-Globulin SerPl Elph-Mcnc: 1 g/dL (ref 0.7–1.3)
Gamma Glob SerPl Elph-Mcnc: 1.4 g/dL (ref 0.4–1.8)
Globulin, Total: 3.3 g/dL (ref 2.2–3.9)
IgA: 16 mg/dL — ABNORMAL LOW (ref 61–437)
IgG (Immunoglobin G), Serum: 1626 mg/dL — ABNORMAL HIGH (ref 603–1613)
IgM (Immunoglobulin M), Srm: 29 mg/dL (ref 20–172)
M Protein SerPl Elph-Mcnc: 1.1 g/dL — ABNORMAL HIGH
Total Protein ELP: 6.8 g/dL (ref 6.0–8.5)

## 2019-02-21 LAB — KAPPA/LAMBDA LIGHT CHAINS
Kappa free light chain: 12.7 mg/L (ref 3.3–19.4)
Kappa, lambda light chain ratio: 2.02 — ABNORMAL HIGH (ref 0.26–1.65)
Lambda free light chains: 6.3 mg/L (ref 5.7–26.3)

## 2019-02-24 ENCOUNTER — Other Ambulatory Visit: Payer: Self-pay | Admitting: *Deleted

## 2019-02-24 DIAGNOSIS — C9000 Multiple myeloma not having achieved remission: Secondary | ICD-10-CM

## 2019-02-24 MED ORDER — LENALIDOMIDE 20 MG PO CAPS: 20.0000 mg | ORAL_CAPSULE | Freq: Every day | ORAL | 0 refills | Status: DC

## 2019-02-27 ENCOUNTER — Inpatient Hospital Stay: Payer: 59

## 2019-02-27 ENCOUNTER — Other Ambulatory Visit: Payer: Self-pay

## 2019-02-27 VITALS — BP 102/59 | HR 62 | Temp 98.0°F | Resp 16

## 2019-02-27 DIAGNOSIS — C9000 Multiple myeloma not having achieved remission: Secondary | ICD-10-CM

## 2019-02-27 DIAGNOSIS — Z5112 Encounter for antineoplastic immunotherapy: Secondary | ICD-10-CM | POA: Diagnosis not present

## 2019-02-27 LAB — CMP (CANCER CENTER ONLY)
ALT: 22 U/L (ref 0–44)
AST: 21 U/L (ref 15–41)
Albumin: 3.9 g/dL (ref 3.5–5.0)
Alkaline Phosphatase: 74 U/L (ref 38–126)
Anion gap: 7 (ref 5–15)
BUN: 23 mg/dL (ref 8–23)
CO2: 27 mmol/L (ref 22–32)
Calcium: 8.6 mg/dL — ABNORMAL LOW (ref 8.9–10.3)
Chloride: 104 mmol/L (ref 98–111)
Creatinine: 0.91 mg/dL (ref 0.61–1.24)
GFR, Est AFR Am: >60 mL/min (ref >60)
GFR, Estimated: >60 mL/min (ref >60)
Glucose, Bld: 97 mg/dL (ref 70–99)
Potassium: 4.3 mmol/L (ref 3.5–5.1)
Sodium: 138 mmol/L (ref 135–145)
Total Bilirubin: 0.6 mg/dL (ref 0.3–1.2)
Total Protein: 6.7 g/dL (ref 6.5–8.1)

## 2019-02-27 LAB — CBC WITH DIFFERENTIAL (CANCER CENTER ONLY)
Abs Immature Granulocytes: 0.01 10*3/uL (ref 0.00–0.07)
Basophils Absolute: 0 10*3/uL (ref 0.0–0.1)
Basophils Relative: 1 %
Eosinophils Absolute: 0 10*3/uL (ref 0.0–0.5)
Eosinophils Relative: 1 %
HCT: 33.8 % — ABNORMAL LOW (ref 39.0–52.0)
Hemoglobin: 11.6 g/dL — ABNORMAL LOW (ref 13.0–17.0)
Immature Granulocytes: 0 %
Lymphocytes Relative: 11 %
Lymphs Abs: 0.3 10*3/uL — ABNORMAL LOW (ref 0.7–4.0)
MCH: 34.1 pg — ABNORMAL HIGH (ref 26.0–34.0)
MCHC: 34.3 g/dL (ref 30.0–36.0)
MCV: 99.4 fL (ref 80.0–100.0)
Monocytes Absolute: 0.1 10*3/uL (ref 0.1–1.0)
Monocytes Relative: 3 %
Neutro Abs: 1.9 10*3/uL (ref 1.7–7.7)
Neutrophils Relative %: 84 %
Platelet Count: 195 10*3/uL (ref 150–400)
RBC: 3.4 MIL/uL — ABNORMAL LOW (ref 4.22–5.81)
RDW: 12.6 % (ref 11.5–15.5)
WBC Count: 2.4 10*3/uL — ABNORMAL LOW (ref 4.0–10.5)
nRBC: 0 % (ref 0.0–0.2)

## 2019-02-27 MED ORDER — BORTEZOMIB CHEMO SQ INJECTION 3.5 MG (2.5MG/ML)
1.3000 mg/m2 | Freq: Once | INTRAMUSCULAR | Status: AC
  Administered 2019-02-27: 2.5 mg via SUBCUTANEOUS
  Filled 2019-02-27: qty 1

## 2019-02-27 NOTE — Patient Instructions (Signed)

## 2019-03-06 ENCOUNTER — Inpatient Hospital Stay: Payer: 59

## 2019-03-06 ENCOUNTER — Other Ambulatory Visit: Payer: Self-pay

## 2019-03-06 VITALS — BP 98/49 | HR 57 | Temp 98.7°F | Resp 17

## 2019-03-06 DIAGNOSIS — C9000 Multiple myeloma not having achieved remission: Secondary | ICD-10-CM

## 2019-03-06 DIAGNOSIS — Z5112 Encounter for antineoplastic immunotherapy: Secondary | ICD-10-CM | POA: Diagnosis not present

## 2019-03-06 LAB — CMP (CANCER CENTER ONLY)
ALT: 25 U/L (ref 0–44)
AST: 22 U/L (ref 15–41)
Albumin: 4 g/dL (ref 3.5–5.0)
Alkaline Phosphatase: 68 U/L (ref 38–126)
Anion gap: 4 — ABNORMAL LOW (ref 5–15)
BUN: 17 mg/dL (ref 8–23)
CO2: 28 mmol/L (ref 22–32)
Calcium: 9.2 mg/dL (ref 8.9–10.3)
Chloride: 103 mmol/L (ref 98–111)
Creatinine: 0.88 mg/dL (ref 0.61–1.24)
GFR, Est AFR Am: >60 mL/min (ref >60)
GFR, Estimated: >60 mL/min (ref >60)
Glucose, Bld: 105 mg/dL — ABNORMAL HIGH (ref 70–99)
Potassium: 4.2 mmol/L (ref 3.5–5.1)
Sodium: 135 mmol/L (ref 135–145)
Total Bilirubin: 0.5 mg/dL (ref 0.3–1.2)
Total Protein: 6.4 g/dL — ABNORMAL LOW (ref 6.5–8.1)

## 2019-03-06 LAB — CBC WITH DIFFERENTIAL (CANCER CENTER ONLY)
Abs Immature Granulocytes: 0.01 10*3/uL (ref 0.00–0.07)
Basophils Absolute: 0 10*3/uL (ref 0.0–0.1)
Basophils Relative: 2 %
Eosinophils Absolute: 0 10*3/uL (ref 0.0–0.5)
Eosinophils Relative: 2 %
HCT: 33.4 % — ABNORMAL LOW (ref 39.0–52.0)
Hemoglobin: 11.5 g/dL — ABNORMAL LOW (ref 13.0–17.0)
Immature Granulocytes: 0 %
Lymphocytes Relative: 10 %
Lymphs Abs: 0.2 10*3/uL — ABNORMAL LOW (ref 0.7–4.0)
MCH: 33.9 pg (ref 26.0–34.0)
MCHC: 34.4 g/dL (ref 30.0–36.0)
MCV: 98.5 fL (ref 80.0–100.0)
Monocytes Absolute: 0.2 10*3/uL (ref 0.1–1.0)
Monocytes Relative: 7 %
Neutro Abs: 1.9 10*3/uL (ref 1.7–7.7)
Neutrophils Relative %: 79 %
Platelet Count: 187 10*3/uL (ref 150–400)
RBC: 3.39 MIL/uL — ABNORMAL LOW (ref 4.22–5.81)
RDW: 12.7 % (ref 11.5–15.5)
WBC Count: 2.5 10*3/uL — ABNORMAL LOW (ref 4.0–10.5)
nRBC: 0 % (ref 0.0–0.2)

## 2019-03-06 MED ORDER — PROCHLORPERAZINE MALEATE 10 MG PO TABS: 10.0000 mg | ORAL_TABLET | Freq: Once | ORAL | Status: DC

## 2019-03-06 MED ORDER — BORTEZOMIB CHEMO SQ INJECTION 3.5 MG (2.5MG/ML)
1.3000 mg/m2 | Freq: Once | INTRAMUSCULAR | Status: AC
  Administered 2019-03-06: 2.5 mg via SUBCUTANEOUS
  Filled 2019-03-06: qty 1

## 2019-03-06 NOTE — Patient Instructions (Signed)

## 2019-03-10 ENCOUNTER — Other Ambulatory Visit: Payer: Self-pay | Admitting: Hematology & Oncology

## 2019-03-10 DIAGNOSIS — C9000 Multiple myeloma not having achieved remission: Secondary | ICD-10-CM

## 2019-03-11 ENCOUNTER — Other Ambulatory Visit: Payer: Self-pay | Admitting: *Deleted

## 2019-03-13 ENCOUNTER — Encounter: Payer: Self-pay | Admitting: Hematology

## 2019-03-13 ENCOUNTER — Inpatient Hospital Stay: Payer: 59

## 2019-03-13 ENCOUNTER — Other Ambulatory Visit: Payer: Self-pay

## 2019-03-13 ENCOUNTER — Inpatient Hospital Stay: Payer: 59 | Admitting: Hematology

## 2019-03-13 ENCOUNTER — Telehealth: Payer: Self-pay | Admitting: Hematology

## 2019-03-13 VITALS — BP 96/56 | HR 56 | Temp 98.5°F | Resp 18 | Ht 69.0 in | Wt 165.0 lb

## 2019-03-13 DIAGNOSIS — D701 Agranulocytosis secondary to cancer chemotherapy: Secondary | ICD-10-CM

## 2019-03-13 DIAGNOSIS — C9000 Multiple myeloma not having achieved remission: Secondary | ICD-10-CM | POA: Diagnosis not present

## 2019-03-13 DIAGNOSIS — D6481 Anemia due to antineoplastic chemotherapy: Secondary | ICD-10-CM

## 2019-03-13 DIAGNOSIS — Z5112 Encounter for antineoplastic immunotherapy: Secondary | ICD-10-CM | POA: Diagnosis not present

## 2019-03-13 DIAGNOSIS — D6959 Other secondary thrombocytopenia: Secondary | ICD-10-CM

## 2019-03-13 DIAGNOSIS — T451X5A Adverse effect of antineoplastic and immunosuppressive drugs, initial encounter: Secondary | ICD-10-CM

## 2019-03-13 DIAGNOSIS — D61818 Other pancytopenia: Secondary | ICD-10-CM | POA: Diagnosis not present

## 2019-03-13 LAB — CMP (CANCER CENTER ONLY)
ALT: 21 U/L (ref 0–44)
AST: 22 U/L (ref 15–41)
Albumin: 4 g/dL (ref 3.5–5.0)
Alkaline Phosphatase: 75 U/L (ref 38–126)
Anion gap: 5 (ref 5–15)
BUN: 24 mg/dL — ABNORMAL HIGH (ref 8–23)
CO2: 28 mmol/L (ref 22–32)
Calcium: 9.2 mg/dL (ref 8.9–10.3)
Chloride: 105 mmol/L (ref 98–111)
Creatinine: 0.84 mg/dL (ref 0.61–1.24)
GFR, Est AFR Am: >60 mL/min (ref >60)
GFR, Estimated: >60 mL/min (ref >60)
Glucose, Bld: 111 mg/dL — ABNORMAL HIGH (ref 70–99)
Potassium: 4.1 mmol/L (ref 3.5–5.1)
Sodium: 138 mmol/L (ref 135–145)
Total Bilirubin: 0.5 mg/dL (ref 0.3–1.2)
Total Protein: 6.7 g/dL (ref 6.5–8.1)

## 2019-03-13 LAB — CBC WITH DIFFERENTIAL (CANCER CENTER ONLY)
Abs Immature Granulocytes: 0.01 10*3/uL (ref 0.00–0.07)
Basophils Absolute: 0 10*3/uL (ref 0.0–0.1)
Basophils Relative: 2 %
Eosinophils Absolute: 0 10*3/uL (ref 0.0–0.5)
Eosinophils Relative: 0 %
HCT: 34.8 % — ABNORMAL LOW (ref 39.0–52.0)
Hemoglobin: 11.9 g/dL — ABNORMAL LOW (ref 13.0–17.0)
Immature Granulocytes: 0 %
Lymphocytes Relative: 12 %
Lymphs Abs: 0.3 10*3/uL — ABNORMAL LOW (ref 0.7–4.0)
MCH: 33.4 pg (ref 26.0–34.0)
MCHC: 34.2 g/dL (ref 30.0–36.0)
MCV: 97.8 fL (ref 80.0–100.0)
Monocytes Absolute: 0.1 10*3/uL (ref 0.1–1.0)
Monocytes Relative: 4 %
Neutro Abs: 2 10*3/uL (ref 1.7–7.7)
Neutrophils Relative %: 82 %
Platelet Count: 137 10*3/uL — ABNORMAL LOW (ref 150–400)
RBC: 3.56 MIL/uL — ABNORMAL LOW (ref 4.22–5.81)
RDW: 12.5 % (ref 11.5–15.5)
WBC Count: 2.4 10*3/uL — ABNORMAL LOW (ref 4.0–10.5)
nRBC: 0 % (ref 0.0–0.2)

## 2019-03-13 MED ORDER — PROCHLORPERAZINE MALEATE 10 MG PO TABS: 10.0000 mg | ORAL_TABLET | Freq: Once | ORAL | Status: DC

## 2019-03-13 MED ORDER — BORTEZOMIB CHEMO SQ INJECTION 3.5 MG (2.5MG/ML)
1.3000 mg/m2 | Freq: Once | INTRAMUSCULAR | Status: AC
  Administered 2019-03-13: 2.5 mg via SUBCUTANEOUS
  Filled 2019-03-13: qty 1

## 2019-03-13 NOTE — Patient Instructions (Signed)
[facility] Discharge Instructions for Patients Receiving Chemotherapy  Today you received the following chemotherapy agents Velcade To help prevent nausea and vomiting after your treatment, we encourage you to take your nausea medication as prescribed.   If you develop nausea and vomiting that is not controlled by your nausea medication, call the clinic.   BELOW ARE SYMPTOMS THAT SHOULD BE REPORTED IMMEDIATELY:  *FEVER GREATER THAN 100.5 F  *CHILLS WITH OR WITHOUT FEVER  NAUSEA AND VOMITING THAT IS NOT CONTROLLED WITH YOUR NAUSEA MEDICATION  *UNUSUAL SHORTNESS OF BREATH  *UNUSUAL BRUISING OR BLEEDING  TENDERNESS IN MOUTH AND THROAT WITH OR WITHOUT PRESENCE OF ULCERS  *URINARY PROBLEMS  *BOWEL PROBLEMS  UNUSUAL RASH Items with * indicate a potential emergency and should be followed up as soon as possible.  Feel free to call the clinic should you have any questions or concerns. The clinic phone number is [phone].  Please show the CHEMO ALERT CARD at check-in to the Emergency Department and triage nurse.   

## 2019-03-13 NOTE — Progress Notes (Signed)
Danny Juarez  Patient Care Team: Patient, No Pcp Per as PCP - General (General Practice) Danny Poche, RN as Oncology Nurse Navigator [name], MD as Medical Oncologist (Hematology)  HEME/ONC OVERVIEW: 1. 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 and DS  -10/2018: MRI of the right shoulder for arm pain showed innumerable enhancing lesions throughout the right humerus, ribs and right scapula; no fractures -11/2018: baseline labs  Hgb 10, Ca 12.2, Cr 2.89, albumin 2.9  M-spike 3.7 g/dL, free lambda 7.8, monoclonal IgG lambda on IFE, quant IgG 5602, LDH 180, B2M 6.8  PET showed innumerable lytic lesions involving the axial and proximal appendicular skeleton and calvarium; no plasmacytoma   BM bx: normocellular marrow with plasma cell neoplasm (~64% of all cells); myeloma FISH showed trisomy 11 and gain of ATM gene (standard risk)  2. Myeloma-associated bone disease -S/p Xgeva x 1 for Ca 12.2; Zometa (insurance-preferred) on hold, pending dental evaluation  TREATMENT REGIMEN:  01/09/2019 - present: q21day RVd; Revlimid dose reduced to 3m daily due to fluctuating renal function  -C1 (2/20), C2 (3/12), C3 (4/2), C4 (4/23)  ASSESSMENT & PLAN:   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 and DS  -S/p 3 cycles of RVd -M-spike decreased from 3.7 to 1.1 g/dL after 3 cycles of RVd, partial response -Labs adequate today, proceed with C4D1 on 03/13/2019  -[facility] [place] -I discussed with the patient that [place] [facility] [place] [facility] [place] [place] [facility], we will need to be mindful of time so that we can get him to transplant on a timely basis  -VZV prophylaxis: acyclovir -PRN anti-emetics: Zofran, Compazine, and Ativan  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4k with ANC 200, stable -Patient denies any symptoms of infection -We will monitor for now; no indication for dose adjustment  Chemotherapy-associated anemia -Secondary to chemotherapy -Hgb 11.9, stable -Patient denies any symptom of bleeding -We will monitor for now; no indication for dose adjustment  Chemotherapy-associated thrombocytopenia -Secondary to chemotherapy -Plts 137k, stable -Patient denies any symptoms of bleeding or excess bruising, such as epistaxis, hematochezia, melena, or hematuria -We will monitor for now; no indication for dose adjustment  No orders of the defined types were placed in this encounter.  All questions were answered. The patient knows to call the clinic with any problems, questions or concerns. No barriers to learning was detected.  Return in 3 weeks at the start of C5D1 of RVd for labs and clinic appt.   YTish Men MD 03/13/2019 11:40 AM  CHIEF COMPLAINT: "I am doing fine"  INTERVAL HISTORY: Mr. HSpeirreturns to clinic for follow-up of multiple myeloma on RVD.  He reports that he has been doing well since last visit, and denies any new complaint today.  He  had dental crown placement about a week ago, and is scheduled for additional dental work, including repair of a broken tooth, by his dentist Dr. Alinda Money, sometime in the beginning of May.  He still has intermittent left  shoulder soreness, which is unchanged, and has not had to take any medication.  He denies any other new symptoms.  SUMMARY OF ONCOLOGIC HISTORY:   Multiple myeloma ([place])   11/11/2018 Imaging    MRI right shoulder for arm pain showed innumerable enhancing lesions throughout the right humerus, ribs and right scapula; no fractures    11/29/2018 Miscellaneous    Baseline labs -Hgb 10, Ca 12.2, Cr 2.89, albumin 2.9 -M-spike 3.7 g/dL, free lambda 7.8, monoclonal IgG lambda on IFE, quant IgG 5602, LDH 180, B2M 6.8    01/09/2019 -  Chemotherapy    The patient had bortezomib SQ (VELCADE) chemo injection 2.5 mg, 1.3 mg/m2 = 2.5 mg, Subcutaneous,  Once, 4 of 7 cycles Administration: 2.5 mg (01/09/2019), 2.5 mg (01/16/2019), 2.5 mg (01/23/2019), 2.5 mg (01/30/2019), 2.5 mg (02/06/2019), 2.5 mg (02/13/2019), 2.5 mg (02/20/2019), 2.5 mg (02/27/2019), 2.5 mg (03/06/2019)  for chemotherapy treatment.      REVIEW OF SYSTEMS:   Constitutional: ( - ) fevers, ( - )  chills , ( - ) night sweats Eyes: ( - ) blurriness of vision, ( - ) double vision, ( - ) watery eyes Ears, nose, mouth, throat, and face: ( - ) mucositis, ( - ) sore throat Respiratory: ( - ) cough, ( - ) dyspnea, ( - ) wheezes Cardiovascular: ( - ) palpitation, ( - ) chest discomfort, ( - ) lower extremity swelling Gastrointestinal:  ( - ) nausea, ( - ) heartburn, ( - ) change in bowel habits Skin: ( - ) abnormal skin rashes Lymphatics: ( - ) new lymphadenopathy, ( - ) easy bruising Neurological: ( - ) numbness, ( - ) tingling, ( - ) new weaknesses Behavioral/Psych: ( - ) mood change, ( - ) new changes  All other systems were reviewed with the patient and are negative.  I have reviewed the past medical history, past surgical history, social history and family history with the patient and they are unchanged from previous Juarez.  ALLERGIES:  has No Known Allergies.  MEDICATIONS:  Current Outpatient Medications  Medication Sig Dispense Refill  .  acyclovir (ZOVIRAX) 400 MG tablet Take 1 tablet (400 mg total) by mouth 2 (two) times daily. 60 tablet 3  . aspirin EC 81 MG tablet Take 81 mg by mouth daily.    . calcium acetate (PHOSLO) 667 MG capsule Take 667 mg by mouth 3 (three) times daily.    . chlorhexidine (PERIDEX) 0.12 % solution     . dexamethasone (DECADRON) 4 MG tablet Take 10 tablets (40 mg) on days 1, 8, and 15 of chemo. Repeat every 21 days. 30 tablet 3  . ferrous sulfate 325 (65 FE) MG EC tablet Take 325 mg by mouth 3 (three) times daily with meals.    . lidocaine-prilocaine (EMLA) cream Apply to affected area once 30 g 3  . LORazepam (ATIVAN) 0.5 MG tablet Take 1 tablet (0.5 mg total) by mouth every 6 (six) hours as needed (Nausea or vomiting). 30 tablet 0  . Multiple Vitamin (MULTIVITAMIN WITH MINERALS) TABS Take 1 tablet by mouth daily.    . ondansetron (ZOFRAN) 8 MG tablet Take 1 tablet (8 mg total) by mouth 2 (two) times daily as needed (Nausea or vomiting). 30 tablet 1  .  prochlorperazine (COMPAZINE) 10 MG tablet Take 1 tablet (10 mg total) by mouth every 6 (six) hours as needed (Nausea or vomiting). 30 tablet 1  . REVLIMID 20 MG capsule TAKE 1 CAPSULE BY MOUTH  DAILY. SWALLOW WHOLE DO NOT BREAK, CRUSH, OR CHEW  CAPSULE. 14 capsule 0   No current facility-administered medications for this visit.    Facility-Administered Medications Ordered in Other Visits  Medication Dose Route Frequency Provider Last Rate Last Dose  . bortezomib SQ (VELCADE) chemo injection 2.5 mg  1.3 mg/m2 (Treatment Plan Recorded) Subcutaneous Once [name], MD      . prochlorperazine (COMPAZINE) tablet 10 mg  10 mg Oral Once [name], MD        PHYSICAL EXAMINATION: ECOG PERFORMANCE STATUS: 1 - Symptomatic but completely ambulatory  Today's Vitals   03/13/19 1117  BP: (!) 96/56  Pulse: (!) 56  Resp: 18  Temp: 98.5 F (36.9 C)  TempSrc: Oral  SpO2: 99%  Weight: 165 lb (74.8 kg)  Height: '5\' 9"'  (1.753 m)  PainSc: 0-No pain   Body mass  index is 24.37 kg/m.  Filed Weights   03/13/19 1117  Weight: 165 lb (74.8 kg)    GENERAL: alert, no distress and comfortable SKIN: skin color, texture, turgor are normal, no rashes or significant lesions EYES: conjunctiva are pink and non-injected, sclera clear OROPHARYNX: no exudate, no erythema; lips, buccal mucosa, and tongue normal  NECK: supple, non-tender LUNGS: clear to auscultation with normal breathing effort HEART: regular rate & rhythm and no murmurs and no lower extremity edema ABDOMEN: soft, non-tender, non-distended, normal bowel sounds Musculoskeletal: no cyanosis of digits and no clubbing  PSYCH: alert & oriented x 3, fluent speech NEURO: no focal motor/sensory deficits  LABORATORY DATA:  I have reviewed the data as listed    Component Value Date/Time   NA 138 03/13/2019 1041   K 4.1 03/13/2019 1041   CL 105 03/13/2019 1041   CO2 28 03/13/2019 1041   GLUCOSE 111 (H) 03/13/2019 1041   BUN 24 (H) 03/13/2019 1041   CREATININE 0.84 03/13/2019 1041   CALCIUM 9.2 03/13/2019 1041   PROT 6.7 03/13/2019 1041   ALBUMIN 4.0 03/13/2019 1041   AST 22 03/13/2019 1041   ALT 21 03/13/2019 1041   ALKPHOS 75 03/13/2019 1041   BILITOT 0.5 03/13/2019 1041   GFRNONAA >60 03/13/2019 1041   GFRAA >60 03/13/2019 1041    No results found for: SPEP, UPEP  Lab Results  Component Value Date   WBC 2.4 (L) 03/13/2019   NEUTROABS 2.0 03/13/2019   HGB 11.9 (L) 03/13/2019   HCT 34.8 (L) 03/13/2019   MCV 97.8 03/13/2019   PLT 137 (L) 03/13/2019      Chemistry      Component Value Date/Time   NA 138 03/13/2019 1041   K 4.1 03/13/2019 1041   CL 105 03/13/2019 1041   CO2 28 03/13/2019 1041   BUN 24 (H) 03/13/2019 1041   CREATININE 0.84 03/13/2019 1041      Component Value Date/Time   CALCIUM 9.2 03/13/2019 1041   ALKPHOS 75 03/13/2019 1041   AST 22 03/13/2019 1041   ALT 21 03/13/2019 1041   BILITOT 0.5 03/13/2019 1041

## 2019-03-13 NOTE — Telephone Encounter (Signed)
No change in appts per 4/23 los

## 2019-03-19 ENCOUNTER — Encounter (HOSPITAL_COMMUNITY): Payer: Self-pay

## 2019-03-19 ENCOUNTER — Other Ambulatory Visit: Payer: Self-pay

## 2019-03-19 ENCOUNTER — Emergency Department (HOSPITAL_COMMUNITY)
Admission: EM | Admit: 2019-03-19 | Discharge: 2019-03-19 | Disposition: A | Discharge status: Home / Self Care | Payer: 59 | Attending: Emergency Medicine | Admitting: Emergency Medicine

## 2019-03-19 ENCOUNTER — Emergency Department (HOSPITAL_COMMUNITY): Payer: 59

## 2019-03-19 DIAGNOSIS — Z79899 Other long term (current) drug therapy: Secondary | ICD-10-CM | POA: Diagnosis not present

## 2019-03-19 DIAGNOSIS — C9000 Multiple myeloma not having achieved remission: Secondary | ICD-10-CM | POA: Diagnosis not present

## 2019-03-19 DIAGNOSIS — Z7982 Long term (current) use of aspirin: Secondary | ICD-10-CM | POA: Insufficient documentation

## 2019-03-19 DIAGNOSIS — M84522A Pathological fracture in neoplastic disease, left humerus, initial encounter for fracture: Secondary | ICD-10-CM | POA: Insufficient documentation

## 2019-03-19 DIAGNOSIS — M79602 Pain in left arm: Secondary | ICD-10-CM | POA: Diagnosis present

## 2019-03-19 MED ORDER — HYDROCODONE-ACETAMINOPHEN 5-325 MG PO TABS: 1.0000 | ORAL_TABLET | ORAL | 0 refills | Status: DC | PRN

## 2019-03-19 MED ORDER — FENTANYL CITRATE (PF) 100 MCG/2ML IJ SOLN
50.0000 ug | Freq: Once | INTRAMUSCULAR | Status: AC
  Administered 2019-03-19: 11:00:00 50 ug via INTRAMUSCULAR
  Filled 2019-03-19: qty 2

## 2019-03-19 NOTE — ED Triage Notes (Signed)
Pt states he was lifting weights this morning when he began having left upper arm pain. Pt states that "I either broke it or tore all the muscles". Pt has hx of multiple myeloma.

## 2019-03-19 NOTE — ED Notes (Signed)
ED Provider at bedside. 

## 2019-03-19 NOTE — ED Provider Notes (Signed)
[facility] DEPT Provider Note   CSN: [id number] Arrival date & time: 03/19/19  0932    History   Chief Complaint Chief Complaint  Patient presents with  . Arm Pain    HPI Danny Juarez is a 63 y.o. male.     HPI Patient has a history of multiple myeloma and has pain in his left humerus.  Began acutely while he was lifting some weights today.  States he felt a pop or crack.  Thinks he either broke or tore all the muscles.  States he feels more as if it is in the middle part of the humerus.  No other injury.  He is on chemotherapy.  No numbness or weakness. Past Medical History:  Diagnosis Date  . PE (pulmonary embolism) 1996    Patient Active Problem List   Diagnosis Date Noted  . Chemotherapy-induced thrombocytopenia 01/30/2019  . Metastatic multiple myeloma to bone ([place]) 12/20/2018  . Hypocalcemia 12/20/2018  . Goals of care, counseling/discussion 12/20/2018  . Neuropathy 12/06/2018  . Hyponatremia 12/06/2018  . Multiple myeloma ([place]) 11/29/2018  . Anemia due to antineoplastic chemotherapy 11/29/2018  . Leukopenia due to antineoplastic chemotherapy ([place]) 11/29/2018    Past Surgical History:  Procedure Laterality Date  . KNEE ARTHROSCOPY          Home Medications    Prior to Admission medications   Medication Sig Start Date End Date Taking? Authorizing Provider  acyclovir (ZOVIRAX) 400 MG tablet Take 1 tablet (400 mg total) by mouth 2 (two) times daily. 01/30/19   [name], MD  aspirin EC 81 MG tablet Take 81 mg by mouth daily.    [provider]  calcium acetate (PHOSLO) 667 MG capsule Take 667 mg by mouth 3 (three) times daily.    [provider]  chlorhexidine (PERIDEX) 0.12 % solution  02/06/19   [provider]  dexamethasone (DECADRON) 4 MG tablet Take 10 tablets (40 mg) on days 1, 8, and 15 of chemo. Repeat every 21 days. 12/20/18   [name], MD  ferrous sulfate 325 (65 FE) MG EC tablet Take  325 mg by mouth 3 (three) times daily with meals.    [provider]  HYDROcodone-acetaminophen (NORCO/VICODIN) 5-325 MG tablet Take 1-2 tablets by mouth every 4 (four) hours as needed. 03/19/19   Davonna Belling, MD  lidocaine-prilocaine (EMLA) cream Apply to affected area once 12/20/18   [name], MD  LORazepam (ATIVAN) 0.5 MG tablet Take 1 tablet (0.5 mg total) by mouth every 6 (six) hours as needed (Nausea or vomiting). 12/20/18   [name], MD  Multiple Vitamin (MULTIVITAMIN WITH MINERALS) TABS Take 1 tablet by mouth daily.    [provider]  ondansetron (ZOFRAN) 8 MG tablet Take 1 tablet (8 mg total) by mouth 2 (two) times daily as needed (Nausea or vomiting). 12/20/18   [name], MD  prochlorperazine (COMPAZINE) 10 MG tablet Take 1 tablet (10 mg total) by mouth every 6 (six) hours as needed (Nausea or vomiting). 12/20/18   [name], MD  REVLIMID 20 MG capsule TAKE 1 CAPSULE BY MOUTH  DAILY. SWALLOW WHOLE DO NOT BREAK, CRUSH, OR CHEW  CAPSULE. 03/10/19   Volanda Napoleon, MD    Family History No family history on file.  Social History Social History   Tobacco Use  . Smoking status: Never Smoker  . Smokeless tobacco: Never Used  Substance Use Topics  . Alcohol use: Yes    Comment: occassional  .  Drug use: No     Allergies   Patient has no known allergies.   Review of Systems Review of Systems  Constitutional: Negative for appetite change.  Respiratory: Negative for shortness of breath.   Cardiovascular: Negative for chest pain.  Gastrointestinal: Negative for abdominal pain.  Musculoskeletal:       Left upper extremity pain.  Skin: Negative for wound.  Neurological: Negative for weakness and numbness.     Physical Exam Updated Vital Signs BP 118/75   Pulse 62   Temp (!) 97.4 F (36.3 C) (Oral)   Resp 16   Ht '5\' 9"'  (1.753 m)   Wt 74 kg   SpO2 96%   BMI 24.09 kg/m   Physical Exam Vitals signs and nursing note reviewed.  Constitutional:       Appearance: Normal appearance.  Cardiovascular:     Rate and Rhythm: Regular rhythm.  Pulmonary:     Effort: Pulmonary effort is normal.  Abdominal:     Tenderness: There is no abdominal tenderness.  Musculoskeletal:     Comments: Left forearm is held in the right hand.  Arm held against body.  Tenderness over humerus.  There is some irregularity of the bicep which patient states is chronic.  Tenderness over mid to distal humerus.  Less tender proximally.  Radial pulse strong.  Skin:    General: Skin is warm.  Neurological:     General: No focal deficit present.      ED Treatments / Results  Labs (all labs ordered are listed, but only abnormal results are displayed) Labs Reviewed - No data to display  EKG None  Radiology Dg Humerus Left  Result Date: 03/19/2019 CLINICAL DATA:  Left upper arm pain.  History of multiple myeloma. EXAM: LEFT HUMERUS - 2+ VIEW COMPARISON:  None. FINDINGS: Mildly displaced pathologic fracture seen involving the midshaft of the left humerus. Innumerable rounded lucencies are seen throughout the left humerus and other visualized skeletal structures consistent with history of multiple myeloma. IMPRESSION: Mildly displaced pathologic fracture involving midshaft of left humerus. Findings consistent with multiple myeloma. Electronically Signed   By: Marijo Conception M.D.   On: 03/19/2019 10:19    Procedures Procedures (including critical care time)  Medications Ordered in ED Medications  fentaNYL (SUBLIMAZE) injection 50 mcg (50 mcg Intramuscular Given 03/19/19 1104)     Initial Impression / Assessment and Plan / ED Course  I have reviewed the triage vital signs and the nursing notes.  Pertinent labs & imaging results that were available during my care of the patient were reviewed by me and considered in my medical decision making (see chart for details).        Patient with pathologic fracture of left humerus.  Discussed with Dr. Lyla Glassing.   Coaptation splint and follow-up with Dr. Stann Mainland in 5 to 7 days.  Final Clinical Impressions(s) / ED Diagnoses   Final diagnoses:  Pathological fracture of left humerus due to neoplastic disease, initial encounter    ED Discharge Orders         Ordered    HYDROcodone-acetaminophen (NORCO/VICODIN) 5-325 MG tablet  Every 4 hours PRN     03/19/19 1138           Davonna Belling, MD 03/19/19 1425

## 2019-03-19 NOTE — Discharge Instructions (Signed)
Watch for increased pain or swelling in the hand.  Watch for numbness or weakness in the hand.  Follow-up with the orthopedic surgeon in 5 to 7 days.

## 2019-03-20 ENCOUNTER — Other Ambulatory Visit: Payer: Self-pay | Admitting: Hematology

## 2019-03-20 ENCOUNTER — Inpatient Hospital Stay: Payer: 59

## 2019-03-20 VITALS — BP 117/70 | HR 76 | Temp 97.5°F | Resp 18

## 2019-03-20 DIAGNOSIS — C9000 Multiple myeloma not having achieved remission: Secondary | ICD-10-CM

## 2019-03-20 DIAGNOSIS — Z5112 Encounter for antineoplastic immunotherapy: Secondary | ICD-10-CM | POA: Diagnosis not present

## 2019-03-20 LAB — CBC WITH DIFFERENTIAL (CANCER CENTER ONLY)
Abs Immature Granulocytes: 0.02 10*3/uL (ref 0.00–0.07)
Basophils Absolute: 0 10*3/uL (ref 0.0–0.1)
Basophils Relative: 0 %
Eosinophils Absolute: 0 10*3/uL (ref 0.0–0.5)
Eosinophils Relative: 0 %
HCT: 34.7 % — ABNORMAL LOW (ref 39.0–52.0)
Hemoglobin: 12 g/dL — ABNORMAL LOW (ref 13.0–17.0)
Immature Granulocytes: 1 %
Lymphocytes Relative: 8 %
Lymphs Abs: 0.3 10*3/uL — ABNORMAL LOW (ref 0.7–4.0)
MCH: 33.2 pg (ref 26.0–34.0)
MCHC: 34.6 g/dL (ref 30.0–36.0)
MCV: 96.1 fL (ref 80.0–100.0)
Monocytes Absolute: 0.2 10*3/uL (ref 0.1–1.0)
Monocytes Relative: 4 %
Neutro Abs: 3.1 10*3/uL (ref 1.7–7.7)
Neutrophils Relative %: 87 %
Platelet Count: 188 10*3/uL (ref 150–400)
RBC: 3.61 MIL/uL — ABNORMAL LOW (ref 4.22–5.81)
RDW: 12.8 % (ref 11.5–15.5)
WBC Count: 3.5 10*3/uL — ABNORMAL LOW (ref 4.0–10.5)
nRBC: 0 % (ref 0.0–0.2)

## 2019-03-20 LAB — CMP (CANCER CENTER ONLY)
ALT: 20 U/L (ref 0–44)
AST: 21 U/L (ref 15–41)
Albumin: 3.9 g/dL (ref 3.5–5.0)
Alkaline Phosphatase: 67 U/L (ref 38–126)
Anion gap: 11 (ref 5–15)
BUN: 21 mg/dL (ref 8–23)
CO2: 25 mmol/L (ref 22–32)
Calcium: 9.5 mg/dL (ref 8.9–10.3)
Chloride: 101 mmol/L (ref 98–111)
Creatinine: 0.89 mg/dL (ref 0.61–1.24)
GFR, Est AFR Am: >60 mL/min (ref >60)
GFR, Estimated: >60 mL/min (ref >60)
Glucose, Bld: 127 mg/dL — ABNORMAL HIGH (ref 70–99)
Potassium: 4.3 mmol/L (ref 3.5–5.1)
Sodium: 137 mmol/L (ref 135–145)
Total Bilirubin: 0.7 mg/dL (ref 0.3–1.2)
Total Protein: 6.9 g/dL (ref 6.5–8.1)

## 2019-03-20 MED ORDER — BORTEZOMIB CHEMO SQ INJECTION 3.5 MG (2.5MG/ML)
1.3000 mg/m2 | Freq: Once | INTRAMUSCULAR | Status: AC
  Administered 2019-03-20: 2.5 mg via SUBCUTANEOUS
  Filled 2019-03-20: qty 1

## 2019-03-20 MED ORDER — PROCHLORPERAZINE MALEATE 10 MG PO TABS: 10.0000 mg | ORAL_TABLET | Freq: Once | ORAL | Status: DC

## 2019-03-20 MED ORDER — MORPHINE SULFATE 15 MG PO TABS: 15.0000 mg | ORAL_TABLET | ORAL | 0 refills | Status: DC | PRN

## 2019-03-20 NOTE — Patient Instructions (Signed)

## 2019-03-25 DIAGNOSIS — S42302A Unspecified fracture of shaft of humerus, left arm, initial encounter for closed fracture: Secondary | ICD-10-CM | POA: Insufficient documentation

## 2019-03-27 ENCOUNTER — Inpatient Hospital Stay: Payer: 59 | Attending: Hematology

## 2019-03-27 ENCOUNTER — Inpatient Hospital Stay: Payer: 59

## 2019-03-27 ENCOUNTER — Other Ambulatory Visit: Payer: Self-pay

## 2019-03-27 VITALS — BP 106/61 | HR 58 | Temp 97.6°F | Resp 19 | Wt 161.8 lb

## 2019-03-27 DIAGNOSIS — D701 Agranulocytosis secondary to cancer chemotherapy: Secondary | ICD-10-CM | POA: Diagnosis not present

## 2019-03-27 DIAGNOSIS — M84422A Pathological fracture, left humerus, initial encounter for fracture: Secondary | ICD-10-CM | POA: Insufficient documentation

## 2019-03-27 DIAGNOSIS — D6959 Other secondary thrombocytopenia: Secondary | ICD-10-CM | POA: Insufficient documentation

## 2019-03-27 DIAGNOSIS — Z5112 Encounter for antineoplastic immunotherapy: Secondary | ICD-10-CM | POA: Insufficient documentation

## 2019-03-27 DIAGNOSIS — D6481 Anemia due to antineoplastic chemotherapy: Secondary | ICD-10-CM | POA: Diagnosis not present

## 2019-03-27 DIAGNOSIS — T451X5A Adverse effect of antineoplastic and immunosuppressive drugs, initial encounter: Secondary | ICD-10-CM | POA: Diagnosis not present

## 2019-03-27 DIAGNOSIS — C9000 Multiple myeloma not having achieved remission: Secondary | ICD-10-CM

## 2019-03-27 LAB — CMP (CANCER CENTER ONLY)
ALT: 19 U/L (ref 0–44)
AST: 19 U/L (ref 15–41)
Albumin: 3.9 g/dL (ref 3.5–5.0)
Alkaline Phosphatase: 72 U/L (ref 38–126)
Anion gap: 7 (ref 5–15)
BUN: 20 mg/dL (ref 8–23)
CO2: 30 mmol/L (ref 22–32)
Calcium: 9.6 mg/dL (ref 8.9–10.3)
Chloride: 100 mmol/L (ref 98–111)
Creatinine: 0.88 mg/dL (ref 0.61–1.24)
GFR, Est AFR Am: >60 mL/min (ref >60)
GFR, Estimated: >60 mL/min (ref >60)
Glucose, Bld: 86 mg/dL (ref 70–99)
Potassium: 4.1 mmol/L (ref 3.5–5.1)
Sodium: 137 mmol/L (ref 135–145)
Total Bilirubin: 0.7 mg/dL (ref 0.3–1.2)
Total Protein: 6.8 g/dL (ref 6.5–8.1)

## 2019-03-27 LAB — CBC WITH DIFFERENTIAL (CANCER CENTER ONLY)
Abs Immature Granulocytes: 0.01 10*3/uL (ref 0.00–0.07)
Basophils Absolute: 0 10*3/uL (ref 0.0–0.1)
Basophils Relative: 1 %
Eosinophils Absolute: 0 10*3/uL (ref 0.0–0.5)
Eosinophils Relative: 1 %
HCT: 35.4 % — ABNORMAL LOW (ref 39.0–52.0)
Hemoglobin: 12 g/dL — ABNORMAL LOW (ref 13.0–17.0)
Immature Granulocytes: 0 %
Lymphocytes Relative: 7 %
Lymphs Abs: 0.2 10*3/uL — ABNORMAL LOW (ref 0.7–4.0)
MCH: 32.6 pg (ref 26.0–34.0)
MCHC: 33.9 g/dL (ref 30.0–36.0)
MCV: 96.2 fL (ref 80.0–100.0)
Monocytes Absolute: 0.3 10*3/uL (ref 0.1–1.0)
Monocytes Relative: 8 %
Neutro Abs: 2.8 10*3/uL (ref 1.7–7.7)
Neutrophils Relative %: 83 %
Platelet Count: 184 10*3/uL (ref 150–400)
RBC: 3.68 MIL/uL — ABNORMAL LOW (ref 4.22–5.81)
RDW: 12.4 % (ref 11.5–15.5)
WBC Count: 3.4 10*3/uL — ABNORMAL LOW (ref 4.0–10.5)
nRBC: 0 % (ref 0.0–0.2)

## 2019-03-27 MED ORDER — BORTEZOMIB CHEMO SQ INJECTION 3.5 MG (2.5MG/ML)
1.3000 mg/m2 | Freq: Once | INTRAMUSCULAR | Status: AC
  Administered 2019-03-27: 2.5 mg via SUBCUTANEOUS
  Filled 2019-03-27: qty 1

## 2019-03-27 NOTE — Progress Notes (Signed)
t took compazine at home this morning

## 2019-03-27 NOTE — Patient Instructions (Signed)
[facility] Discharge Instructions for Patients Receiving Chemotherapy  Today you received the following chemotherapy agents Velcade To help prevent nausea and vomiting after your treatment, we encourage you to take your nausea medication as prescribed.   If you develop nausea and vomiting that is not controlled by your nausea medication, call the clinic.   BELOW ARE SYMPTOMS THAT SHOULD BE REPORTED IMMEDIATELY:  *FEVER GREATER THAN 100.5 F  *CHILLS WITH OR WITHOUT FEVER  NAUSEA AND VOMITING THAT IS NOT CONTROLLED WITH YOUR NAUSEA MEDICATION  *UNUSUAL SHORTNESS OF BREATH  *UNUSUAL BRUISING OR BLEEDING  TENDERNESS IN MOUTH AND THROAT WITH OR WITHOUT PRESENCE OF ULCERS  *URINARY PROBLEMS  *BOWEL PROBLEMS  UNUSUAL RASH Items with * indicate a potential emergency and should be followed up as soon as possible.  Feel free to call the clinic should you have any questions or concerns. The clinic phone number is [phone].  Please show the CHEMO ALERT CARD at check-in to the Emergency Department and triage nurse.   

## 2019-04-03 ENCOUNTER — Inpatient Hospital Stay (HOSPITAL_BASED_OUTPATIENT_CLINIC_OR_DEPARTMENT_OTHER): Payer: 59 | Admitting: Hematology

## 2019-04-03 ENCOUNTER — Other Ambulatory Visit: Payer: Self-pay

## 2019-04-03 ENCOUNTER — Inpatient Hospital Stay: Payer: 59

## 2019-04-03 ENCOUNTER — Encounter: Payer: Self-pay | Admitting: Hematology

## 2019-04-03 VITALS — BP 98/61 | HR 57 | Resp 17

## 2019-04-03 DIAGNOSIS — C9000 Multiple myeloma not having achieved remission: Secondary | ICD-10-CM

## 2019-04-03 DIAGNOSIS — D6959 Other secondary thrombocytopenia: Secondary | ICD-10-CM

## 2019-04-03 DIAGNOSIS — Z5112 Encounter for antineoplastic immunotherapy: Secondary | ICD-10-CM | POA: Diagnosis not present

## 2019-04-03 DIAGNOSIS — D6481 Anemia due to antineoplastic chemotherapy: Secondary | ICD-10-CM

## 2019-04-03 DIAGNOSIS — D701 Agranulocytosis secondary to cancer chemotherapy: Secondary | ICD-10-CM

## 2019-04-03 DIAGNOSIS — T451X5A Adverse effect of antineoplastic and immunosuppressive drugs, initial encounter: Secondary | ICD-10-CM

## 2019-04-03 DIAGNOSIS — M84422A Pathological fracture, left humerus, initial encounter for fracture: Secondary | ICD-10-CM | POA: Diagnosis not present

## 2019-04-03 LAB — CMP (CANCER CENTER ONLY)
ALT: 21 U/L (ref 0–44)
AST: 22 U/L (ref 15–41)
Albumin: 3.8 g/dL (ref 3.5–5.0)
Alkaline Phosphatase: 89 U/L (ref 38–126)
Anion gap: 7 (ref 5–15)
BUN: 28 mg/dL — ABNORMAL HIGH (ref 8–23)
CO2: 28 mmol/L (ref 22–32)
Calcium: 9.4 mg/dL (ref 8.9–10.3)
Chloride: 102 mmol/L (ref 98–111)
Creatinine: 0.84 mg/dL (ref 0.61–1.24)
GFR, Est AFR Am: >60 mL/min (ref >60)
GFR, Estimated: >60 mL/min (ref >60)
Glucose, Bld: 111 mg/dL — ABNORMAL HIGH (ref 70–99)
Potassium: 4 mmol/L (ref 3.5–5.1)
Sodium: 137 mmol/L (ref 135–145)
Total Bilirubin: 0.4 mg/dL (ref 0.3–1.2)
Total Protein: 6.8 g/dL (ref 6.5–8.1)

## 2019-04-03 LAB — CBC WITH DIFFERENTIAL (CANCER CENTER ONLY)
Abs Immature Granulocytes: 0.01 10*3/uL (ref 0.00–0.07)
Basophils Absolute: 0 10*3/uL (ref 0.0–0.1)
Basophils Relative: 1 %
Eosinophils Absolute: 0 10*3/uL (ref 0.0–0.5)
Eosinophils Relative: 0 %
HCT: 34.6 % — ABNORMAL LOW (ref 39.0–52.0)
Hemoglobin: 11.8 g/dL — ABNORMAL LOW (ref 13.0–17.0)
Immature Granulocytes: 0 %
Lymphocytes Relative: 10 %
Lymphs Abs: 0.3 10*3/uL — ABNORMAL LOW (ref 0.7–4.0)
MCH: 31.7 pg (ref 26.0–34.0)
MCHC: 34.1 g/dL (ref 30.0–36.0)
MCV: 93 fL (ref 80.0–100.0)
Monocytes Absolute: 0.1 10*3/uL (ref 0.1–1.0)
Monocytes Relative: 5 %
Neutro Abs: 2.4 10*3/uL (ref 1.7–7.7)
Neutrophils Relative %: 84 %
Platelet Count: 132 10*3/uL — ABNORMAL LOW (ref 150–400)
RBC: 3.72 MIL/uL — ABNORMAL LOW (ref 4.22–5.81)
RDW: 12.4 % (ref 11.5–15.5)
WBC Count: 2.9 10*3/uL — ABNORMAL LOW (ref 4.0–10.5)
nRBC: 0 % (ref 0.0–0.2)

## 2019-04-03 MED ORDER — PROCHLORPERAZINE MALEATE 10 MG PO TABS: 10.0000 mg | ORAL_TABLET | Freq: Once | ORAL | Status: DC

## 2019-04-03 MED ORDER — ZOLEDRONIC ACID 4 MG/100ML IV SOLN
4.0000 mg | Freq: Once | INTRAVENOUS | Status: AC
  Administered 2019-04-03: 4 mg via INTRAVENOUS
  Filled 2019-04-03: qty 100

## 2019-04-03 MED ORDER — SODIUM CHLORIDE 0.9 % IV SOLN
Freq: Once | INTRAVENOUS | Status: AC
  Administered 2019-04-03: 12:00:00 via INTRAVENOUS
  Filled 2019-04-03: qty 250

## 2019-04-03 MED ORDER — BORTEZOMIB CHEMO SQ INJECTION 3.5 MG (2.5MG/ML)
1.3000 mg/m2 | Freq: Once | INTRAMUSCULAR | Status: AC
  Administered 2019-04-03: 2.5 mg via SUBCUTANEOUS
  Filled 2019-04-03: qty 1

## 2019-04-03 NOTE — Patient Instructions (Signed)
[facility] Discharge Instructions for Patients Receiving Chemotherapy  Today you received the following chemotherapy agents Velcade To help prevent nausea and vomiting after your treatment, we encourage you to take your nausea medication as prescribed.  If you develop nausea and vomiting that is not controlled by your nausea medication, call the clinic.   BELOW ARE SYMPTOMS THAT SHOULD BE REPORTED IMMEDIATELY:  *FEVER GREATER THAN 100.5 F  *CHILLS WITH OR WITHOUT FEVER  NAUSEA AND VOMITING THAT IS NOT CONTROLLED WITH YOUR NAUSEA MEDICATION  *UNUSUAL SHORTNESS OF BREATH  *UNUSUAL BRUISING OR BLEEDING  TENDERNESS IN MOUTH AND THROAT WITH OR WITHOUT PRESENCE OF ULCERS  *URINARY PROBLEMS  *BOWEL PROBLEMS  UNUSUAL RASH Items with * indicate a potential emergency and should be followed up as soon as possible.  Feel free to call the clinic should you have any questions or concerns. The clinic phone number is [phone].  Please show the [facility] at check-in to the Emergency Department and triage nurse.   Zoledronic Acid injection (Hypercalcemia, Oncology) (Zometa) What is this medicine? ZOLEDRONIC ACID (ZOE le dron ik AS id) lowers the amount of calcium loss from bone. It is used to treat too much calcium in your blood from cancer. It is also used to prevent complications of cancer that has spread to the bone. This medicine may be used for other purposes; ask your health care provider or pharmacist if you have questions. COMMON BRAND NAME(S): Zometa What should I tell my health care provider before I take this medicine? They need to know if you have any of these conditions: -aspirin-sensitive asthma -cancer, especially if you are receiving medicines used to treat cancer -dental disease or wear dentures -infection -kidney disease -receiving corticosteroids like dexamethasone or prednisone -an unusual or allergic reaction to zoledronic acid, other  medicines, foods, dyes, or preservatives -pregnant or trying to get pregnant -breast-feeding How should I use this medicine? This medicine is for infusion into a vein. It is given by a health care professional in a hospital or clinic setting. Talk to your pediatrician regarding the use of this medicine in children. Special care may be needed. Overdosage: If you think you have taken too much of this medicine contact a poison control center or emergency room at once. NOTE: This medicine is only for you. Do not share this medicine with others. What if I miss a dose? It is important not to miss your dose. Call your doctor or health care professional if you are unable to keep an appointment. What may interact with this medicine? -certain antibiotics given by injection -NSAIDs, medicines for pain and inflammation, like ibuprofen or naproxen -some diuretics like bumetanide, furosemide -teriparatide -thalidomide This list may not describe all possible interactions. Give your health care provider a list of all the medicines, herbs, non-prescription drugs, or dietary supplements you use. Also tell them if you smoke, drink alcohol, or use illegal drugs. Some items may interact with your medicine. What should I watch for while using this medicine? Visit your doctor or health care professional for regular checkups. It may be some time before you see the benefit from this medicine. Do not stop taking your medicine unless your doctor tells you to. Your doctor may order blood tests or other tests to see how you are doing. Women should inform their doctor if they wish to become pregnant or think they might be pregnant. There is a potential for serious side effects to an unborn child. Talk to  your health care professional or pharmacist for more information. You should make sure that you get enough calcium and vitamin D while you are taking this medicine. Discuss the foods you eat and the vitamins you take with  your health care professional. Some people who take this medicine have severe bone, joint, and/or muscle pain. This medicine may also increase your risk for jaw problems or a broken thigh bone. Tell your doctor right away if you have severe pain in your jaw, bones, joints, or muscles. Tell your doctor if you have any pain that does not go away or that gets worse. Tell your dentist and dental surgeon that you are taking this medicine. You should not have major dental surgery while on this medicine. See your dentist to have a dental exam and fix any dental problems before starting this medicine. Take good care of your teeth while on this medicine. Make sure you see your dentist for regular follow-up appointments. What side effects may I notice from receiving this medicine? Side effects that you should report to your doctor or health care professional as soon as possible: -allergic reactions like skin rash, itching or hives, swelling of the face, lips, or tongue -anxiety, confusion, or depression -breathing problems -changes in vision -eye pain -feeling faint or lightheaded, falls -jaw pain, especially after dental work -mouth sores -muscle cramps, stiffness, or weakness -redness, blistering, peeling or loosening of the skin, including inside the mouth -trouble passing urine or change in the amount of urine Side effects that usually do not require medical attention (report to your doctor or health care professional if they continue or are bothersome): -bone, joint, or muscle pain -constipation -diarrhea -fever -hair loss -irritation at site where injected -loss of appetite -nausea, vomiting -stomach upset -trouble sleeping -trouble swallowing -weak or tired This list may not describe all possible side effects. Call your doctor for medical advice about side effects. You may report side effects to FDA at 1-800-FDA-1088. Where should I keep my medicine? This drug is given in a hospital or  clinic and will not be stored at home. NOTE: This sheet is a summary. It may not cover all possible information. If you have questions about this medicine, talk to your doctor, pharmacist, or health care provider.  2019 Elsevier/Gold Standard (2014-04-04 14:19:39)

## 2019-04-03 NOTE — Progress Notes (Signed)
[name]  Patient Care Team: Patient, No Pcp Per as PCP - General (General Practice) Cordelia Poche, RN as Oncology Nurse Navigator [name], MD as Medical Oncologist (Hematology)  HEME/ONC OVERVIEW: 1. 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 and DS  -10/2018: MRI of the right shoulder for arm pain showed innumerable enhancing lesions throughout the right humerus, ribs and right scapula; no fractures -11/2018: baseline labs  Hgb 10, Ca 12.2, Cr 2.89, albumin 2.9  M-spike 3.7 g/dL, free lambda 7.8, monoclonal IgG lambda on IFE, quant IgG 5602, LDH 180, B2M 6.8  PET showed innumerable lytic lesions involving the axial and proximal appendicular skeleton and calvarium; no plasmacytoma   BM bx: normocellular marrow with plasma cell neoplasm (~64% of all cells); myeloma FISH showed trisomy 11 and gain of ATM gene (standard risk) -12/2018 - present: q21d RVd  2. 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  -S/p Xgeva x 1 for Ca 12.2; Zometa held due to multiple dental procedures -02/2019: pathologic left humerus fracture -03/2019 - present: q72monthZometa   TREATMENT REGIMEN:  01/09/2019 - present: q21day RVd; Revlimid dose reduced to 29mdaily due to fluctuating renal function  -C1 (2/20), C2 (3/12), C3 (4/2), C4 (4/23), C5 (5/14)  04/03/2019 - present: q3m62monthmeta; cleared by dentistry  ASSESSMENT & PLAN:   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 and DS  -S/p 4 cycles of RVd  -Labs adequate today, proceed with C5D1 of RVd  -[facility]; [facility] that [place]  -After some discussion today, [facility] the [facility] at the end of C5 of RVd to assess  response; if his disease continues to improve, I will reach 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  -S/p Xgeva x 1 for hypercalcemia in 11/2018 -His subsequent bone-protecting agent was on hold due to delayed dental evaluation and extensive dental procedures, including near full mouth extraction -Unfortunately, he had a pathologic fracture of the left humerus in end of 02/2019 -I discussed his case at length with his dentist, who was okay to resume bisphosphonate from a dental healing standpoint -Zometa resumed today  -Intramedullary nail placement scheduled on 04/08/2019 for the left humerus fracture  -On Ca-Vit D supplement BID   Chemotherapy-associated leukopenia -Secondary to chemotherapy -WBC 2.9k with ANC 2400, stable -Patient denies any symptoms of infection -We will monitor for now; no indication for dose adjustment -If leukopenia worsens in the future, we will consider delaying chemotherapy or adjusting chemotherapy dose  Chemotherapy-associated anemia -Secondary to chemotherapy -Hgb 11.8, stable -Patient denies any symptom of bleeding -We will monitor for now; no indication for dose adjustment -If anemia worsens in the future, we will consider delaying chemotherapy or adjusting chemotherapy dose  Chemotherapy-associated thrombocytopenia -Secondary to chemotherapy -Plts 132k, slightly lower than last week -Patient denies any symptoms of bleeding or excess bruising, such as epistaxis, hematochezia, melena, or hematuria -We will monitor for now; no indication for dose adjustment -If thrombocytopenia worsens in the future, we will consider delaying chemotherapy or adjusting chemotherapy dose  Orders Placed This Encounter  Procedures  . Multiple Myeloma Panel (SPEP&IFE w/QIG)    Standing Status:    Future    Standing Expiration Date:   05/07/2020  . Kappa/lambda light chains    Standing Status:   Future    Standing Expiration Date:   10/03/2020   All questions were answered. The patient knows to call the clinic with any problems, questions or concerns. No barriers to learning was detected.  Return at the start of C6D1 of RVd for labs and clinic follow-up,   [name], MD 04/03/2019 11:31 AM  CHIEF COMPLAINT: "I am doing fine"  INTERVAL HISTORY: Danny Juarez returns to clinic for follow-up of multiple myeloma on RVD.  Patient reports that he is scheduled for intramedullary nail placement on 04/08/2019.  He has occasional pain when he turns his left arm, but otherwise the pain has been relatively minimal, and he has not required taking any opioid medication for the left humerus fracture.  He is otherwise tolerating chemotherapy well, and denies any fever, chill, night sweats, weight change, chest pain, dyspnea, abdominal pain, nausea, vomiting, diarrhea, or abnormal rash.  SUMMARY OF ONCOLOGIC HISTORY:   Multiple myeloma ([place])   11/11/2018 Imaging    MRI right shoulder for arm pain showed innumerable enhancing lesions throughout the right humerus, ribs and right scapula; no fractures    11/29/2018 Miscellaneous    Baseline labs -Hgb 10, Ca 12.2, Cr 2.89, albumin 2.9 -M-spike 3.7 g/dL, free lambda 7.8, monoclonal IgG lambda on IFE, quant IgG 5602, LDH 180, B2M 6.8    01/09/2019 -  Chemotherapy    The patient had bortezomib SQ (VELCADE) chemo injection 2.5 mg, 1.3 mg/m2 = 2.5 mg, Subcutaneous,  Once, 5 of 7 cycles Administration: 2.5 mg (01/09/2019), 2.5 mg (01/16/2019), 2.5 mg (01/23/2019), 2.5 mg (01/30/2019), 2.5 mg (02/06/2019), 2.5 mg (02/13/2019), 2.5 mg (02/20/2019), 2.5 mg (02/27/2019), 2.5 mg (03/06/2019), 2.5 mg (03/13/2019), 2.5 mg (03/20/2019), 2.5 mg (03/27/2019)  for chemotherapy treatment.      REVIEW OF SYSTEMS:   Constitutional: ( - ) fevers, ( - )  chills , ( - ) night  sweats Eyes: ( - ) blurriness of vision, ( - ) double vision, ( - ) watery eyes Ears, nose, mouth, throat, and face: ( - ) mucositis, ( - ) sore throat Respiratory: ( - ) cough, ( - ) dyspnea, ( - ) wheezes Cardiovascular: ( - ) palpitation, ( - ) chest discomfort, ( - ) lower extremity swelling Gastrointestinal:  ( - ) nausea, ( - ) heartburn, ( - ) change in bowel habits Skin: ( - ) abnormal skin rashes Lymphatics: ( - ) new lymphadenopathy, ( - ) easy bruising Neurological: ( - ) numbness, ( - ) tingling, ( - ) new weaknesses Behavioral/Psych: ( - ) mood change, ( - ) new changes  All other systems were reviewed with the patient and are negative.  I have reviewed the past medical history, past surgical history, social history and family history with the patient and they are unchanged from previous note.  ALLERGIES:  has No Known Allergies.  MEDICATIONS:  Current Outpatient Medications  Medication Sig Dispense Refill  . acyclovir (ZOVIRAX) 400 MG tablet Take 1 tablet (400 mg total) by mouth 2 (two) times daily. 60 tablet 3  . aspirin EC 81 MG tablet Take 81 mg by mouth daily.    . calcium acetate (PHOSLO) 667 MG capsule Take 667 mg by mouth 3 (three) times daily.    Marland Kitchen  chlorhexidine (PERIDEX) 0.12 % solution     . dexamethasone (DECADRON) 4 MG tablet Take 10 tablets (40 mg) on days 1, 8, and 15 of chemo. Repeat every 21 days. 30 tablet 3  . ferrous sulfate 325 (65 FE) MG EC tablet Take 325 mg by mouth 3 (three) times daily with meals.    Marland Kitchen HYDROcodone-acetaminophen (NORCO/VICODIN) 5-325 MG tablet Take 1-2 tablets by mouth every 4 (four) hours as needed. 8 tablet 0  . lidocaine-prilocaine (EMLA) cream Apply to affected area once 30 g 3  . LORazepam (ATIVAN) 0.5 MG tablet Take 1 tablet (0.5 mg total) by mouth every 6 (six) hours as needed (Nausea or vomiting). 30 tablet 0  . morphine (MSIR) 15 MG tablet Take 1 tablet (15 mg total) by mouth every 4 (four) hours as needed for severe pain. 60  tablet 0  . Multiple Vitamin (MULTIVITAMIN WITH MINERALS) TABS Take 1 tablet by mouth daily.    . ondansetron (ZOFRAN) 8 MG tablet Take 1 tablet (8 mg total) by mouth 2 (two) times daily as needed (Nausea or vomiting). 30 tablet 1  . prochlorperazine (COMPAZINE) 10 MG tablet Take 1 tablet (10 mg total) by mouth every 6 (six) hours as needed (Nausea or vomiting). 30 tablet 1  . REVLIMID 20 MG capsule TAKE 1 CAPSULE BY MOUTH  DAILY. SWALLOW WHOLE DO NOT BREAK, CRUSH, OR CHEW  CAPSULE. 14 capsule 0   No current facility-administered medications for this visit.    Facility-Administered Medications Ordered in Other Visits  Medication Dose Route Frequency Provider Last Rate Last Dose  . 0.9 %  sodium chloride infusion   Intravenous Once [name], MD      . bortezomib SQ (VELCADE) chemo injection 2.5 mg  1.3 mg/m2 (Treatment Plan Recorded) Subcutaneous Once [name], MD      . prochlorperazine (COMPAZINE) tablet 10 mg  10 mg Oral Once [name], MD      . Zoledronic Acid (ZOMETA) IVPB 4 mg  4 mg Intravenous Once [name], MD        PHYSICAL EXAMINATION: ECOG PERFORMANCE STATUS: 1 - Symptomatic but completely ambulatory  Today's Vitals   04/03/19 1109  BP: 98/61  Pulse: (!) 57  Resp: 17  SpO2: 100%  PainSc: 0-No pain   There is no height or weight on file to calculate BMI.  There were no vitals filed for this visit.  GENERAL: alert, no distress and comfortable SKIN: skin color, texture, turgor are normal, no rashes or significant lesions EYES: conjunctiva are pink and non-injected, sclera clear OROPHARYNX: no exudate, no erythema; lips, buccal mucosa, and tongue normal  NECK: supple, non-tender LUNGS: clear to auscultation with normal breathing effort HEART: regular rate & rhythm and no murmurs and no lower extremity edema ABDOMEN: soft, non-tender, non-distended, normal bowel sounds Musculoskeletal: left arm in a sling PSYCH: alert & oriented x 3, fluent speech NEURO: no focal  motor/sensory deficits  LABORATORY DATA:  I have reviewed the data as listed    Component Value Date/Time   NA 137 04/03/2019 1024   K 4.0 04/03/2019 1024   CL 102 04/03/2019 1024   CO2 28 04/03/2019 1024   GLUCOSE 111 (H) 04/03/2019 1024   BUN 28 (H) 04/03/2019 1024   CREATININE 0.84 04/03/2019 1024   CALCIUM 9.4 04/03/2019 1024   PROT 6.8 04/03/2019 1024   ALBUMIN 3.8 04/03/2019 1024   AST 22 04/03/2019 1024   ALT 21 04/03/2019 1024   ALKPHOS 89 04/03/2019 1024  BILITOT 0.4 04/03/2019 1024   GFRNONAA >60 04/03/2019 1024   GFRAA >60 04/03/2019 1024    No results found for: SPEP, UPEP  Lab Results  Component Value Date   WBC 2.9 (L) 04/03/2019   NEUTROABS 2.4 04/03/2019   HGB 11.8 (L) 04/03/2019   HCT 34.6 (L) 04/03/2019   MCV 93.0 04/03/2019   PLT 132 (L) 04/03/2019      Chemistry      Component Value Date/Time   NA 137 04/03/2019 1024   K 4.0 04/03/2019 1024   CL 102 04/03/2019 1024   CO2 28 04/03/2019 1024   BUN 28 (H) 04/03/2019 1024   CREATININE 0.84 04/03/2019 1024      Component Value Date/Time   CALCIUM 9.4 04/03/2019 1024   ALKPHOS 89 04/03/2019 1024   AST 22 04/03/2019 1024   ALT 21 04/03/2019 1024   BILITOT 0.4 04/03/2019 1024       RADIOGRAPHIC STUDIES: I have personally reviewed the radiological images as listed below and agreed with the findings in the report. Dg Humerus Left  Result Date: 03/19/2019 CLINICAL DATA:  Left upper arm pain.  History of multiple myeloma. EXAM: LEFT HUMERUS - 2+ VIEW COMPARISON:  None. FINDINGS: Mildly displaced pathologic fracture seen involving the midshaft of the left humerus. Innumerable rounded lucencies are seen throughout the left humerus and other visualized skeletal structures consistent with history of multiple myeloma. IMPRESSION: Mildly displaced pathologic fracture involving midshaft of left humerus. Findings consistent with multiple myeloma. Electronically Signed   By: Marijo Conception M.D.   On:  03/19/2019 10:19

## 2019-04-03 NOTE — Progress Notes (Signed)
Ok to treat with Chemo and Zometa per Dr. Maylon Peppers.  Chem reviewed from 7 days ago

## 2019-04-04 ENCOUNTER — Other Ambulatory Visit (HOSPITAL_COMMUNITY)
Admission: RE | Admit: 2019-04-04 | Discharge: 2019-04-04 | Disposition: A | Discharge status: Home / Self Care | Payer: 59 | Source: Ambulatory Visit | Attending: Orthopedic Surgery | Admitting: Orthopedic Surgery

## 2019-04-04 DIAGNOSIS — Z1159 Encounter for screening for other viral diseases: Secondary | ICD-10-CM | POA: Diagnosis not present

## 2019-04-05 LAB — NOVEL CORONAVIRUS, NAA (HOSP ORDER, SEND-OUT TO REF LAB; TAT 18-24 HRS): SARS-CoV-2, NAA: NOT DETECTED

## 2019-04-07 ENCOUNTER — Other Ambulatory Visit: Payer: Self-pay

## 2019-04-07 ENCOUNTER — Encounter (HOSPITAL_COMMUNITY): Payer: Self-pay | Admitting: *Deleted

## 2019-04-07 ENCOUNTER — Telehealth: Payer: Self-pay | Admitting: Hematology

## 2019-04-07 DIAGNOSIS — Z7982 Long term (current) use of aspirin: Secondary | ICD-10-CM | POA: Diagnosis not present

## 2019-04-07 DIAGNOSIS — C9000 Multiple myeloma not having achieved remission: Secondary | ICD-10-CM | POA: Diagnosis not present

## 2019-04-07 DIAGNOSIS — D6481 Anemia due to antineoplastic chemotherapy: Secondary | ICD-10-CM | POA: Diagnosis not present

## 2019-04-07 DIAGNOSIS — C7951 Secondary malignant neoplasm of bone: Secondary | ICD-10-CM | POA: Diagnosis not present

## 2019-04-07 DIAGNOSIS — Z79899 Other long term (current) drug therapy: Secondary | ICD-10-CM | POA: Diagnosis not present

## 2019-04-07 DIAGNOSIS — W19XXXA Unspecified fall, initial encounter: Secondary | ICD-10-CM | POA: Diagnosis not present

## 2019-04-07 DIAGNOSIS — Z86711 Personal history of pulmonary embolism: Secondary | ICD-10-CM | POA: Diagnosis not present

## 2019-04-07 DIAGNOSIS — S42302A Unspecified fracture of shaft of humerus, left arm, initial encounter for closed fracture: Secondary | ICD-10-CM | POA: Diagnosis not present

## 2019-04-07 DIAGNOSIS — M84522A Pathological fracture in neoplastic disease, left humerus, initial encounter for fracture: Secondary | ICD-10-CM | POA: Diagnosis not present

## 2019-04-07 NOTE — Progress Notes (Addendum)
Mr Torre denies chest pain or shortness of  Breath.  Patient denies any COVID symptoms. Mr Lenoir had COVID screen on Friday, May 15, it was negative, patient has been quarantined at his home, alone.  Patient's daughter is arriving today for [facility], she has been home with her husband;.  I informed patient that she needs to wear a mask in his presence and stay separated as much as possible, at least 6 feet away.  I asked Mr. Sheehan if someone could come and get the Pre- Surgery beverage.  Patient said his daughter can come get it later today. I instructed patient nothing to eat after midnight, but he may have clear liquids until 4:39 AM.  We discussed what clear liquids are, patient voiced understanding.  I instructed patient to drink the Pre Surgery drink between 4 - 4:30 AM, be finished by 4:30.  I also instructed patient on Pre Surgery shower and put the surgical scrub in the bag along with instructions about ERAS and Pre- Surgery shower. After speaking to [employer], I called patient back and left a voice mail message asking patient and daughter, both wear a mask and to stay at least 6 feet apart.

## 2019-04-07 NOTE — Anesthesia Preprocedure Evaluation (Addendum)
Anesthesia Evaluation  Patient identified by MRN, date of birth, ID band Patient awake    Reviewed: Allergy & Precautions, NPO status , Patient's Chart, lab work & pertinent test results  History of Anesthesia Complications Negative for: history of anesthetic complications  Airway Mallampati: II  TM Distance: >3 FB Neck ROM: Full    Dental  (+) Missing   Pulmonary PE   Pulmonary exam normal        Cardiovascular negative cardio ROS Normal cardiovascular exam     Neuro/Psych negative neurological ROS  negative psych ROS   GI/Hepatic negative GI ROS, Neg liver ROS,   Endo/Other  negative endocrine ROS  Renal/GU negative Renal ROS  negative genitourinary   Musculoskeletal negative musculoskeletal ROS (+)   Abdominal   Peds  Hematology  (+) anemia , Multiple myeloma on chemo with associated pancytopenia   Anesthesia Other Findings 63 yo M with pathologic midshaft humerus fx  - multiple myeloma on chemo, distant h/o PE - Hgb 11.8, plts 132 - COVID neg 5/15  Reproductive/Obstetrics                           Anesthesia Physical Anesthesia Plan  ASA: III  Anesthesia Plan: General   Post-op Pain Management: GA combined w/ Regional for post-op pain   Induction: Intravenous and Rapid sequence  PONV Risk Score and Plan: 2 and Ondansetron, Dexamethasone, Midazolam and Treatment may vary due to age or medical condition  Airway Management Planned: Oral ETT  Additional Equipment: None  Intra-op Plan:   Post-operative Plan: Extubation in OR  Informed Consent: I have reviewed the patients History and Physical, chart, labs and discussed the procedure including the risks, benefits and alternatives for the proposed anesthesia with the patient or authorized representative who has indicated his/her understanding and acceptance.     Dental advisory given  Plan Discussed with:   Anesthesia  Plan Comments:        Anesthesia Quick Evaluation

## 2019-04-07 NOTE — Pre-Procedure Instructions (Signed)
Danny Juarez  04/07/2019      Your procedure is scheduled on Tuesday, may 19..  Report to [facility], Main Entrance or Entrance "A" at 5:30 A.M.               Your surgery or procedure is scheduled for  7:30 AM   Call this number if you have problems the morning of surgery: [phone]  This is the number for the Pre- Surgical Desk.     Remember:  Do not eat after midnight.  You may drink clear liquids until 4:30 AM.  Clear liquids allowed are:        Water, Juice (non-citric and without pulp), Carbonated beverages, Clear Tea, Black Coffee only, Plain Jell-O only, Gatorade and Plain Popsicles only     Drink Ensure Pre- Surgery dink, between 4 - 4:30 AM  Then nothing else by mouth.   Take these medicines the morning of surgery with A SIP OF WATER : Revlimid   [facility]- Preparing For Surgery  Before surgery, you can play an important role. Because skin is not sterile, your skin needs to be as free of germs as possible. You can reduce the number of germs on your skin by washing with CHG (chlorahexidine gluconate) Soap before surgery.  CHG is an antiseptic cleaner which kills germs and bonds with the skin to continue killing germs even after washing.    Oral Hygiene is also important to reduce your risk of infection.  Remember - BRUSH YOUR TEETH THE MORNING OF SURGERY WITH YOUR REGULAR TOOTHPASTE  Please do not use if you have an allergy to CHG or antibacterial soaps. If your skin becomes reddened/irritated stop using the CHG.  Do not shave (including legs and underarms) for at least 48 hours prior to first CHG shower. It is OK to shave your face.  Please follow these instructions carefully.   1. Shower the NIGHT BEFORE SURGERY and the MORNING OF SURGERY with CHG.   2. If you chose to wash your hair, wash your hair first as usual with your normal shampoo.  3. After you shampoo, wash your face and private area with the soap you use at home, then rinse your hair  and body thoroughly to remove the shampoo and soap.  4. Use CHG as you would any other liquid soap. You can apply CHG directly to the skin and wash gently with a scrungie or a clean washcloth.   5. Apply the CHG Soap to your body ONLY FROM THE NECK DOWN.  Do not use on open wounds or open sores. Avoid contact with your eyes, ears, mouth and genitals (private parts).  6. Wash thoroughly, paying special attention to the area where your surgery will be performed.  7. Thoroughly rinse your body with warm water from the neck down.  8. DO NOT shower/wash with your normal soap after using and rinsing off the CHG Soap.  9. Pat yourself dry with a CLEAN TOWEL.  10. Wear CLEAN PAJAMAS to bed the night before surgery, wear comfortable clothes the morning of surgery  11. Place CLEAN SHEETS on your bed the night of your first shower and DO NOT SLEEP WITH PETS.  Day of Surgery: Shower as instructed above. Do not wear lotions, powders, or perfumes, or deodorant. Please wear clean clothes to the hospital/surgery center.   Remember to brush your teeth WITH YOUR REGULAR TOOTHPASTE. Do not wear jewelry .  Men may shave face and neck.  Do not bring valuables to the hospital.  [facility] is not responsible for any belongings or valuables.

## 2019-04-07 NOTE — Telephone Encounter (Signed)
Appointments scheduled letter/calendar mailed  °

## 2019-04-08 ENCOUNTER — Encounter (HOSPITAL_COMMUNITY)
Admission: RE | Disposition: A | Discharge status: Home / Self Care | Payer: Self-pay | Source: Home / Self Care | Attending: Orthopedic Surgery

## 2019-04-08 ENCOUNTER — Ambulatory Visit (HOSPITAL_COMMUNITY)
Admission: RE | Admit: 2019-04-08 | Discharge: 2019-04-08 | Disposition: A | Discharge status: Home / Self Care | Payer: 59 | Attending: Orthopedic Surgery | Admitting: Orthopedic Surgery

## 2019-04-08 ENCOUNTER — Ambulatory Visit (HOSPITAL_COMMUNITY): Payer: 59

## 2019-04-08 ENCOUNTER — Other Ambulatory Visit: Payer: Self-pay

## 2019-04-08 ENCOUNTER — Ambulatory Visit (HOSPITAL_COMMUNITY): Payer: 59 | Admitting: Anesthesiology

## 2019-04-08 ENCOUNTER — Encounter (HOSPITAL_COMMUNITY): Payer: Self-pay

## 2019-04-08 DIAGNOSIS — S42302A Unspecified fracture of shaft of humerus, left arm, initial encounter for closed fracture: Secondary | ICD-10-CM | POA: Insufficient documentation

## 2019-04-08 DIAGNOSIS — Z79899 Other long term (current) drug therapy: Secondary | ICD-10-CM | POA: Insufficient documentation

## 2019-04-08 DIAGNOSIS — C7951 Secondary malignant neoplasm of bone: Secondary | ICD-10-CM | POA: Insufficient documentation

## 2019-04-08 DIAGNOSIS — C9000 Multiple myeloma not having achieved remission: Secondary | ICD-10-CM | POA: Insufficient documentation

## 2019-04-08 DIAGNOSIS — Z86711 Personal history of pulmonary embolism: Secondary | ICD-10-CM | POA: Insufficient documentation

## 2019-04-08 DIAGNOSIS — W19XXXA Unspecified fall, initial encounter: Secondary | ICD-10-CM | POA: Insufficient documentation

## 2019-04-08 DIAGNOSIS — D6481 Anemia due to antineoplastic chemotherapy: Secondary | ICD-10-CM | POA: Insufficient documentation

## 2019-04-08 DIAGNOSIS — M84522A Pathological fracture in neoplastic disease, left humerus, initial encounter for fracture: Secondary | ICD-10-CM | POA: Insufficient documentation

## 2019-04-08 DIAGNOSIS — Z7982 Long term (current) use of aspirin: Secondary | ICD-10-CM | POA: Insufficient documentation

## 2019-04-08 DIAGNOSIS — Z419 Encounter for procedure for purposes other than remedying health state, unspecified: Secondary | ICD-10-CM

## 2019-04-08 DIAGNOSIS — M84622A Pathological fracture in other disease, left humerus, initial encounter for fracture: Secondary | ICD-10-CM | POA: Diagnosis present

## 2019-04-08 HISTORY — DX: Thrombocytosis, unspecified: D75.839

## 2019-04-08 HISTORY — DX: Decreased white blood cell count, unspecified: D72.819

## 2019-04-08 HISTORY — DX: Malignant (primary) neoplasm, unspecified: C80.1

## 2019-04-08 HISTORY — PX: HUMERUS IM NAIL: SHX1769

## 2019-04-08 HISTORY — DX: Anemia, unspecified: D64.9

## 2019-04-08 SURGERY — INSERTION, INTRAMEDULLARY ROD, HUMERUS
Anesthesia: General | Site: Arm Upper | Laterality: Left

## 2019-04-08 MED ORDER — DEXAMETHASONE SODIUM PHOSPHATE 10 MG/ML IJ SOLN
INTRAMUSCULAR | Status: AC
  Filled 2019-04-08: qty 1

## 2019-04-08 MED ORDER — CHLORHEXIDINE GLUCONATE 4 % EX LIQD: 60.0000 mL | Freq: Once | CUTANEOUS | Status: DC

## 2019-04-08 MED ORDER — SODIUM CHLORIDE 0.9 % IR SOLN
Status: DC | PRN
  Administered 2019-04-08: 1000 mL

## 2019-04-08 MED ORDER — EPHEDRINE SULFATE-NACL 50-0.9 MG/10ML-% IV SOSY
PREFILLED_SYRINGE | INTRAVENOUS | Status: DC | PRN
  Administered 2019-04-08: 10 mg via INTRAVENOUS
  Administered 2019-04-08: 5 mg via INTRAVENOUS

## 2019-04-08 MED ORDER — OXYCODONE HCL 5 MG PO TABS: 5.0000 mg | ORAL_TABLET | Freq: Once | ORAL | Status: DC | PRN

## 2019-04-08 MED ORDER — PHENYLEPHRINE 40 MCG/ML (10ML) SYRINGE FOR IV PUSH (FOR BLOOD PRESSURE SUPPORT)
PREFILLED_SYRINGE | INTRAVENOUS | Status: AC
  Filled 2019-04-08: qty 10

## 2019-04-08 MED ORDER — PROPOFOL 10 MG/ML IV BOLUS
INTRAVENOUS | Status: DC | PRN
  Administered 2019-04-08: 150 mg via INTRAVENOUS

## 2019-04-08 MED ORDER — FENTANYL CITRATE (PF) 100 MCG/2ML IJ SOLN: 25.0000 ug | INTRAMUSCULAR | Status: DC | PRN

## 2019-04-08 MED ORDER — HYDROMORPHONE HCL 2 MG PO TABS: 2.0000 mg | ORAL_TABLET | ORAL | 0 refills | Status: AC | PRN

## 2019-04-08 MED ORDER — LACTATED RINGERS IV SOLN
INTRAVENOUS | Status: DC | PRN
  Administered 2019-04-08: 07:00:00 via INTRAVENOUS

## 2019-04-08 MED ORDER — LIDOCAINE 2% (20 MG/ML) 5 ML SYRINGE
INTRAMUSCULAR | Status: DC | PRN
  Administered 2019-04-08: 100 mg via INTRAVENOUS

## 2019-04-08 MED ORDER — DEXAMETHASONE SODIUM PHOSPHATE 10 MG/ML IJ SOLN
INTRAMUSCULAR | Status: DC | PRN
  Administered 2019-04-08: 5 mg via INTRAVENOUS

## 2019-04-08 MED ORDER — FENTANYL CITRATE (PF) 250 MCG/5ML IJ SOLN
INTRAMUSCULAR | Status: DC | PRN
  Administered 2019-04-08: 50 ug via INTRAVENOUS

## 2019-04-08 MED ORDER — LIDOCAINE 2% (20 MG/ML) 5 ML SYRINGE
INTRAMUSCULAR | Status: AC
  Filled 2019-04-08: qty 5

## 2019-04-08 MED ORDER — MIDAZOLAM HCL 2 MG/2ML IJ SOLN
INTRAMUSCULAR | Status: DC | PRN
  Administered 2019-04-08 (×2): 1 mg via INTRAVENOUS

## 2019-04-08 MED ORDER — MIDAZOLAM HCL 2 MG/2ML IJ SOLN
INTRAMUSCULAR | Status: AC
  Filled 2019-04-08: qty 2

## 2019-04-08 MED ORDER — ONDANSETRON HCL 4 MG/2ML IJ SOLN
INTRAMUSCULAR | Status: AC
  Filled 2019-04-08: qty 2

## 2019-04-08 MED ORDER — SODIUM CHLORIDE 0.9 % IV SOLN
INTRAVENOUS | Status: DC | PRN
  Administered 2019-04-08: 08:00:00 20 ug/min via INTRAVENOUS

## 2019-04-08 MED ORDER — OXYCODONE HCL 5 MG/5ML PO SOLN: 5.0000 mg | Freq: Once | ORAL | Status: DC | PRN

## 2019-04-08 MED ORDER — ONDANSETRON HCL 4 MG/2ML IJ SOLN: 4.0000 mg | Freq: Once | INTRAMUSCULAR | Status: DC | PRN

## 2019-04-08 MED ORDER — CEFAZOLIN SODIUM-DEXTROSE 2-4 GM/100ML-% IV SOLN
2.0000 g | INTRAVENOUS | Status: AC
  Administered 2019-04-08: 2 g via INTRAVENOUS
  Filled 2019-04-08: qty 100

## 2019-04-08 MED ORDER — BUPIVACAINE LIPOSOME 1.3 % IJ SUSP
INTRAMUSCULAR | Status: DC | PRN
  Administered 2019-04-08: 10 mL

## 2019-04-08 MED ORDER — FENTANYL CITRATE (PF) 250 MCG/5ML IJ SOLN
INTRAMUSCULAR | Status: AC
  Filled 2019-04-08: qty 5

## 2019-04-08 MED ORDER — ONDANSETRON 4 MG PO TBDP: 4.0000 mg | ORAL_TABLET | Freq: Three times a day (TID) | ORAL | 0 refills | Status: DC | PRN

## 2019-04-08 MED ORDER — PROPOFOL 10 MG/ML IV BOLUS
INTRAVENOUS | Status: AC
  Filled 2019-04-08: qty 20

## 2019-04-08 MED ORDER — ONDANSETRON HCL 4 MG/2ML IJ SOLN
INTRAMUSCULAR | Status: DC | PRN
  Administered 2019-04-08: 4 mg via INTRAVENOUS

## 2019-04-08 MED ORDER — PHENYLEPHRINE 40 MCG/ML (10ML) SYRINGE FOR IV PUSH (FOR BLOOD PRESSURE SUPPORT)
PREFILLED_SYRINGE | INTRAVENOUS | Status: DC | PRN
  Administered 2019-04-08: 80 ug via INTRAVENOUS
  Administered 2019-04-08: 120 ug via INTRAVENOUS
  Administered 2019-04-08 (×2): 80 ug via INTRAVENOUS

## 2019-04-08 MED ORDER — BUPIVACAINE HCL (PF) 0.5 % IJ SOLN
INTRAMUSCULAR | Status: DC | PRN
  Administered 2019-04-08: 15 mL via PERINEURAL

## 2019-04-08 MED ORDER — SUCCINYLCHOLINE CHLORIDE 200 MG/10ML IV SOSY
PREFILLED_SYRINGE | INTRAVENOUS | Status: DC | PRN
  Administered 2019-04-08: 80 mg via INTRAVENOUS

## 2019-04-08 SURGICAL SUPPLY — 60 items
BIT DRILL 3.8X270 (BIT) ×1 IMPLANT
BIT DRILL 6.5 CONICAL (BIT) ×1 IMPLANT
BIT DRILL QC LCP 2.8X165 (BIT) ×1 IMPLANT
BIT DRILL SHORT 3.2MM (DRILL) IMPLANT
COVER SURGICAL LIGHT HANDLE (MISCELLANEOUS) ×2 IMPLANT
COVER WAND RF STERILE (DRAPES) ×2 IMPLANT
DRAPE INCISE IOBAN 66X45 STRL (DRAPES) ×2 IMPLANT
DRAPE ORTHO SPLIT 77X108 STRL (DRAPES) ×2
DRAPE SURG ORHT 6 SPLT 77X108 (DRAPES) ×2 IMPLANT
DRAPE U-SHAPE 47X51 STRL (DRAPES) ×2 IMPLANT
DRILL SHORT 3.2MM (DRILL) ×2
DRSG ADAPTIC 3X8 NADH LF (GAUZE/BANDAGES/DRESSINGS) ×1 IMPLANT
DRSG AQUACEL AG ADV 3.5X 4 (GAUZE/BANDAGES/DRESSINGS) IMPLANT
DRSG AQUACEL AG ADV 3.5X 6 (GAUZE/BANDAGES/DRESSINGS) IMPLANT
DRSG PAD ABDOMINAL 8X10 ST (GAUZE/BANDAGES/DRESSINGS) ×2 IMPLANT
DURAPREP 26ML APPLICATOR (WOUND CARE) ×2 IMPLANT
ELECT REM PT RETURN 9FT ADLT (ELECTROSURGICAL) ×2
ELECTRODE REM PT RTRN 9FT ADLT (ELECTROSURGICAL) ×1 IMPLANT
GAUZE SPONGE 4X4 12PLY STRL (GAUZE/BANDAGES/DRESSINGS) ×1 IMPLANT
GLOVE BIO SURGEON STRL SZ7.5 (GLOVE) ×2 IMPLANT
GLOVE BIOGEL PI IND STRL 8 (GLOVE) ×1 IMPLANT
GLOVE BIOGEL PI INDICATOR 8 (GLOVE) ×1
GOWN STRL REUS W/ TWL LRG LVL3 (GOWN DISPOSABLE) ×1 IMPLANT
GOWN STRL REUS W/ TWL XL LVL3 (GOWN DISPOSABLE) ×1 IMPLANT
GOWN STRL REUS W/TWL LRG LVL3 (GOWN DISPOSABLE) ×1
GOWN STRL REUS W/TWL XL LVL3 (GOWN DISPOSABLE) ×1
GUIDEROD SS 2.5MMX280MM (ROD) ×1 IMPLANT
KIT BASIN OR (CUSTOM PROCEDURE TRAY) ×2 IMPLANT
KIT TURNOVER KIT B (KITS) ×2 IMPLANT
MANIFOLD NEPTUNE II (INSTRUMENTS) ×2 IMPLANT
NAIL HUM MULTILOC 7X255 LT (Nail) ×1 IMPLANT
NS IRRIG 1000ML POUR BTL (IV SOLUTION) ×2 IMPLANT
PACK SHOULDER (CUSTOM PROCEDURE TRAY) ×2 IMPLANT
PAD ARMBOARD 7.5X6 YLW CONV (MISCELLANEOUS) ×4 IMPLANT
ROD REAMING 2.5 (Rod) ×1 IMPLANT
SCREW LOCK 4.5X44 (Screw) ×1 IMPLANT
SCREW LOCK MULTILOC FT 4.5X46 (Screw) ×1 IMPLANT
SCREW LOCKING 4.0 28MM (Screw) ×1 IMPLANT
SCREW LOCKING 4.0 34MM (Screw) ×1 IMPLANT
SCREW TI MULTILOC 4.5X42 (Screw) ×1 IMPLANT
SLING ARM FOAM STRAP LRG (SOFTGOODS) ×1 IMPLANT
SLING ARM FOAM STRAP MED (SOFTGOODS) IMPLANT
SPONGE LAP 18X18 RF (DISPOSABLE) IMPLANT
SPONGE LAP 4X18 RFD (DISPOSABLE) IMPLANT
STAPLER VISISTAT 35W (STAPLE) ×1 IMPLANT
STRIP CLOSURE SKIN 1/2X4 (GAUZE/BANDAGES/DRESSINGS) IMPLANT
SUCTION FRAZIER HANDLE 10FR (MISCELLANEOUS) ×1
SUCTION TUBE FRAZIER 10FR DISP (MISCELLANEOUS) ×1 IMPLANT
SUT FIBERWIRE #2 38 T-5 BLUE (SUTURE)
SUT MNCRL AB 4-0 PS2 18 (SUTURE) IMPLANT
SUT VIC AB 0 CT1 27 (SUTURE) ×1
SUT VIC AB 0 CT1 27XBRD ANBCTR (SUTURE) IMPLANT
SUT VIC AB 2-0 CT1 27 (SUTURE) ×2
SUT VIC AB 2-0 CT1 TAPERPNT 27 (SUTURE) ×1 IMPLANT
SUTURE FIBERWR #2 38 T-5 BLUE (SUTURE) IMPLANT
SYR CONTROL 10ML LL (SYRINGE) IMPLANT
TOWEL OR 17X24 6PK STRL BLUE (TOWEL DISPOSABLE) ×2 IMPLANT
TOWEL OR 17X26 10 PK STRL BLUE (TOWEL DISPOSABLE) ×2 IMPLANT
WATER STERILE IRR 1000ML POUR (IV SOLUTION) ×2 IMPLANT
YANKAUER SUCT BULB TIP NO VENT (SUCTIONS) ×2 IMPLANT

## 2019-04-08 NOTE — Transfer of Care (Signed)
Immediate Anesthesia Transfer of Care Note  Patient: Danny Juarez  Procedure(s) Performed: INTRAMEDULLARY (IM) NAIL HUMERUS (Left Arm Upper)  Patient Location: PACU  Anesthesia Type:General  Level of Consciousness: awake, alert  and oriented  Airway & Oxygen Therapy: Patient Spontanous Breathing and Patient connected to face mask oxygen  Post-op Assessment: Report given to RN and Post -op Vital signs reviewed and stable  Post vital signs: Reviewed and stable  Last Vitals:  Vitals Value Taken Time  BP 113/65 04/08/2019  9:35 AM  Temp    Pulse 74 04/08/2019  9:41 AM  Resp 16 04/08/2019  9:41 AM  SpO2 96 % 04/08/2019  9:41 AM  Vitals shown include unvalidated device data.  Last Pain:  Vitals:   04/08/19 0554  TempSrc:   PainSc: 0-No pain      Patients Stated Pain Goal: 3 (91/69/45 0388)  Complications: No apparent anesthesia complications

## 2019-04-08 NOTE — H&P (Signed)
ORTHOPAEDIC H and P  REQUESTING PHYSICIAN: Nicholes Stairs, MD  PCP:  Patient, No Pcp Per  Chief Complaint: Left humerus fracture  HPI: Danny Juarez is a 63 y.o. male who complains of pathologic fracture to left humerus.  He has a recent diagnosis of multiple myeloma.  He did have a low-energy fall and sustained the midshaft left humerus fracture.  He is here today for intramedullary nail placement to span the fracture and the multiple lytic lesions of the humerus.  We have previously visited in the office and discussed this procedure at length.  He has no new complaints today and no new issues.  Past Medical History:  Diagnosis Date  . Anemia    due to chemo   . Cancer ([place])    Multiple myeloma with  Bone Mets  . Leukopenia    chemo related  . PE (pulmonary embolism) 1996    "3 in right lung"  . 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 KNEE ARTHROSCOPY Left    Social History   Socioeconomic History  . Marital status: Divorced    Spouse name: Not on file  . Number of children: 1  . Years of education: Not on file  . Highest education level: Not on file  Occupational History  . Not on file  Social Needs  . Financial resource strain: Not on file  . Food insecurity:    Worry: Not on file    Inability: Not on file  . Transportation needs:    Medical: Not on file    Non-medical: Not on file  Tobacco Use  . Smoking status: Never Smoker  . Smokeless tobacco: Never Used  Substance and Sexual Activity  . Alcohol use: Yes    Comment: occassional  . Drug use: No  . Sexual activity: Not on file  Lifestyle  . Physical activity:    Days per week: Not on file    Minutes per session: Not on file  . Stress: Not on file  Relationships  . Social connections:    Talks on phone: Not on file    Gets together: Not on file    Attends religious service: Not on file    Active member of club or organization: Not on  file    Attends meetings of clubs or organizations: Not on file    Relationship status: Not on file  Other Topics Concern  . Not on file  Social History Narrative   Patient is divorced with one daughter that lives in [place], [place].   Patient has never smoked nor used smokeless tobacco.   Patient with occasional use of alcohol.   Patient denies use of illicit drugs.   Patient is a retired Set designer.   History reviewed. No pertinent family history. No Known Allergies Prior to Admission medications   Medication Sig Start Date End Date Taking? Authorizing Provider  aspirin EC 81 MG tablet Take 81 mg by mouth daily.   Yes [provider]  CALCIUM-VITAMIN D PO Take 1 tablet by mouth 2 (two) times a day.   Yes [provider]  dexamethasone (DECADRON) 4 MG tablet Take 10 tablets (40 mg) on days 1, 8, and 15 of chemo. Repeat every 21 days. 12/20/18  Yes [name], MD  ferrous sulfate 325 (65 FE) MG EC tablet Take 325 mg by mouth 3 (three) times daily with meals.  Yes [provider]  HYDROcodone-acetaminophen (NORCO/VICODIN) 5-325 MG tablet Take 1-2 tablets by mouth every 4 (four) hours as needed. 03/19/19  Yes Davonna Belling, MD  lidocaine-prilocaine (EMLA) cream Apply to affected area once 12/20/18  Yes [name], MD  LORazepam (ATIVAN) 0.5 MG tablet Take 1 tablet (0.5 mg total) by mouth every 6 (six) hours as needed (Nausea or vomiting). 12/20/18  Yes [name], MD  morphine (MSIR) 15 MG tablet Take 1 tablet (15 mg total) by mouth every 4 (four) hours as needed for severe pain. 03/20/19  Yes [name], MD  Multiple Vitamin (MULTIVITAMIN WITH MINERALS) TABS Take 1 tablet by mouth daily.   Yes [provider]  ondansetron (ZOFRAN) 8 MG tablet Take 1 tablet (8 mg total) by mouth 2 (two) times daily as needed (Nausea or vomiting). 12/20/18  Yes [name], MD  prochlorperazine (COMPAZINE) 10 MG tablet Take 1 tablet (10 mg total) by mouth  every 6 (six) hours as needed (Nausea or vomiting). 12/20/18  Yes [name], MD  REVLIMID 20 MG capsule TAKE 1 CAPSULE BY MOUTH  DAILY. SWALLOW WHOLE DO NOT BREAK, CRUSH, OR CHEW  CAPSULE. Patient taking differently: Take 20 mg by mouth daily.  03/10/19  Yes Ennever, Rudell Cobb, MD  acyclovir (ZOVIRAX) 400 MG tablet Take 1 tablet (400 mg total) by mouth 2 (two) times daily. Patient taking differently: Take 400 mg by mouth 2 (two) times daily as needed (fever blisters).  01/30/19   [name], MD   No results found.  Positive ROS: All other systems have been reviewed and were otherwise negative with the exception of those mentioned in the HPI and as above.  Physical Exam: General: Alert, no acute distress Cardiovascular: No pedal edema Respiratory: No cyanosis, no use of accessory musculature GI: No organomegaly, abdomen is soft and non-tender Skin: No lesions in the area of chief complaint Neurologic: Sensation intact distally Psychiatric: Patient is competent for consent with normal mood and affect Lymphatic: No axillary or cervical lymphadenopathy  MUSCULOSKELETAL:  Left upper extremity:  Sarmiento brace is on.  He is neurovascularly intact.  Assessment: -Pathologic fracture of left midshaft humerus  Plan: -Our plan is for intramedullary nailing of the left pathologic humerus fracture.  We will plan to span the entire humerus to prevent future fractures given the multiple lytic lesions.  -He will be appropriate for discharge home from PACU postoperatively.  - The risks, benefits, and alternatives were discussed with the patient. There are risks associated with the surgery including, but not limited to, problems with anesthesia (death), infection, differences in leg length/angulation/rotation, fracture of bones, loosening or failure of implants, malunion, nonunion, hematoma (blood accumulation) which may require surgical drainage, blood clots, pulmonary embolism, nerve injury, and blood  vessel injury. The patient understands these risks and elects to proceed.    Nicholes Stairs, MD Cell [phone]    04/08/2019 7:18 AM

## 2019-04-08 NOTE — Brief Op Note (Signed)
04/08/2019  9:18 AM  PATIENT:  Danny Juarez  63 y.o. male  PRE-OPERATIVE DIAGNOSIS:  Left humerus pathologic fracture  POST-OPERATIVE DIAGNOSIS:  Left humerus pathologic fracture  PROCEDURE:  Procedure(s) with comments: INTRAMEDULLARY (IM) NAIL HUMERUS (Left) - 120 mins  SURGEON:  Surgeon(s) and Role:    * Stann Mainland, Elly Modena, MD - Primary  PHYSICIAN ASSISTANT:   ASSISTANTS: Katy Apo,  RNFA  ANESTHESIA:   regional and general  EBL:  50 mL   BLOOD ADMINISTERED:none  DRAINS: none   LOCAL MEDICATIONS USED:  NONE  SPECIMEN:  No Specimen  DISPOSITION OF SPECIMEN:  N/A  COUNTS:  YES  TOURNIQUET:  * No tourniquets in log *  DICTATION: .Note written in EPIC  PLAN OF CARE: Discharge to home after PACU  PATIENT DISPOSITION:  PACU - hemodynamically stable.   Delay start of Pharmacological VTE agent (>24hrs) due to surgical blood loss or risk of bleeding: not applicable

## 2019-04-08 NOTE — Discharge Instructions (Signed)
Orthopedic D/C instructions:  - Sling to left arm for comfort only.  You may remove and begin moving the left arm for activities of daily living when able and comfortable - maintain post op bandages until your follow up appointment with Dr. Stann Mainland, you may shower with these in place.  Should the bandages become soiled, remove and replace with daily dry dressings.  -apply ice to the left arm, for 20-30 minutes at a time for each hour that you are awake  - for pain use tylenol and or advil for mild to moderate pain, and take diluadid for severe pain.  - return to see Dr. Stann Mainland in 2 weeks for wound check

## 2019-04-08 NOTE — Op Note (Signed)
Date of Surgery: 04/08/2019  INDICATIONS: Mr. Novinger is a 63 y.o.-year-old male with a left humerus pathologic fracture secondary to multiple myeloma;  The patient did consent to the procedure after discussion of the risks and benefits.  PREOPERATIVE DIAGNOSIS: Left humerus pathologic fracture  POSTOPERATIVE DIAGNOSIS: Same.  PROCEDURE: 1. Left humerus treatment of fracture with intrmedullary nail  SURGEON: Geralynn Rile, M.D.  ASSIST: Katy Apo, RNFA.  ANESTHESIA:  general, and regional  IV FLUIDS AND URINE: See anesthesia.  ESTIMATED BLOOD LOSS: 50 mL.  IMPLANTS: Synthes humeral nail 7 mm x 255 mm 3 proximal interolock and 1 distal interlocking screw  DRAINS: none  COMPLICATIONS: None.  DESCRIPTION OF PROCEDURE: The patient was brought to the operating room and placed supine with a bump under the left scapula on the operating table.  The patient had been signed prior to the procedure and this was documented. The patient had the anesthesia placed by the anesthesiologist.  A time-out was performed to confirm that this was the correct patient, site, side and location. The patient did receive antibiotics prior to the incision and was re-dosed during the procedure as needed at indicated intervals.  A tourniquet not placed.  The patient had the operative extremity prepped and draped in the standard surgical fashion.     We began by approaching the proximal humerus via anterior lateral incision.  A 4 cm incision was made with care not to extend too far distal to protect the axillary nerve, along the anterior lateral process of the acromion.  Dissection was carried down through skin and subcutaneous tissue to the deltoid muscle.  The raphae was located between the anterior and middle deltoid.  This was split in line with the muscle fibers and the raphae.  Next to the deep deltoid fascia was opened.  The subdeltoid bursa was excised.  We then encountered rotator cuff tendon which was  intact.  A longitudinal incision was made in the lateral aspect of the anterior supraspinatus.  This exposed to the humeral head nicely.  We then used intraoperative fluoroscopy to identify the starting point for the humeral nail.  This was at the apex of the humeral head on both AP and lateral views for a center/center position in the humeral shaft.  Once that starting site was confirmed on both views we then moved for the opening reamer.  We opened to the proximal humerus to access the canal without reamer.  Next a reduction maneuver was performed through the midshaft fracture.  Once we had this aligned, we were able to pass the guide rod from proximal to distal.  We then measured off of the guide rod for the appropriate length of the nail.  We elected to move forward with a 255 mm nail.  We then passed the reamer to the distal aspect of the guide rod.  We did get chatter with the opening reamer.  Therefore we decided to use a 7 mm diameter nail.  Next we gently malleted the nail into place.  We noted that it was the appropriate length when assessing both distally and proximally.  Next using the outrigger guide we placed 3 proximal locking screws that were unicortical.  These had excellent purchase and subchondral bone of the humeral head.   We then turned our attention to the fracture again.  We utilized the cortical diameter and position of the distal segment to assess for correct rotation of the humerus given this was a transverse fracture.  Once we were  satisfied with that we moved to the fluoroscopy to the distal aspect of the nail where perfect circles were obtained.  Utilizing the perfect circle technique we placed a distal interlock screw from an anterior to posterior direction.  A skin incision was made overlying the distal interlock hole.  We then dissected down through the biceps and brachialis muscle with a tonsil to the level of bone.  We drilled through near and far cortex with the appropriate  drill bit.  Then we measured and placed a 4.5 mm x 28 mm distal interlocking screw.  This had good purchase.   We then performed intraoperative fluoroscopy to assure adequacy of the reduction and length of hardware.  We were satisfied with that.  We then moved to irrigate the wounds copiously with normal saline.  We then performed closure first of the proximal wounds.  The rotator cuff was closed with a #1 Vicryl in a side-to-side fashion.  Of note there was no avulsion or tear of the rotator cuff tendon from the greater tuberosity.  We then closed the deep deltoid fascia with an 0 Vicryl and separate figure-of-eight's.  Subcutaneous tissues were then closed with 2-0 Vicryl throughout the arm.  Staples were used to close the skin.  Standard sterile bandages were applied.   All counts were correct x2.  There were no noted intraoperative complications.  The patient was awakened from general anesthesia in stable condition.  Postoperatively in PACU he had adequate pain control with his preoperative interscalene block.    POSTOPERATIVE PLAN:  Dariyon will be in a sling for comfort only.  He may remove the sling and begin early range of motion of the elbow and shoulder.  He will be instructed to not lift over 2 pounds for the first 2 weeks.  He can begin showering while remaining in his postoperative bandages which will be removed at his first postoperative appointment as well as postoperative staple removal.  I will see him in 2 weeks.

## 2019-04-08 NOTE — Anesthesia Postprocedure Evaluation (Signed)
Anesthesia Post Note  Patient: Danny Juarez  Procedure(s) Performed: INTRAMEDULLARY (IM) NAIL HUMERUS (Left Arm Upper)     Patient location during evaluation: PACU Anesthesia Type: General Level of consciousness: awake and alert Pain management: pain level controlled Vital Signs Assessment: post-procedure vital signs reviewed and stable Respiratory status: spontaneous breathing, nonlabored ventilation and respiratory function stable Cardiovascular status: blood pressure returned to baseline and stable Postop Assessment: no apparent nausea or vomiting Anesthetic complications: no    Last Vitals:  Vitals:   04/08/19 0932 04/08/19 0951  BP: 113/65 107/63  Pulse: 68 73  Resp: 16 15  Temp: 36.6 C   SpO2: 100% 97%    Last Pain:  Vitals:   04/08/19 0932  TempSrc:   PainSc: Asleep                 Lidia Collum

## 2019-04-08 NOTE — Anesthesia Procedure Notes (Signed)
Anesthesia Regional Block: Interscalene brachial plexus block   Pre-Anesthetic Checklist: ,, timeout performed, Correct Patient, Correct Site, Correct Laterality, Correct Procedure, Correct Position, site marked, Risks and benefits discussed,  Surgical consent,  Pre-op evaluation,  At surgeon's request and post-op pain management  Laterality: Left  Prep: chloraprep       Needles:  Injection technique: Single-shot  Needle Type: Echogenic Stimulator Needle     Needle Length: 9cm  Needle Gauge: 21     Additional Needles:   Procedures:,,,, ultrasound used (permanent image in chart),,,,  Narrative:  Start time: 04/08/2019 7:05 AM End time: 04/08/2019 7:11 AM Injection made incrementally with aspirations every 5 mL.  Performed by: Personally  Anesthesiologist: Lidia Collum, MD  Additional Notes: Monitors applied. Injection made in 5cc increments. No resistance to injection. Good needle visualization. Patient tolerated procedure well.

## 2019-04-08 NOTE — Anesthesia Procedure Notes (Signed)
Procedure Name: Intubation Date/Time: 04/08/2019 7:37 AM Performed by: Alain Marion, CRNA Pre-anesthesia Checklist: Patient identified, Emergency Drugs available, Suction available and Patient being monitored Patient Re-evaluated:Patient Re-evaluated prior to induction Oxygen Delivery Method: Circle System Utilized Preoxygenation: Pre-oxygenation with 100% oxygen Induction Type: IV induction and Rapid sequence Laryngoscope Size: Miller and 2 Grade View: Grade I Tube type: Oral Tube size: 7.5 mm Number of attempts: 1 Airway Equipment and Method: Stylet and Oral airway Placement Confirmation: ETT inserted through vocal cords under direct vision,  positive ETCO2 and breath sounds checked- equal and bilateral Secured at: 22 cm Tube secured with: Tape Dental Injury: Teeth and Oropharynx as per pre-operative assessment

## 2019-04-10 ENCOUNTER — Inpatient Hospital Stay: Payer: 59

## 2019-04-10 ENCOUNTER — Other Ambulatory Visit: Payer: Self-pay

## 2019-04-10 ENCOUNTER — Other Ambulatory Visit: Payer: Self-pay | Admitting: *Deleted

## 2019-04-10 ENCOUNTER — Encounter (HOSPITAL_COMMUNITY): Payer: Self-pay | Admitting: Orthopedic Surgery

## 2019-04-10 VITALS — BP 104/53 | HR 65 | Temp 97.8°F | Resp 16

## 2019-04-10 DIAGNOSIS — Z5112 Encounter for antineoplastic immunotherapy: Secondary | ICD-10-CM | POA: Diagnosis not present

## 2019-04-10 DIAGNOSIS — C9000 Multiple myeloma not having achieved remission: Secondary | ICD-10-CM

## 2019-04-10 LAB — CMP (CANCER CENTER ONLY)
ALT: 19 U/L (ref 0–44)
AST: 22 U/L (ref 15–41)
Albumin: 3.5 g/dL (ref 3.5–5.0)
Alkaline Phosphatase: 80 U/L (ref 38–126)
Anion gap: 6 (ref 5–15)
BUN: 16 mg/dL (ref 8–23)
CO2: 27 mmol/L (ref 22–32)
Calcium: 8 mg/dL — ABNORMAL LOW (ref 8.9–10.3)
Chloride: 100 mmol/L (ref 98–111)
Creatinine: 0.68 mg/dL (ref 0.61–1.24)
GFR, Est AFR Am: >60 mL/min (ref >60)
GFR, Estimated: >60 mL/min (ref >60)
Glucose, Bld: 124 mg/dL — ABNORMAL HIGH (ref 70–99)
Potassium: 4.1 mmol/L (ref 3.5–5.1)
Sodium: 133 mmol/L — ABNORMAL LOW (ref 135–145)
Total Bilirubin: 0.5 mg/dL (ref 0.3–1.2)
Total Protein: 6.3 g/dL — ABNORMAL LOW (ref 6.5–8.1)

## 2019-04-10 LAB — CBC WITH DIFFERENTIAL (CANCER CENTER ONLY)
Abs Immature Granulocytes: 0.01 10*3/uL (ref 0.00–0.07)
Basophils Absolute: 0 10*3/uL (ref 0.0–0.1)
Basophils Relative: 0 %
Eosinophils Absolute: 0.1 10*3/uL (ref 0.0–0.5)
Eosinophils Relative: 2 %
HCT: 30.5 % — ABNORMAL LOW (ref 39.0–52.0)
Hemoglobin: 10.3 g/dL — ABNORMAL LOW (ref 13.0–17.0)
Immature Granulocytes: 0 %
Lymphocytes Relative: 7 %
Lymphs Abs: 0.2 10*3/uL — ABNORMAL LOW (ref 0.7–4.0)
MCH: 31.5 pg (ref 26.0–34.0)
MCHC: 33.8 g/dL (ref 30.0–36.0)
MCV: 93.3 fL (ref 80.0–100.0)
Monocytes Absolute: 0.2 10*3/uL (ref 0.1–1.0)
Monocytes Relative: 6 %
Neutro Abs: 2.8 10*3/uL (ref 1.7–7.7)
Neutrophils Relative %: 85 %
Platelet Count: 161 10*3/uL (ref 150–400)
RBC: 3.27 MIL/uL — ABNORMAL LOW (ref 4.22–5.81)
RDW: 12.7 % (ref 11.5–15.5)
WBC Count: 3.3 10*3/uL — ABNORMAL LOW (ref 4.0–10.5)
nRBC: 0 % (ref 0.0–0.2)

## 2019-04-10 MED ORDER — PROCHLORPERAZINE MALEATE 10 MG PO TABS
ORAL_TABLET | ORAL | Status: AC
  Filled 2019-04-10: qty 1

## 2019-04-10 MED ORDER — LENALIDOMIDE 20 MG PO CAPS: ORAL_CAPSULE | ORAL | 0 refills | Status: DC

## 2019-04-10 MED ORDER — BORTEZOMIB CHEMO SQ INJECTION 3.5 MG (2.5MG/ML)
1.3000 mg/m2 | Freq: Once | INTRAMUSCULAR | Status: AC
  Administered 2019-04-10: 2.5 mg via SUBCUTANEOUS
  Filled 2019-04-10: qty 1

## 2019-04-10 NOTE — Patient Instructions (Signed)

## 2019-04-10 NOTE — Telephone Encounter (Signed)
Rx for revlimid sent to [facility]

## 2019-04-17 ENCOUNTER — Other Ambulatory Visit: Payer: Self-pay

## 2019-04-17 ENCOUNTER — Inpatient Hospital Stay: Payer: 59

## 2019-04-17 VITALS — BP 100/53 | HR 62 | Temp 97.6°F | Resp 18

## 2019-04-17 DIAGNOSIS — Z5112 Encounter for antineoplastic immunotherapy: Secondary | ICD-10-CM | POA: Diagnosis not present

## 2019-04-17 DIAGNOSIS — C9000 Multiple myeloma not having achieved remission: Secondary | ICD-10-CM

## 2019-04-17 LAB — CBC WITH DIFFERENTIAL (CANCER CENTER ONLY)
Abs Immature Granulocytes: 0.02 10*3/uL (ref 0.00–0.07)
Basophils Absolute: 0 10*3/uL (ref 0.0–0.1)
Basophils Relative: 1 %
Eosinophils Absolute: 0.1 10*3/uL (ref 0.0–0.5)
Eosinophils Relative: 2 %
HCT: 34 % — ABNORMAL LOW (ref 39.0–52.0)
Hemoglobin: 11.4 g/dL — ABNORMAL LOW (ref 13.0–17.0)
Immature Granulocytes: 1 %
Lymphocytes Relative: 9 %
Lymphs Abs: 0.3 10*3/uL — ABNORMAL LOW (ref 0.7–4.0)
MCH: 31.1 pg (ref 26.0–34.0)
MCHC: 33.5 g/dL (ref 30.0–36.0)
MCV: 92.9 fL (ref 80.0–100.0)
Monocytes Absolute: 0.2 10*3/uL (ref 0.1–1.0)
Monocytes Relative: 8 %
Neutro Abs: 2.5 10*3/uL (ref 1.7–7.7)
Neutrophils Relative %: 79 %
Platelet Count: 191 10*3/uL (ref 150–400)
RBC: 3.66 MIL/uL — ABNORMAL LOW (ref 4.22–5.81)
RDW: 12.9 % (ref 11.5–15.5)
WBC Count: 3.2 10*3/uL — ABNORMAL LOW (ref 4.0–10.5)
nRBC: 0 % (ref 0.0–0.2)

## 2019-04-17 LAB — CMP (CANCER CENTER ONLY)
ALT: 18 U/L (ref 0–44)
AST: 15 U/L (ref 15–41)
Albumin: 3.7 g/dL (ref 3.5–5.0)
Alkaline Phosphatase: 85 U/L (ref 38–126)
Anion gap: 5 (ref 5–15)
BUN: 20 mg/dL (ref 8–23)
CO2: 28 mmol/L (ref 22–32)
Calcium: 8.5 mg/dL — ABNORMAL LOW (ref 8.9–10.3)
Chloride: 104 mmol/L (ref 98–111)
Creatinine: 0.79 mg/dL (ref 0.61–1.24)
GFR, Est AFR Am: >60 mL/min (ref >60)
GFR, Estimated: >60 mL/min (ref >60)
Glucose, Bld: 107 mg/dL — ABNORMAL HIGH (ref 70–99)
Potassium: 4.2 mmol/L (ref 3.5–5.1)
Sodium: 137 mmol/L (ref 135–145)
Total Bilirubin: 0.5 mg/dL (ref 0.3–1.2)
Total Protein: 7.3 g/dL (ref 6.5–8.1)

## 2019-04-17 MED ORDER — PROCHLORPERAZINE MALEATE 10 MG PO TABS
ORAL_TABLET | ORAL | Status: AC
  Filled 2019-04-17: qty 1

## 2019-04-17 MED ORDER — BORTEZOMIB CHEMO SQ INJECTION 3.5 MG (2.5MG/ML)
1.3000 mg/m2 | Freq: Once | INTRAMUSCULAR | Status: AC
  Administered 2019-04-17: 2.5 mg via SUBCUTANEOUS
  Filled 2019-04-17: qty 1

## 2019-04-17 MED ORDER — PROCHLORPERAZINE MALEATE 10 MG PO TABS: 10.0000 mg | ORAL_TABLET | Freq: Once | ORAL | Status: DC

## 2019-04-17 NOTE — Patient Instructions (Signed)

## 2019-04-18 LAB — MULTIPLE MYELOMA PANEL, SERUM
Albumin SerPl Elph-Mcnc: 3 g/dL (ref 2.9–4.4)
Albumin/Glob SerPl: 1 (ref 0.7–1.7)
Alpha 1: 0.3 g/dL (ref 0.0–0.4)
Alpha2 Glob SerPl Elph-Mcnc: 0.7 g/dL (ref 0.4–1.0)
B-Globulin SerPl Elph-Mcnc: 0.9 g/dL (ref 0.7–1.3)
Gamma Glob SerPl Elph-Mcnc: 1.4 g/dL (ref 0.4–1.8)
Globulin, Total: 3.3 g/dL (ref 2.2–3.9)
IgA: 17 mg/dL — ABNORMAL LOW (ref 61–437)
IgG (Immunoglobin G), Serum: 1844 mg/dL — ABNORMAL HIGH (ref 603–1613)
IgM (Immunoglobulin M), Srm: 26 mg/dL (ref 20–172)
M Protein SerPl Elph-Mcnc: 1.1 g/dL — ABNORMAL HIGH
Total Protein ELP: 6.3 g/dL (ref 6.0–8.5)

## 2019-04-18 LAB — KAPPA/LAMBDA LIGHT CHAINS
Kappa free light chain: 15.7 mg/L (ref 3.3–19.4)
Kappa, lambda light chain ratio: 2.28 — ABNORMAL HIGH (ref 0.26–1.65)
Lambda free light chains: 6.9 mg/L (ref 5.7–26.3)

## 2019-04-21 DIAGNOSIS — Z4789 Encounter for other orthopedic aftercare: Secondary | ICD-10-CM | POA: Insufficient documentation

## 2019-04-24 ENCOUNTER — Other Ambulatory Visit: Payer: Self-pay

## 2019-04-24 ENCOUNTER — Telehealth: Payer: Self-pay | Admitting: Hematology

## 2019-04-24 ENCOUNTER — Inpatient Hospital Stay: Payer: 59

## 2019-04-24 ENCOUNTER — Inpatient Hospital Stay (HOSPITAL_BASED_OUTPATIENT_CLINIC_OR_DEPARTMENT_OTHER): Payer: 59 | Admitting: Hematology

## 2019-04-24 ENCOUNTER — Inpatient Hospital Stay: Payer: 59 | Attending: Hematology

## 2019-04-24 ENCOUNTER — Encounter: Payer: Self-pay | Admitting: Hematology

## 2019-04-24 VITALS — BP 112/61 | HR 63 | Temp 98.4°F | Resp 18 | Ht 69.0 in | Wt 158.8 lb

## 2019-04-24 DIAGNOSIS — T451X5A Adverse effect of antineoplastic and immunosuppressive drugs, initial encounter: Secondary | ICD-10-CM

## 2019-04-24 DIAGNOSIS — D6481 Anemia due to antineoplastic chemotherapy: Secondary | ICD-10-CM

## 2019-04-24 DIAGNOSIS — C9000 Multiple myeloma not having achieved remission: Secondary | ICD-10-CM | POA: Insufficient documentation

## 2019-04-24 DIAGNOSIS — M84422A Pathological fracture, left humerus, initial encounter for fracture: Secondary | ICD-10-CM

## 2019-04-24 DIAGNOSIS — Z5112 Encounter for antineoplastic immunotherapy: Secondary | ICD-10-CM | POA: Diagnosis not present

## 2019-04-24 DIAGNOSIS — D701 Agranulocytosis secondary to cancer chemotherapy: Secondary | ICD-10-CM

## 2019-04-24 LAB — CBC WITH DIFFERENTIAL (CANCER CENTER ONLY)
Abs Immature Granulocytes: 0.01 10*3/uL (ref 0.00–0.07)
Basophils Absolute: 0 10*3/uL (ref 0.0–0.1)
Basophils Relative: 1 %
Eosinophils Absolute: 0 10*3/uL (ref 0.0–0.5)
Eosinophils Relative: 0 %
HCT: 36.8 % — ABNORMAL LOW (ref 39.0–52.0)
Hemoglobin: 12.2 g/dL — ABNORMAL LOW (ref 13.0–17.0)
Immature Granulocytes: 1 %
Lymphocytes Relative: 15 %
Lymphs Abs: 0.3 10*3/uL — ABNORMAL LOW (ref 0.7–4.0)
MCH: 31.1 pg (ref 26.0–34.0)
MCHC: 33.2 g/dL (ref 30.0–36.0)
MCV: 93.9 fL (ref 80.0–100.0)
Monocytes Absolute: 0.1 10*3/uL (ref 0.1–1.0)
Monocytes Relative: 4 %
Neutro Abs: 1.6 10*3/uL — ABNORMAL LOW (ref 1.7–7.7)
Neutrophils Relative %: 79 %
Platelet Count: 164 10*3/uL (ref 150–400)
RBC: 3.92 MIL/uL — ABNORMAL LOW (ref 4.22–5.81)
RDW: 13.3 % (ref 11.5–15.5)
WBC Count: 2 10*3/uL — ABNORMAL LOW (ref 4.0–10.5)
nRBC: 0 % (ref 0.0–0.2)

## 2019-04-24 LAB — CMP (CANCER CENTER ONLY)
ALT: 19 U/L (ref 0–44)
AST: 19 U/L (ref 15–41)
Albumin: 3.8 g/dL (ref 3.5–5.0)
Alkaline Phosphatase: 90 U/L (ref 38–126)
Anion gap: 5 (ref 5–15)
BUN: 27 mg/dL — ABNORMAL HIGH (ref 8–23)
CO2: 28 mmol/L (ref 22–32)
Calcium: 8.9 mg/dL (ref 8.9–10.3)
Chloride: 106 mmol/L (ref 98–111)
Creatinine: 0.89 mg/dL (ref 0.61–1.24)
GFR, Est AFR Am: >60 mL/min (ref >60)
GFR, Estimated: >60 mL/min (ref >60)
Glucose, Bld: 125 mg/dL — ABNORMAL HIGH (ref 70–99)
Potassium: 4.8 mmol/L (ref 3.5–5.1)
Sodium: 139 mmol/L (ref 135–145)
Total Bilirubin: 0.5 mg/dL (ref 0.3–1.2)
Total Protein: 7.4 g/dL (ref 6.5–8.1)

## 2019-04-24 MED ORDER — BORTEZOMIB CHEMO SQ INJECTION 3.5 MG (2.5MG/ML)
1.3000 mg/m2 | Freq: Once | INTRAMUSCULAR | Status: AC
  Administered 2019-04-24: 2.5 mg via SUBCUTANEOUS
  Filled 2019-04-24: qty 1

## 2019-04-24 MED ORDER — PROCHLORPERAZINE MALEATE 10 MG PO TABS: 10.0000 mg | ORAL_TABLET | Freq: Once | ORAL | Status: DC

## 2019-04-24 NOTE — Patient Instructions (Signed)

## 2019-04-24 NOTE — Telephone Encounter (Signed)
Appointments scheduled calendar printed per 6/4 los 

## 2019-04-24 NOTE — Progress Notes (Signed)
Danny Juarez OFFICE PROGRESS NOTE  Patient Care Team: Patient, No Pcp Per as PCP - General (General Practice) Cordelia Poche, RN as Oncology Nurse Navigator Danny Men, MD as Medical Oncologist (Hematology)  HEME/ONC OVERVIEW: 1. 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 and DS  -10/2018: MRI of the right shoulder for arm pain showed innumerable enhancing lesions throughout the right humerus, ribs and right scapula; no fractures -11/2018: baseline labs  Hgb 10, Ca 12.2, Cr 2.89, albumin 2.9  M-spike 3.7 g/dL, free lambda 7.8, monoclonal IgG lambda on IFE, quant IgG 5602, LDH 180, B2M 6.8  PET showed innumerable lytic lesions involving the axial and proximal appendicular skeleton and calvarium; no plasmacytoma   BM bx: normocellular marrow with plasma cell neoplasm (~64% of all cells); myeloma FISH showed trisomy 11 and gain of ATM gene (standard risk) -12/2018 - present: q21d RVd, currently PR   2. 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  -S/p Xgeva x 1 for Ca 12.2; Zometa held due to multiple dental procedures -02/2019: pathologic left humerus fracture -03/2019 - present: q40monthZometa   TREATMENT REGIMEN:  01/09/2019 - present: q21day RVd; Revlimid dose reduced to 213mdaily due to fluctuating renal function  -C1 (2/20), C2 (3/12), C3 (4/2), C4 (4/23), C5 (5/14), C6 (6/4)   04/03/2019 - present: q3m44monthmeta; cleared by dentistry  04/08/2019: IMNP for left humerus fracture   ASSESSMENT & PLAN:   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 and DS  -S/p 5 cycles of RVd -MM labs after 5 cycles of treatment showed M-spike 1.1g/dL, consistent w/ PR  -[facility] have reached out to Dr. 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  -S/p intramedullary nail placement for pathologic left humerus fracture on 04/08/2019 -q3mo9montheta resumed in 03/2019 after dental clearnce  -On Ca-Vit D supplement BID   Chemotherapy-associated leukopenia -Secondary to chemotherapy -WBC 2.0k with ANC 1600, slightly lower than last week  -Patient denies any symptoms of infection -We will monitor for now; no indication for dose adjustment -If leukopenia worsens in the future, we will consider delaying chemotherapy or adjusting chemotherapy dose  Chemotherapy-associated anemia -Secondary to chemotherapy -Hgb 12.2, stable -Patient denies any symptom of bleeding -We will monitor for now; no indication for dose adjustment  No orders of the defined types were placed in this encounter.  All questions were answered. The patient knows to call the clinic with any problems, questions or concerns. No barriers to learning was detected.  Return on 05/08/2019 (~C6D15 of RVd) for labs, injection and clinic appt.  [name] Danny Juarez 04/24/2019 1:07 PM  CHIEF COMPLAINT: "I am feeling fine"  INTERVAL HISTORY: Danny Juarez to clinic for follow-up of multiple myeloma on RVd.  He had intramedullary nail placement for left humerus fracture on 04/08/2019.  He reports that since the surgery, he has been able to regain some mobility, but he remains nonweightbearing for at least 2 more weeks.  He has been going to physical therapy twice a week.  He denies any significant pain in the left arm.  He is compliant with his calcium-vitamin D supplement.  He is otherwise tolerating RVD well without significant side effects.  He denies any other complaint today.  SUMMARY OF ONCOLOGIC HISTORY:   Multiple myeloma ([place])   11/11/2018 Imaging    MRI right shoulder for arm pain showed innumerable  enhancing lesions throughout the right humerus, ribs and right scapula; no fractures    11/29/2018 Miscellaneous    Baseline labs -Hgb 10, Ca 12.2, Cr 2.89, albumin 2.9 -M-spike 3.7 g/dL, free lambda 7.8, monoclonal IgG lambda on IFE, quant IgG 5602, LDH 180, B2M 6.8    01/09/2019 -  Chemotherapy    The patient had bortezomib SQ (VELCADE) chemo injection 2.5 mg, 1.3 mg/m2 = 2.5 mg, Subcutaneous,  Once, 6 of 10 cycles Administration: 2.5 mg (01/09/2019), 2.5 mg (01/16/2019), 2.5 mg (01/23/2019), 2.5 mg (01/30/2019), 2.5 mg (02/06/2019), 2.5 mg (02/13/2019), 2.5 mg (02/20/2019), 2.5 mg (02/27/2019), 2.5 mg (03/06/2019), 2.5 mg (03/13/2019), 2.5 mg (03/20/2019), 2.5 mg (03/27/2019), 2.5 mg (04/03/2019), 2.5 mg (04/10/2019), 2.5 mg (04/17/2019)  for chemotherapy treatment.      REVIEW OF SYSTEMS:   Constitutional: ( - ) fevers, ( - )  chills , ( - ) night sweats Eyes: ( - ) blurriness of vision, ( - ) double vision, ( - ) watery eyes Ears, nose, mouth, throat, and face: ( - ) mucositis, ( - ) sore throat Respiratory: ( - ) cough, ( - ) dyspnea, ( - ) wheezes Cardiovascular: ( - ) palpitation, ( - ) chest discomfort, ( - ) lower extremity swelling Gastrointestinal:  ( - ) nausea, ( - ) heartburn, ( - ) change in bowel habits Skin: ( - ) abnormal skin rashes Lymphatics: ( - ) new lymphadenopathy, ( - ) easy bruising Neurological: ( - ) numbness, ( - ) tingling, ( - ) new weaknesses Behavioral/Psych: ( - ) mood change, ( - ) new changes  All other systems were reviewed with the patient and are negative.  I have reviewed the past medical history, past surgical history, social history and family history with the patient and they are unchanged from previous note.  ALLERGIES:  has No Known Allergies.  MEDICATIONS:  Current Outpatient Medications  Medication Sig Dispense Refill  . acyclovir (ZOVIRAX) 400 MG tablet Take 1 tablet (400 mg total) by mouth 2 (two) times daily. (Patient taking differently: Take 400 mg by  mouth 2 (two) times daily as needed (fever blisters). ) 60 tablet 3  . aspirin EC 81 MG tablet Take 81 mg by mouth daily.    Danny Juarez CALCIUM-VITAMIN D PO Take 1 tablet by mouth 2 (two) times a day.    Danny Juarez dexamethasone (DECADRON) 4 MG tablet Take 10 tablets (40 mg) on days 1, 8, and 15 of chemo. Repeat every 21 days. 30 tablet 3  . ferrous sulfate 325 (65 FE) MG EC tablet Take 325 mg by mouth 3 (three) times daily with meals.    Danny Juarez HYDROcodone-acetaminophen (NORCO/VICODIN) 5-325 MG tablet Take 1-2 tablets by mouth every 4 (four) hours as needed. 8 tablet 0  . lenalidomide (REVLIMID) 20 MG capsule TAKE 1 CAPSULE BY MOUTH  DAILY. SWALLOW WHOLE DO NOT BREAK, CRUSH, OR CHEW  CAPSULE. 14 capsule 0  . lidocaine-prilocaine (EMLA) cream Apply to affected area once 30 g 3  . LORazepam (ATIVAN) 0.5 MG tablet Take 1 tablet (0.5 mg total) by mouth every 6 (six) hours as needed (Nausea or vomiting). 30 tablet 0  . morphine (MSIR) 15 MG tablet Take 1 tablet (15 mg total) by  mouth every 4 (four) hours as needed for severe pain. 60 tablet 0  . Multiple Vitamin (MULTIVITAMIN WITH MINERALS) TABS Take 1 tablet by mouth daily.    . ondansetron (ZOFRAN ODT) 4 MG disintegrating tablet Take 1 tablet (4 mg total) by mouth every 8 (eight) hours as needed. 20 tablet 0  . ondansetron (ZOFRAN) 8 MG tablet Take 1 tablet (8 mg total) by mouth 2 (two) times daily as needed (Nausea or vomiting). 30 tablet 1  . prochlorperazine (COMPAZINE) 10 MG tablet Take 1 tablet (10 mg total) by mouth every 6 (six) hours as needed (Nausea or vomiting). 30 tablet 1   No current facility-administered medications for this visit.    Facility-Administered Medications Ordered in Other Visits  Medication Dose Route Frequency Provider Last Rate Last Dose  . bortezomib SQ (VELCADE) chemo injection 2.5 mg  1.3 mg/m2 (Treatment Plan Recorded) Subcutaneous Once Danny Men, MD      . prochlorperazine (COMPAZINE) tablet 10 mg  10 mg Oral Once Danny Men, MD         PHYSICAL EXAMINATION: ECOG PERFORMANCE STATUS: 1 - Symptomatic but completely ambulatory  Today's Vitals   04/24/19 1210  BP: 112/61  Pulse: 63  Resp: 18  Temp: 98.4 F (36.9 C)  TempSrc: Oral  SpO2: 100%  Weight: 158 lb 12.8 oz (72 kg)  Height: '5\' 9"'  (1.753 m)  PainSc: 0-No pain   Body mass index is 23.45 kg/m.  Filed Weights   04/24/19 1210  Weight: 158 lb 12.8 oz (72 kg)    GENERAL: alert, no distress and comfortable SKIN: skin color, texture, turgor are normal, no rashes or significant lesions EYES: conjunctiva are pink and non-injected, sclera clear OROPHARYNX: no exudate, no erythema; lips, buccal mucosa, and tongue normal  NECK: supple, non-tender LUNGS: clear to auscultation with normal breathing effort HEART: regular rate & rhythm and no murmurs and no lower extremity edema ABDOMEN: soft, non-tender, non-distended, normal bowel sounds Musculoskeletal: mild limited ROM in the left arm, no significant tenderness  PSYCH: alert & oriented x 3, fluent speech NEURO: no focal motor/sensory deficits  LABORATORY DATA:  I have reviewed the data as listed    Component Value Date/Time   NA 139 04/24/2019 1133   K 4.8 04/24/2019 1133   CL 106 04/24/2019 1133   CO2 28 04/24/2019 1133   GLUCOSE 125 (H) 04/24/2019 1133   BUN 27 (H) 04/24/2019 1133   CREATININE 0.89 04/24/2019 1133   CALCIUM 8.9 04/24/2019 1133   PROT 7.4 04/24/2019 1133   ALBUMIN 3.8 04/24/2019 1133   AST 19 04/24/2019 1133   ALT 19 04/24/2019 1133   ALKPHOS 90 04/24/2019 1133   BILITOT 0.5 04/24/2019 1133   GFRNONAA >60 04/24/2019 1133   GFRAA >60 04/24/2019 1133    No results found for: SPEP, UPEP  Lab Results  Component Value Date   WBC 2.0 (L) 04/24/2019   NEUTROABS 1.6 (L) 04/24/2019   HGB 12.2 (L) 04/24/2019   HCT 36.8 (L) 04/24/2019   MCV 93.9 04/24/2019   PLT 164 04/24/2019      Chemistry      Component Value Date/Time   NA 139 04/24/2019 1133   K 4.8 04/24/2019 1133    CL 106 04/24/2019 1133   CO2 28 04/24/2019 1133   BUN 27 (H) 04/24/2019 1133   CREATININE 0.89 04/24/2019 1133      Component Value Date/Time   CALCIUM 8.9 04/24/2019 1133   ALKPHOS 90 04/24/2019 1133  AST 19 04/24/2019 1133   ALT 19 04/24/2019 1133   BILITOT 0.5 04/24/2019 1133       RADIOGRAPHIC STUDIES: I have personally reviewed the radiological images as listed below and agreed with the findings in the report. Dg Humerus Left  Result Date: 04/08/2019 CLINICAL DATA:  Left humeral fracture EXAM: LEFT HUMERUS - 2+ VIEW COMPARISON:  03/19/2019 FLUOROSCOPY TIME:  Radiation Exposure Index (as provided by the fluoroscopic device): Not available If the device does not provide the exposure index: Fluoroscopy Time:  2 minutes 30 seconds Number of Acquired Images:  7 FINDINGS: Medullary rod is noted within the humerus with proximal and distal fixation screws. The fracture fragments are in near anatomic alignment. Underlying scalping consistent with multiple myeloma is again noted. IMPRESSION: ORIF of left humeral fracture. Electronically Signed   By: Inez Catalina M.D.   On: 04/08/2019 12:16   Dg C-arm 1-60 Min  Result Date: 04/08/2019 CLINICAL DATA:  Intramedullary nail placement in the left proximal humerus. EXAM: DG C-ARM 61-120 MIN COMPARISON:  None. FLUOROSCOPY TIME:  2 minutes 30 seconds FINDINGS: Numerous lytic bone lesions throughout the left humerus with a pathologic fracture through the mid humeral diaphysis transfixed with a intramedullary nail and interlocking screws. IMPRESSION: Numerous lytic bone lesions throughout the left humerus with a pathologic fracture through the mid humeral diaphysis transfixed with a intramedullary nail and interlocking screws. Electronically Signed   By: Kathreen Devoid   On: 04/08/2019 12:14

## 2019-04-29 DIAGNOSIS — M25612 Stiffness of left shoulder, not elsewhere classified: Secondary | ICD-10-CM | POA: Insufficient documentation

## 2019-05-01 ENCOUNTER — Inpatient Hospital Stay: Payer: 59

## 2019-05-01 ENCOUNTER — Other Ambulatory Visit: Payer: Self-pay

## 2019-05-01 VITALS — BP 104/59 | HR 60 | Temp 97.8°F | Resp 18

## 2019-05-01 DIAGNOSIS — C9000 Multiple myeloma not having achieved remission: Secondary | ICD-10-CM

## 2019-05-01 DIAGNOSIS — Z5112 Encounter for antineoplastic immunotherapy: Secondary | ICD-10-CM | POA: Diagnosis not present

## 2019-05-01 LAB — CBC WITH DIFFERENTIAL (CANCER CENTER ONLY)
Abs Immature Granulocytes: 0.01 10*3/uL (ref 0.00–0.07)
Basophils Absolute: 0 10*3/uL (ref 0.0–0.1)
Basophils Relative: 0 %
Eosinophils Absolute: 0.1 10*3/uL (ref 0.0–0.5)
Eosinophils Relative: 5 %
HCT: 36 % — ABNORMAL LOW (ref 39.0–52.0)
Hemoglobin: 12.2 g/dL — ABNORMAL LOW (ref 13.0–17.0)
Immature Granulocytes: 0 %
Lymphocytes Relative: 27 %
Lymphs Abs: 0.7 10*3/uL (ref 0.7–4.0)
MCH: 31.5 pg (ref 26.0–34.0)
MCHC: 33.9 g/dL (ref 30.0–36.0)
MCV: 93 fL (ref 80.0–100.0)
Monocytes Absolute: 0.2 10*3/uL (ref 0.1–1.0)
Monocytes Relative: 9 %
Neutro Abs: 1.6 10*3/uL — ABNORMAL LOW (ref 1.7–7.7)
Neutrophils Relative %: 59 %
Platelet Count: 174 10*3/uL (ref 150–400)
RBC: 3.87 MIL/uL — ABNORMAL LOW (ref 4.22–5.81)
RDW: 13.8 % (ref 11.5–15.5)
WBC Count: 2.7 10*3/uL — ABNORMAL LOW (ref 4.0–10.5)
nRBC: 0 % (ref 0.0–0.2)

## 2019-05-01 LAB — CMP (CANCER CENTER ONLY)
ALT: 16 U/L (ref 0–44)
AST: 18 U/L (ref 15–41)
Albumin: 3.7 g/dL (ref 3.5–5.0)
Alkaline Phosphatase: 75 U/L (ref 38–126)
Anion gap: 5 (ref 5–15)
BUN: 16 mg/dL (ref 8–23)
CO2: 29 mmol/L (ref 22–32)
Calcium: 9.1 mg/dL (ref 8.9–10.3)
Chloride: 104 mmol/L (ref 98–111)
Creatinine: 0.82 mg/dL (ref 0.61–1.24)
GFR, Est AFR Am: >60 mL/min (ref >60)
GFR, Estimated: >60 mL/min (ref >60)
Glucose, Bld: 94 mg/dL (ref 70–99)
Potassium: 4.1 mmol/L (ref 3.5–5.1)
Sodium: 138 mmol/L (ref 135–145)
Total Bilirubin: 0.7 mg/dL (ref 0.3–1.2)
Total Protein: 6.8 g/dL (ref 6.5–8.1)

## 2019-05-01 MED ORDER — BORTEZOMIB CHEMO SQ INJECTION 3.5 MG (2.5MG/ML)
1.3000 mg/m2 | Freq: Once | INTRAMUSCULAR | Status: AC
  Administered 2019-05-01: 2.5 mg via SUBCUTANEOUS
  Filled 2019-05-01: qty 1

## 2019-05-01 NOTE — Patient Instructions (Signed)

## 2019-05-05 NOTE — Progress Notes (Signed)
[name] OFFICE PROGRESS NOTE  Patient Care Team: Patient, No Pcp Per as PCP - General (General Practice) Cordelia Poche, RN as Oncology Nurse Navigator [name], MD as Medical Oncologist (Hematology)  HEME/ONC OVERVIEW: 1. 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 and DS  -10/2018: MRI of the right shoulder for arm pain showed innumerable enhancing lesions throughout the right humerus, ribs and right scapula; no fractures -11/2018: baseline labs  Hgb 10, Ca 12.2, Cr 2.89, albumin 2.9  M-spike 3.7 g/dL, free lambda 7.8, monoclonal IgG lambda on IFE, quant IgG 5602, LDH 180, B2M 6.8  PET showed innumerable lytic lesions involving the axial and proximal appendicular skeleton and calvarium; no plasmacytoma   BM bx: normocellular marrow with plasma cell neoplasm (~64% of all cells); myeloma FISH showed trisomy 11 and gain of ATM gene (standard risk) -12/2018 - present: q21d RVd, currently PR   2. 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  -S/p Xgeva x 1 for Ca 12.2; Zometa held due to multiple dental procedures -02/2019: pathologic left humerus fracture -03/2019 - present: q55monthZometa   TREATMENT REGIMEN:  01/09/2019 - present: q21day RVd; Revlimid dose reduced to 28mdaily due to fluctuating renal function  -C1 (2/20), C2 (3/12), C3 (4/2), C4 (4/23), C5 (5/14), C6 (6/4), C7 (6/25)  04/03/2019 - present: q3m93monthmeta; cleared by dentistry  04/08/2019: IMNP for left humerus fracture   ASSESSMENT & PLAN:   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 and DS  -S/p 6 cycles of RVd -Labs consistent with PR after 5 cycles of treatment -I have discussed the case with [facility] to complete the last treatment on 05/08/2019 (ie today), after which he [facility] is outlined below:  -6/25: pre-BMT  evaluation  -7/9: mobilization  -7/20: conditioning chemotherapy start date  -7/21: transplant date  -8/2: approximate transplant discharge date  -Labs adequate today, proceed with C6D15 of RVd (and the last tx) -[facility] ~Day 60 post-[facility]plant, after which he will resume follow-up with [place] [facility]   -VZV prophylaxis: acyclovir -PRN anti-emetics; Zofran and Compazine   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  -S/p intramedullary nail placement for pathologic left humerus fracture on 04/08/2019 -q3mo76montheta resumed in 03/2019 after dental clearnce  -Next dose due in 06/2019  -On Ca-Vit D supplement   Hyperkalemia -K 5.4 today, patient is asymptomatic -I have prescribed Kayexalate 15g daily x 2 days -We will repeat labs next week to monitor K level  Hypercalcemia -Ca 10.4 today -Patient is currently taking Ca-Vit D supplement 1 tab BID -I have instructed the patient to reduce it to 1 tab daily, as well as maintaining adequate hydration  -Repeat labs as above next week to monitor for any change   Right knee swelling -Likely due to osteoarthritis -[facility]; no evidence of infection -On exam, patient has no evidence of right lower extremity pain or swelling to suggest DVT  -Continue follow-up with orthopedic surgery  Orders Placed This Encounter  Procedures  . Basic Metabolic Panel - [facility]    Standing Status:   Future    Standing Expiration Date:   06/11/2020   All questions were answered. The patient knows to call the clinic with any problems, questions  or concerns. No barriers to learning was detected.  Return in 1 week for lab appt only. Return in mid-07/2019 after bone marrow transplant to discuss post-transplant treatment plan.   [name], MD 05/08/2019 12:34 PM  CHIEF COMPLAINT: "My right knee was a little sore"  INTERVAL  HISTORY: Danny Juarez returns to clinic for follow-up of multiple myeloma on RVD.  Patient reports that starting this past Saturday, he noticed a small area of swelling just above the right kneecap, with some associated soreness.  He denied any pain or swelling in the muscles or bones in the right lower extremity.  He was evaluated by Dr. Lynnette Caffey of [facility], and underwent arthrocentesis that removed some clear fluid.  Dr. Alma Friendly did not think it was an infection.  Since the procedure, the right knee soreness has improved, and he denies any recurrent swelling.  He otherwise feels well today, and denies any other complaint.  SUMMARY OF ONCOLOGIC HISTORY: Oncology History  Multiple myeloma ([place])  11/11/2018 Imaging   MRI right shoulder for arm pain showed innumerable enhancing lesions throughout the right humerus, ribs and right scapula; no fractures   11/29/2018 Miscellaneous   Baseline labs -Hgb 10, Ca 12.2, Cr 2.89, albumin 2.9 -M-spike 3.7 g/dL, free lambda 7.8, monoclonal IgG lambda on IFE, quant IgG 5602, LDH 180, B2M 6.8   01/09/2019 -  Chemotherapy   The patient had bortezomib SQ (VELCADE) chemo injection 2.5 mg, 1.3 mg/m2 = 2.5 mg, Subcutaneous,  Once, 6 of 10 cycles Administration: 2.5 mg (01/09/2019), 2.5 mg (01/16/2019), 2.5 mg (01/23/2019), 2.5 mg (01/30/2019), 2.5 mg (02/06/2019), 2.5 mg (02/13/2019), 2.5 mg (02/20/2019), 2.5 mg (02/27/2019), 2.5 mg (03/06/2019), 2.5 mg (03/13/2019), 2.5 mg (03/20/2019), 2.5 mg (03/27/2019), 2.5 mg (04/03/2019), 2.5 mg (04/10/2019), 2.5 mg (04/17/2019), 2.5 mg (04/24/2019), 2.5 mg (05/01/2019)  for chemotherapy treatment.      REVIEW OF SYSTEMS:   Constitutional: ( - ) fevers, ( - )  chills , ( - ) night sweats Eyes: ( - ) blurriness of vision, ( - ) double vision, ( - ) watery eyes Ears, nose, mouth, throat, and face: ( - ) mucositis, ( - ) sore throat Respiratory: ( - ) cough, ( - ) dyspnea, ( - ) wheezes Cardiovascular: ( - ) palpitation, ( - ) chest  discomfort, Gastrointestinal:  ( - ) nausea, ( - ) heartburn, ( - ) change in bowel habits Skin: ( - ) abnormal skin rashes Lymphatics: ( - ) new lymphadenopathy, ( - ) easy bruising Neurological: ( - ) numbness, ( - ) tingling, ( - ) new weaknesses Behavioral/Psych: ( - ) mood change, ( - ) new changes  All other systems were reviewed with the patient and are negative.  I have reviewed the past medical history, past surgical history, social history and family history with the patient and they are unchanged from previous note.  ALLERGIES:  has No Known Allergies.  MEDICATIONS:  Current Outpatient Medications  Medication Sig Dispense Refill  . acyclovir (ZOVIRAX) 400 MG tablet Take 1 tablet (400 mg total) by mouth 2 (two) times daily. (Patient taking differently: Take 400 mg by mouth 2 (two) times daily as needed (fever blisters). ) 60 tablet 3  . aspirin EC 81 MG tablet Take 81 mg by mouth daily.    Marland Kitchen CALCIUM-VITAMIN D PO Take 1 tablet by mouth 2 (two) times a day.    . prochlorperazine (COMPAZINE) 10 MG tablet Take 1 tablet (10 mg total) by mouth every  6 (six) hours as needed (Nausea or vomiting). 30 tablet 1  . REVLIMID 20 MG capsule TAKE 1 CAPSULE BY MOUTH  DAILY. (DO NOT BREAK,  CRUSH, OR CHEW  CAPSULE--SWALLOW WHOLE) 14 capsule 0  . sodium polystyrene (KAYEXALATE) powder Take by mouth See admin instructions. Take 15g daily x 2 days 30 g 0  . LORazepam (ATIVAN) 0.5 MG tablet Take 1 tablet (0.5 mg total) by mouth every 6 (six) hours as needed (Nausea or vomiting). (Patient not taking: Reported on 05/08/2019) 30 tablet 0  . Multiple Vitamin (MULTIVITAMIN WITH MINERALS) TABS Take 1 tablet by mouth daily.    . ondansetron (ZOFRAN ODT) 4 MG disintegrating tablet Take 1 tablet (4 mg total) by mouth every 8 (eight) hours as needed. (Patient not taking: Reported on 05/08/2019) 20 tablet 0  . ondansetron (ZOFRAN) 8 MG tablet Take 1 tablet (8 mg total) by mouth 2 (two) times daily as needed (Nausea  or vomiting). (Patient not taking: Reported on 05/01/2019) 30 tablet 1   No current facility-administered medications for this visit.    Facility-Administered Medications Ordered in Other Visits  Medication Dose Route Frequency Provider Last Rate Last Dose  . bortezomib SQ (VELCADE) chemo injection 2.5 mg  1.3 mg/m2 (Treatment Plan Recorded) Subcutaneous Once [name], MD      . prochlorperazine (COMPAZINE) tablet 10 mg  10 mg Oral Once [name], MD        PHYSICAL EXAMINATION: ECOG PERFORMANCE STATUS: 1 - Symptomatic but completely ambulatory  Today's Vitals   05/08/19 1230  BP: 111/68  Pulse: 66  Resp: 18  Temp: 97.9 F (36.6 C)  TempSrc: Oral  SpO2: 100%  Weight: 162 lb 6.4 oz (73.7 kg)  Height: '5\' 9"'  (1.753 m)  PainSc: 0-No pain   Body mass index is 23.98 kg/m.  Filed Weights   05/08/19 1230  Weight: 162 lb 6.4 oz (73.7 kg)    GENERAL: alert, no distress and comfortable SKIN: skin color, texture, turgor are normal, no rashes or significant lesions EYES: conjunctiva are pink and non-injected, sclera clear OROPHARYNX: no exudate, no erythema; lips, buccal mucosa, and tongue normal  NECK: supple, non-tender LUNGS: clear to auscultation with normal breathing effort HEART: regular rate & rhythm and no murmurs and no lower extremity edema ABDOMEN: soft, non-tender, non-distended, normal bowel sounds Musculoskeletal: no cyanosis of digits and no clubbing  PSYCH: alert & oriented x 3, fluent speech NEURO: no focal motor/sensory deficits  LABORATORY DATA:  I have reviewed the data as listed    Component Value Date/Time   NA 137 05/08/2019 1127   K 5.4 (H) 05/08/2019 1127   CL 101 05/08/2019 1127   CO2 27 05/08/2019 1127   GLUCOSE 125 (H) 05/08/2019 1127   BUN 28 (H) 05/08/2019 1127   CREATININE 0.91 05/08/2019 1127   CALCIUM 10.4 (H) 05/08/2019 1127   PROT 7.3 05/08/2019 1127   ALBUMIN 4.4 05/08/2019 1127   AST 20 05/08/2019 1127   ALT 26 05/08/2019 1127    ALKPHOS 84 05/08/2019 1127   BILITOT 0.6 05/08/2019 1127   GFRNONAA >60 05/08/2019 1127   GFRAA >60 05/08/2019 1127    No results found for: SPEP, UPEP  Lab Results  Component Value Date   WBC 5.9 05/08/2019   NEUTROABS 4.9 05/08/2019   HGB 13.3 05/08/2019   HCT 39.4 05/08/2019   MCV 92.7 05/08/2019   PLT 196 05/08/2019      Chemistry      Component Value Date/Time  NA 137 05/08/2019 1127   K 5.4 (H) 05/08/2019 1127   CL 101 05/08/2019 1127   CO2 27 05/08/2019 1127   BUN 28 (H) 05/08/2019 1127   CREATININE 0.91 05/08/2019 1127      Component Value Date/Time   CALCIUM 10.4 (H) 05/08/2019 1127   ALKPHOS 84 05/08/2019 1127   AST 20 05/08/2019 1127   ALT 26 05/08/2019 1127   BILITOT 0.6 05/08/2019 1127

## 2019-05-07 ENCOUNTER — Other Ambulatory Visit: Payer: Self-pay | Admitting: Hematology

## 2019-05-07 DIAGNOSIS — C9000 Multiple myeloma not having achieved remission: Secondary | ICD-10-CM

## 2019-05-07 DIAGNOSIS — M25561 Pain in right knee: Secondary | ICD-10-CM | POA: Insufficient documentation

## 2019-05-08 ENCOUNTER — Inpatient Hospital Stay: Payer: 59

## 2019-05-08 ENCOUNTER — Inpatient Hospital Stay (HOSPITAL_BASED_OUTPATIENT_CLINIC_OR_DEPARTMENT_OTHER): Payer: 59 | Admitting: Hematology

## 2019-05-08 ENCOUNTER — Other Ambulatory Visit: Payer: Self-pay

## 2019-05-08 ENCOUNTER — Encounter: Payer: Self-pay | Admitting: Hematology

## 2019-05-08 ENCOUNTER — Telehealth: Payer: Self-pay | Admitting: Hematology

## 2019-05-08 VITALS — BP 111/68 | HR 66 | Temp 97.9°F | Resp 18 | Ht 69.0 in | Wt 162.4 lb

## 2019-05-08 DIAGNOSIS — M25461 Effusion, right knee: Secondary | ICD-10-CM | POA: Diagnosis not present

## 2019-05-08 DIAGNOSIS — C9000 Multiple myeloma not having achieved remission: Secondary | ICD-10-CM

## 2019-05-08 DIAGNOSIS — E875 Hyperkalemia: Secondary | ICD-10-CM

## 2019-05-08 DIAGNOSIS — Z5112 Encounter for antineoplastic immunotherapy: Secondary | ICD-10-CM | POA: Diagnosis not present

## 2019-05-08 LAB — CMP (CANCER CENTER ONLY)
ALT: 26 U/L (ref 0–44)
AST: 20 U/L (ref 15–41)
Albumin: 4.4 g/dL (ref 3.5–5.0)
Alkaline Phosphatase: 84 U/L (ref 38–126)
Anion gap: 9 (ref 5–15)
BUN: 28 mg/dL — ABNORMAL HIGH (ref 8–23)
CO2: 27 mmol/L (ref 22–32)
Calcium: 10.4 mg/dL — ABNORMAL HIGH (ref 8.9–10.3)
Chloride: 101 mmol/L (ref 98–111)
Creatinine: 0.91 mg/dL (ref 0.61–1.24)
GFR, Est AFR Am: >60 mL/min (ref >60)
GFR, Estimated: >60 mL/min (ref >60)
Glucose, Bld: 125 mg/dL — ABNORMAL HIGH (ref 70–99)
Potassium: 5.4 mmol/L — ABNORMAL HIGH (ref 3.5–5.1)
Sodium: 137 mmol/L (ref 135–145)
Total Bilirubin: 0.6 mg/dL (ref 0.3–1.2)
Total Protein: 7.3 g/dL (ref 6.5–8.1)

## 2019-05-08 LAB — CBC WITH DIFFERENTIAL (CANCER CENTER ONLY)
Abs Immature Granulocytes: 0.04 10*3/uL (ref 0.00–0.07)
Basophils Absolute: 0 10*3/uL (ref 0.0–0.1)
Basophils Relative: 0 %
Eosinophils Absolute: 0 10*3/uL (ref 0.0–0.5)
Eosinophils Relative: 0 %
HCT: 39.4 % (ref 39.0–52.0)
Hemoglobin: 13.3 g/dL (ref 13.0–17.0)
Immature Granulocytes: 1 %
Lymphocytes Relative: 7 %
Lymphs Abs: 0.4 10*3/uL — ABNORMAL LOW (ref 0.7–4.0)
MCH: 31.3 pg (ref 26.0–34.0)
MCHC: 33.8 g/dL (ref 30.0–36.0)
MCV: 92.7 fL (ref 80.0–100.0)
Monocytes Absolute: 0.5 10*3/uL (ref 0.1–1.0)
Monocytes Relative: 9 %
Neutro Abs: 4.9 10*3/uL (ref 1.7–7.7)
Neutrophils Relative %: 83 %
Platelet Count: 196 10*3/uL (ref 150–400)
RBC: 4.25 MIL/uL (ref 4.22–5.81)
RDW: 14.1 % (ref 11.5–15.5)
WBC Count: 5.9 10*3/uL (ref 4.0–10.5)
nRBC: 0 % (ref 0.0–0.2)

## 2019-05-08 MED ORDER — SODIUM POLYSTYRENE SULFONATE PO POWD: ORAL | 0 refills | Status: DC

## 2019-05-08 MED ORDER — BORTEZOMIB CHEMO SQ INJECTION 3.5 MG (2.5MG/ML)
1.3000 mg/m2 | Freq: Once | INTRAMUSCULAR | Status: AC
  Administered 2019-05-08: 13:00:00 2.5 mg via SUBCUTANEOUS
  Filled 2019-05-08: qty 1

## 2019-05-08 MED ORDER — PROCHLORPERAZINE MALEATE 10 MG PO TABS: 10.0000 mg | ORAL_TABLET | Freq: Once | ORAL | Status: DC

## 2019-05-08 NOTE — Telephone Encounter (Signed)
Appointments scheduled avs/calendar printed per 6/18 los °

## 2019-05-08 NOTE — Patient Instructions (Signed)

## 2019-05-12 ENCOUNTER — Other Ambulatory Visit: Payer: Self-pay | Admitting: *Deleted

## 2019-05-12 DIAGNOSIS — C9000 Multiple myeloma not having achieved remission: Secondary | ICD-10-CM

## 2019-05-12 MED ORDER — LENALIDOMIDE 20 MG PO CAPS: 20.0000 mg | ORAL_CAPSULE | Freq: Every day | ORAL | 0 refills | Status: DC

## 2019-05-15 ENCOUNTER — Inpatient Hospital Stay: Payer: 59

## 2019-05-15 ENCOUNTER — Ambulatory Visit: Payer: 59 | Admitting: Hematology

## 2019-05-15 ENCOUNTER — Other Ambulatory Visit: Payer: Self-pay | Admitting: Hematology

## 2019-05-15 ENCOUNTER — Other Ambulatory Visit: Payer: 59

## 2019-05-15 ENCOUNTER — Other Ambulatory Visit: Payer: Self-pay

## 2019-05-15 DIAGNOSIS — E875 Hyperkalemia: Secondary | ICD-10-CM

## 2019-05-15 DIAGNOSIS — Z5112 Encounter for antineoplastic immunotherapy: Secondary | ICD-10-CM | POA: Diagnosis not present

## 2019-05-15 LAB — BASIC METABOLIC PANEL - CANCER CENTER ONLY
Anion gap: 5 (ref 5–15)
BUN: 30 mg/dL — ABNORMAL HIGH (ref 8–23)
CO2: 29 mmol/L (ref 22–32)
Calcium: 8.9 mg/dL (ref 8.9–10.3)
Chloride: 103 mmol/L (ref 98–111)
Creatinine: 0.78 mg/dL (ref 0.61–1.24)
GFR, Est AFR Am: >60 mL/min (ref >60)
GFR, Estimated: >60 mL/min (ref >60)
Glucose, Bld: 115 mg/dL — ABNORMAL HIGH (ref 70–99)
Potassium: 4.2 mmol/L (ref 3.5–5.1)
Sodium: 137 mmol/L (ref 135–145)

## 2019-05-21 ENCOUNTER — Other Ambulatory Visit: Payer: Self-pay | Admitting: Hematology

## 2019-05-21 ENCOUNTER — Telehealth: Payer: Self-pay | Admitting: Pharmacy Technician

## 2019-05-21 ENCOUNTER — Telehealth: Payer: Self-pay | Admitting: Pharmacist

## 2019-05-21 DIAGNOSIS — C9000 Multiple myeloma not having achieved remission: Secondary | ICD-10-CM

## 2019-05-21 DIAGNOSIS — Z7189 Other specified counseling: Secondary | ICD-10-CM

## 2019-05-21 MED ORDER — DEXAMETHASONE 4 MG PO TABS: ORAL_TABLET | ORAL | 3 refills | Status: DC

## 2019-05-21 MED ORDER — CYCLOPHOSPHAMIDE 50 MG PO CAPS: 300.0000 mg/m2 | ORAL_CAPSULE | ORAL | 5 refills | Status: DC

## 2019-05-21 MED FILL — CYCLOPHOSPHAMIDE 50 MG CAP: 50 | 28 days supply | Qty: 48 | Fill #0

## 2019-05-21 NOTE — Progress Notes (Signed)
DISCONTINUE ON PATHWAY REGIMEN - Multiple Myeloma and Other Plasma Cell Dyscrasias     A cycle is every 21 days:     Bortezomib      Lenalidomide      Dexamethasone   **Always confirm dose/schedule in your pharmacy ordering system**  REASON: Other Reason PRIOR TREATMENT: [id number]: VRd (Bortezomib 1.3 mg/m2 Subcut D1, 8, 15 + Lenalidomide 25 mg + Dexamethasone 40 mg) q21 Days x 4-6 Cycles Maximum Prior to [employer] [employer] TREATMENT RESPONSE: Partial Response (PR)  Multiple Myeloma and Other Plasma Cell Dyscrasias - No Medical Intervention - Off Treatment.  Patient Characteristics: Newly Diagnosed, Transplant Eligible, Unknown or Awaiting Test Results R-ISS Staging: Unknown Disease Classification: Newly Diagnosed Is Patient Eligible for Transplant<= Transplant Eligible Risk Status: Awaiting Test Results

## 2019-05-21 NOTE — Telephone Encounter (Signed)
Scheduled delivery of medication for 7/2 from [facility].  Patient was told to not start medication until Dr Maylon Peppers instructs him to.

## 2019-05-21 NOTE — Telephone Encounter (Signed)
Oral Oncology Pharmacist Encounter  Received new prescription for Cytoxan PO (cyclophosphamide) for the treatment of multiple myeloma in conjunction with bortezomib and dexamethasone, planned duration 2-3 cycles then re-assess his response.  CBC/CMP from 05/08/2019 assessed, no relevant lab abnormalities. Prescription dose and frequency assessed.   Current medication list in Epic reviewed, no relevant DDIs with DDI identified.  Prescription has been e-scribed to the [facility] for benefits analysis and approval.  Oral Oncology Clinic will continue to follow for insurance authorization, copayment issues, initial counseling and start date.  Alyson N. Leonard, PharmD, BCPS, BCOP Hematology/Oncology Clinical Pharmacist [facility] Oral Chemotherapy Navigation Clinic [phone]  05/21/2019 10:53 AM  

## 2019-05-21 NOTE — Progress Notes (Signed)
DISCONTINUE ON PATHWAY REGIMEN - Multiple Myeloma and Other Plasma Cell Dyscrasias  No Medical Intervention - Off Treatment.  REASON: Disease Progression PRIOR TREATMENT: [id number]: Referral to Transplant Service  START ON PATHWAY REGIMEN - Multiple Myeloma and Other Plasma Cell Dyscrasias     A cycle is every 28 days:     Bortezomib      Cyclophosphamide      Dexamethasone   **Always confirm dose/schedule in your pharmacy ordering system**  Patient Characteristics: Newly Diagnosed, Transplant Eligible, Unknown or Awaiting Test Results R-ISS Staging: Unknown Disease Classification: Newly Diagnosed Is Patient Eligible for Transplant<= Transplant Eligible Risk Status: Awaiting Test Results Intent of Therapy: Non-Curative / Palliative Intent, Discussed with Patient

## 2019-05-21 NOTE — Telephone Encounter (Signed)
Oral Chemotherapy Pharmacist Encounter  Medication will be delivered on 7/2. He knows not to start until given the go ahead by Dr. Maylon Peppers.  Patient Education I spoke with patient for overview of new oral chemotherapy medication: Cytoxan PO (cyclophosphamide) for the treatment of multiple myeloma in conjunction with bortezomib and dexamethasone, planned duration 2-3 cycles then re-assess his response.  Counseled patient on administration, dosing, side effects, monitoring, drug-food interactions, safe handling, storage, and disposal. Patient will take 12 capsules (600 mg total) by mouth once a week. Take with food to minimize GI upset. Take early in the day and maintain hydration.  Side effects include but not limited to: decreased wbc/hgb/plt, N/V.    Cyclophosphamide is highly emetic risk, recommended that Mr. Mullaly take his anti-nausea medication 34mn-1hr before his oral chemo for prophylaxis then take as needed.  Reviewed with patient importance of keeping a medication schedule and plan for any missed doses.  Mr. HDikesvoiced understanding and appreciation.  All questions answered. Medication handout placed in the mail.  Provided [facility] with [facility]. Patient knows to call the office with questions or concerns. Oral Chemotherapy Navigation Clinic will continue to follow.  ADarl Pikes PharmD, BCPS, BL[facility] HospitalHematology/Oncology Clinical Pharmacist [facility]/AP [phone] 05/21/2019 12:15 PM

## 2019-05-21 NOTE — Telephone Encounter (Signed)
Oral Oncology Patient Advocate Encounter  After completing a benefits investigation, prior authorization for Cyclophosphamide (Cytoxan) is not required at this time through [employer].  Patient's copay is $0.00.    Danny Juarez Patient [place] Phone [phone] Fax [phone] 05/21/2019 11:27 AM

## 2019-05-22 ENCOUNTER — Inpatient Hospital Stay: Payer: 59

## 2019-05-22 ENCOUNTER — Inpatient Hospital Stay: Payer: 59 | Attending: Hematology

## 2019-05-22 ENCOUNTER — Encounter: Payer: Self-pay | Admitting: Hematology

## 2019-05-22 ENCOUNTER — Other Ambulatory Visit: Payer: 59

## 2019-05-22 ENCOUNTER — Other Ambulatory Visit: Payer: Self-pay

## 2019-05-22 ENCOUNTER — Inpatient Hospital Stay (HOSPITAL_BASED_OUTPATIENT_CLINIC_OR_DEPARTMENT_OTHER): Payer: 59 | Admitting: Hematology

## 2019-05-22 VITALS — BP 115/63 | HR 83 | Temp 98.7°F | Resp 18 | Ht 69.0 in | Wt 165.1 lb

## 2019-05-22 DIAGNOSIS — Z5112 Encounter for antineoplastic immunotherapy: Secondary | ICD-10-CM | POA: Diagnosis not present

## 2019-05-22 DIAGNOSIS — M84422A Pathological fracture, left humerus, initial encounter for fracture: Secondary | ICD-10-CM | POA: Diagnosis not present

## 2019-05-22 DIAGNOSIS — C9000 Multiple myeloma not having achieved remission: Secondary | ICD-10-CM | POA: Diagnosis not present

## 2019-05-22 DIAGNOSIS — D6481 Anemia due to antineoplastic chemotherapy: Secondary | ICD-10-CM

## 2019-05-22 DIAGNOSIS — T451X5A Adverse effect of antineoplastic and immunosuppressive drugs, initial encounter: Secondary | ICD-10-CM

## 2019-05-22 DIAGNOSIS — D701 Agranulocytosis secondary to cancer chemotherapy: Secondary | ICD-10-CM | POA: Diagnosis not present

## 2019-05-22 DIAGNOSIS — Z7189 Other specified counseling: Secondary | ICD-10-CM

## 2019-05-22 LAB — CMP (CANCER CENTER ONLY)
ALT: 23 U/L (ref 0–44)
AST: 24 U/L (ref 15–41)
Albumin: 3.8 g/dL (ref 3.5–5.0)
Alkaline Phosphatase: 77 U/L (ref 38–126)
Anion gap: 6 (ref 5–15)
BUN: 23 mg/dL (ref 8–23)
CO2: 30 mmol/L (ref 22–32)
Calcium: 9.6 mg/dL (ref 8.9–10.3)
Chloride: 105 mmol/L (ref 98–111)
Creatinine: 0.89 mg/dL (ref 0.61–1.24)
GFR, Est AFR Am: >60 mL/min (ref >60)
GFR, Estimated: >60 mL/min (ref >60)
Glucose, Bld: 104 mg/dL — ABNORMAL HIGH (ref 70–99)
Potassium: 4.7 mmol/L (ref 3.5–5.1)
Sodium: 141 mmol/L (ref 135–145)
Total Bilirubin: 0.7 mg/dL (ref 0.3–1.2)
Total Protein: 6.6 g/dL (ref 6.5–8.1)

## 2019-05-22 LAB — CBC WITH DIFFERENTIAL (CANCER CENTER ONLY)
Abs Immature Granulocytes: 0.02 10*3/uL (ref 0.00–0.07)
Basophils Absolute: 0 10*3/uL (ref 0.0–0.1)
Basophils Relative: 1 %
Eosinophils Absolute: 0 10*3/uL (ref 0.0–0.5)
Eosinophils Relative: 1 %
HCT: 36.9 % — ABNORMAL LOW (ref 39.0–52.0)
Hemoglobin: 12.5 g/dL — ABNORMAL LOW (ref 13.0–17.0)
Immature Granulocytes: 1 %
Lymphocytes Relative: 31 %
Lymphs Abs: 1 10*3/uL (ref 0.7–4.0)
MCH: 31.2 pg (ref 26.0–34.0)
MCHC: 33.9 g/dL (ref 30.0–36.0)
MCV: 92 fL (ref 80.0–100.0)
Monocytes Absolute: 0.4 10*3/uL (ref 0.1–1.0)
Monocytes Relative: 11 %
Neutro Abs: 1.7 10*3/uL (ref 1.7–7.7)
Neutrophils Relative %: 55 %
Platelet Count: 173 10*3/uL (ref 150–400)
RBC: 4.01 MIL/uL — ABNORMAL LOW (ref 4.22–5.81)
RDW: 14.6 % (ref 11.5–15.5)
WBC Count: 3.1 10*3/uL — ABNORMAL LOW (ref 4.0–10.5)
nRBC: 0 % (ref 0.0–0.2)

## 2019-05-22 NOTE — Progress Notes (Signed)
[facility] OFFICE PROGRESS NOTE  Patient Care Team: Patient, No Pcp Per as PCP - General (General Practice) Danny Poche, RN as Oncology Nurse Navigator Danny Men, MD as Medical Oncologist (Hematology)  HEME/ONC OVERVIEW: 1. 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 and DS  -10/2018: MRI of the right shoulder for arm pain showed innumerable enhancing lesions throughout the right humerus, ribs and right scapula; no fractures -11/2018: baseline labs  Hgb 10, Ca 12.2, Cr 2.89, albumin 2.9  M-spike 3.7 g/dL, free lambda 7.8, monoclonal IgG lambda on IFE, quant IgG 5602, LDH 180, B2M 6.8  PET showed innumerable lytic lesions involving the axial and proximal appendicular skeleton and calvarium; no plasmacytoma   BM bx: normocellular marrow with plasma cell neoplasm (~64% of all cells); myeloma FISH showed trisomy 11 and gain of ATM gene (standard risk) -12/2018 - 04/2019: 1st line q21d RVd, PR   Pre-BMT bone marrow eval showed hypocellular marrow with persistent 10-15% lambda-restricted plasma cells  -05/2019 - present: 2nd line CyBorD   2. 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 The intent is for palliative. -We discussed some of the risks, benefits, side-effects of Cytoxan, Bortezemib and Dexamethasone.  -Some of the short term side-effects included, though not limited to, risk of fatigue, risk of allergic reactions, mouth sores, weight loss, pancytopenia, life-threatening infections, need for transfusions of blood products, nausea, vomiting, change in bowel habits, blood clots, admission to hospital for various reasons, and risks of death.  -Long term side-effects are also discussed including risks of infertility, permanent damage to nerve function, chronic fatigue, and rare secondary malignancy including bone marrow disorders and leukemia.  -The patient is aware that the response rates discussed earlier is not guaranteed.  After a long discussion, patient made an informed decision to proceed with the prescribed plan of care.  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   All  questions were answered. The patient knows to call the clinic with any problems, questions or concerns. No barriers to learning was detected.  Return in 4 weeks on C1D22 of CyBorD for labs, port flush, injection and clinic appt.  Danny Men, MD 05/22/2019 10:41 AM  CHIEF COMPLAINT: "I am doing fine"  INTERVAL HISTORY: [name] returns to clinic for follow-up of multiple myeloma.  Patient was recently seen by bone marrow transplant at [facility] for pretransplant evaluation.  Unfortunately, his bone marrow biopsy showed persistent plasma cell dyscrasia (10 to 15% plasma cells) in the bone marrow.  After lengthy discussion with his bone marrow transplant physician, the patient was recommended to start second line CyBorD with the goal of further reducing of plasma cell population.  Patient reports his arm pain is much better, and the the range of motion is improving.  He otherwise denies any other complaint today.  SUMMARY OF ONCOLOGIC HISTORY: Oncology History  Multiple myeloma ([place])  11/11/2018 Imaging   MRI right shoulder for arm pain showed innumerable enhancing lesions throughout the right humerus, ribs and right scapula; no fractures   11/29/2018 Miscellaneous   Baseline labs -Hgb 10, Ca 12.2, Cr 2.89, albumin 2.9 -M-spike 3.7 g/dL, free lambda 7.8, monoclonal IgG lambda on IFE, quant IgG 5602, LDH 180, B2M 6.8   01/09/2019 - 05/14/2019 Chemotherapy   The patient had bortezomib SQ (VELCADE) chemo injection 2.5 mg, 1.3 mg/m2 = 2.5 mg, Subcutaneous,  Once, 6 of 10 cycles Administration: 2.5 mg (01/09/2019), 2.5 mg (01/16/2019), 2.5 mg (01/23/2019), 2.5 mg (01/30/2019), 2.5 mg (02/06/2019), 2.5 mg (02/13/2019), 2.5 mg (02/20/2019), 2.5 mg (02/27/2019), 2.5 mg (03/06/2019), 2.5 mg (03/13/2019), 2.5 mg (03/20/2019), 2.5 mg (03/27/2019), 2.5 mg (04/03/2019), 2.5 mg (04/10/2019), 2.5 mg (04/17/2019), 2.5 mg (04/24/2019), 2.5 mg (05/01/2019), 2.5 mg (05/08/2019)  for chemotherapy treatment.    05/29/2019 -  Chemotherapy    The patient had palonosetron (ALOXI) injection 0.25 mg, 0.25 mg, Intravenous,  Once, 0 of 4 cycles bortezomib SQ (VELCADE) chemo injection 2.75 mg, 1.5 mg/m2 = 2.75 mg, Subcutaneous,  Once, 0 of 4 cycles  for chemotherapy treatment.      REVIEW OF SYSTEMS:   Constitutional: ( - ) fevers, ( - )  chills , ( - ) night sweats Eyes: ( - ) blurriness of vision, ( - ) double vision, ( - ) watery eyes Ears, nose, mouth, throat, and face: ( - ) mucositis, ( - ) sore throat Respiratory: ( - ) cough, ( - ) dyspnea, ( - ) wheezes Cardiovascular: ( - ) palpitation, ( - ) chest discomfort, ( - ) lower extremity swelling Gastrointestinal:  ( - ) nausea, ( - ) heartburn, ( - ) change in bowel habits Skin: ( - ) abnormal skin rashes Lymphatics: ( - ) new lymphadenopathy, ( - ) easy bruising Neurological: ( - ) numbness, ( - ) tingling, ( - ) new weaknesses Behavioral/Psych: ( - ) mood change, ( - ) new changes  All other systems were reviewed with the patient and are negative.  I have reviewed the past medical history, past surgical history, social history and family history with the patient and they are unchanged from previous note.  ALLERGIES:  has No Known Allergies.  MEDICATIONS:  Current Outpatient Medications  Medication Sig Dispense Refill  . acyclovir (ZOVIRAX) 400 MG tablet Take 1 tablet (400 mg total) by  mouth 2 (two) times daily. (Patient taking differently: Take 400 mg by mouth 2 (two) times daily as needed (fever blisters). ) 60 tablet 3  . aspirin EC 81 MG tablet Take 81 mg by mouth daily.    Marland Kitchen CALCIUM-VITAMIN D PO Take 1 tablet by mouth 2 (two) times a day.    . cyclophosphamide (CYTOXAN) 50 MG capsule Take 12 capsules (600 mg total) by mouth once a week. Take with food to minimize GI upset. Take early in the day and maintain hydration. 48 capsule 5  . dexamethasone (DECADRON) 4 MG tablet Take 10 tablets (40 mg) on days 1, 8, 15, and 22 of chemo. Repeat every 28 days. Take 2 tablets  (3m) daily for 2 days after days 1 and 8 of chemo. 30 tablet 3  . LORazepam (ATIVAN) 0.5 MG tablet Take 1 tablet (0.5 mg total) by mouth every 6 (six) hours as needed (Nausea or vomiting). 30 tablet 0  . ondansetron (ZOFRAN ODT) 4 MG disintegrating tablet Take 1 tablet (4 mg total) by mouth every 8 (eight) hours as needed. 20 tablet 0  . prochlorperazine (COMPAZINE) 10 MG tablet Take 1 tablet (10 mg total) by mouth every 6 (six) hours as needed (Nausea or vomiting). 30 tablet 1  . sodium polystyrene (KAYEXALATE) powder Take by mouth See admin instructions. Take 15g daily x 2 days 30 g 0  . Multiple Vitamin (MULTIVITAMIN WITH MINERALS) TABS Take 1 tablet by mouth daily.    . ondansetron (ZOFRAN) 8 MG tablet Take 1 tablet (8 mg total) by mouth 2 (two) times daily as needed (Nausea or vomiting). (Patient not taking: Reported on 05/22/2019) 30 tablet 1   No current facility-administered medications for this visit.     PHYSICAL EXAMINATION: ECOG PERFORMANCE STATUS: 1 - Symptomatic but completely ambulatory  Today's Vitals   05/22/19 0954  BP: 115/63  Pulse: 83  Resp: 18  Temp: 98.7 F (37.1 C)  TempSrc: Oral  SpO2: 96%  Weight: 165 lb 1.9 oz (74.9 kg)  Height: '5\' 9"'  (1.753 m)  PainSc: 0-No pain   Body mass index is 24.38 kg/m.  Filed Weights   05/22/19 0954  Weight: 165 lb 1.9 oz (74.9 kg)    GENERAL: alert, no distress and comfortable SKIN: skin color, texture, turgor are normal, no rashes or significant lesions EYES: conjunctiva are pink and non-injected, sclera clear OROPHARYNX: no exudate, no erythema; lips, buccal mucosa, and tongue normal  NECK: supple, non-tender LUNGS: clear to auscultation with normal breathing effort HEART: regular rate & rhythm and no murmurs and no lower extremity edema ABDOMEN: soft, non-tender, non-distended, normal bowel sounds Musculoskeletal: no cyanosis of digits and no clubbing  PSYCH: alert & oriented x 3, fluent speech NEURO: no focal  motor/sensory deficits  LABORATORY DATA:  I have reviewed the data as listed    Component Value Date/Time   NA 141 05/22/2019 0927   K 4.7 05/22/2019 0927   CL 105 05/22/2019 0927   CO2 30 05/22/2019 0927   GLUCOSE 104 (H) 05/22/2019 0927   BUN 23 05/22/2019 0927   CREATININE 0.89 05/22/2019 0927   CALCIUM 9.6 05/22/2019 0927   PROT 6.6 05/22/2019 0927   ALBUMIN 3.8 05/22/2019 0927   AST 24 05/22/2019 0927   ALT 23 05/22/2019 0927   ALKPHOS 77 05/22/2019 0927   BILITOT 0.7 05/22/2019 0927   GFRNONAA >60 05/22/2019 0927   GFRAA >60 05/22/2019 0927    No results found for: SPEP, UPEP  Lab  Results  Component Value Date   WBC 3.1 (L) 05/22/2019   NEUTROABS 1.7 05/22/2019   HGB 12.5 (L) 05/22/2019   HCT 36.9 (L) 05/22/2019   MCV 92.0 05/22/2019   PLT 173 05/22/2019      Chemistry      Component Value Date/Time   NA 141 05/22/2019 0927   K 4.7 05/22/2019 0927   CL 105 05/22/2019 0927   CO2 30 05/22/2019 0927   BUN 23 05/22/2019 0927   CREATININE 0.89 05/22/2019 0927      Component Value Date/Time   CALCIUM 9.6 05/22/2019 0927   ALKPHOS 77 05/22/2019 0927   AST 24 05/22/2019 0927   ALT 23 05/22/2019 0927   BILITOT 0.7 05/22/2019 2924

## 2019-05-29 ENCOUNTER — Ambulatory Visit: Payer: 59 | Admitting: Hematology

## 2019-05-29 ENCOUNTER — Other Ambulatory Visit: Payer: 59

## 2019-05-29 ENCOUNTER — Inpatient Hospital Stay: Payer: 59

## 2019-05-29 ENCOUNTER — Other Ambulatory Visit: Payer: Self-pay

## 2019-05-29 VITALS — BP 110/68 | HR 73 | Temp 98.0°F | Resp 18

## 2019-05-29 DIAGNOSIS — C9000 Multiple myeloma not having achieved remission: Secondary | ICD-10-CM

## 2019-05-29 DIAGNOSIS — Z7189 Other specified counseling: Secondary | ICD-10-CM

## 2019-05-29 DIAGNOSIS — Z5112 Encounter for antineoplastic immunotherapy: Secondary | ICD-10-CM | POA: Diagnosis not present

## 2019-05-29 LAB — CMP (CANCER CENTER ONLY)
ALT: 21 U/L (ref 0–44)
AST: 22 U/L (ref 15–41)
Albumin: 3.9 g/dL (ref 3.5–5.0)
Alkaline Phosphatase: 75 U/L (ref 38–126)
Anion gap: 6 (ref 5–15)
BUN: 20 mg/dL (ref 8–23)
CO2: 29 mmol/L (ref 22–32)
Calcium: 8.5 mg/dL — ABNORMAL LOW (ref 8.9–10.3)
Chloride: 103 mmol/L (ref 98–111)
Creatinine: 0.77 mg/dL (ref 0.61–1.24)
GFR, Est AFR Am: >60 mL/min (ref >60)
GFR, Estimated: >60 mL/min (ref >60)
Glucose, Bld: 109 mg/dL — ABNORMAL HIGH (ref 70–99)
Potassium: 4.2 mmol/L (ref 3.5–5.1)
Sodium: 138 mmol/L (ref 135–145)
Total Bilirubin: 0.4 mg/dL (ref 0.3–1.2)
Total Protein: 6.9 g/dL (ref 6.5–8.1)

## 2019-05-29 LAB — CBC WITH DIFFERENTIAL (CANCER CENTER ONLY)
Abs Immature Granulocytes: 0.02 10*3/uL (ref 0.00–0.07)
Basophils Absolute: 0 10*3/uL (ref 0.0–0.1)
Basophils Relative: 1 %
Eosinophils Absolute: 0 10*3/uL (ref 0.0–0.5)
Eosinophils Relative: 0 %
HCT: 36.9 % — ABNORMAL LOW (ref 39.0–52.0)
Hemoglobin: 12.6 g/dL — ABNORMAL LOW (ref 13.0–17.0)
Immature Granulocytes: 1 %
Lymphocytes Relative: 12 %
Lymphs Abs: 0.4 10*3/uL — ABNORMAL LOW (ref 0.7–4.0)
MCH: 31.3 pg (ref 26.0–34.0)
MCHC: 34.1 g/dL (ref 30.0–36.0)
MCV: 91.6 fL (ref 80.0–100.0)
Monocytes Absolute: 0.2 10*3/uL (ref 0.1–1.0)
Monocytes Relative: 5 %
Neutro Abs: 3.1 10*3/uL (ref 1.7–7.7)
Neutrophils Relative %: 81 %
Platelet Count: 185 10*3/uL (ref 150–400)
RBC: 4.03 MIL/uL — ABNORMAL LOW (ref 4.22–5.81)
RDW: 14.6 % (ref 11.5–15.5)
WBC Count: 3.8 10*3/uL — ABNORMAL LOW (ref 4.0–10.5)
nRBC: 0 % (ref 0.0–0.2)

## 2019-05-29 MED ORDER — BORTEZOMIB CHEMO SQ INJECTION 3.5 MG (2.5MG/ML)
1.5000 mg/m2 | Freq: Once | INTRAMUSCULAR | Status: AC
  Administered 2019-05-29: 13:00:00 2.75 mg via SUBCUTANEOUS
  Filled 2019-05-29: qty 1.1

## 2019-05-29 NOTE — Patient Instructions (Signed)
[facility] Discharge Instructions for Patients Receiving Chemotherapy  Today you received the following chemotherapy agents Velcade To help prevent nausea and vomiting after your treatment, we encourage you to take your nausea medication as prescribed.   If you develop nausea and vomiting that is not controlled by your nausea medication, call the clinic.   BELOW ARE SYMPTOMS THAT SHOULD BE REPORTED IMMEDIATELY:  *FEVER GREATER THAN 100.5 F  *CHILLS WITH OR WITHOUT FEVER  NAUSEA AND VOMITING THAT IS NOT CONTROLLED WITH YOUR NAUSEA MEDICATION  *UNUSUAL SHORTNESS OF BREATH  *UNUSUAL BRUISING OR BLEEDING  TENDERNESS IN MOUTH AND THROAT WITH OR WITHOUT PRESENCE OF ULCERS  *URINARY PROBLEMS  *BOWEL PROBLEMS  UNUSUAL RASH Items with * indicate a potential emergency and should be followed up as soon as possible.  Feel free to call the clinic should you have any questions or concerns. The clinic phone number is [phone].  Please show the CHEMO ALERT CARD at check-in to the Emergency Department and triage nurse.   

## 2019-06-05 ENCOUNTER — Inpatient Hospital Stay: Payer: 59

## 2019-06-05 ENCOUNTER — Ambulatory Visit: Payer: 59

## 2019-06-05 ENCOUNTER — Other Ambulatory Visit: Payer: Self-pay

## 2019-06-05 ENCOUNTER — Other Ambulatory Visit: Payer: 59

## 2019-06-05 VITALS — BP 114/63 | HR 68 | Temp 97.8°F | Resp 20

## 2019-06-05 DIAGNOSIS — Z7189 Other specified counseling: Secondary | ICD-10-CM

## 2019-06-05 DIAGNOSIS — C9000 Multiple myeloma not having achieved remission: Secondary | ICD-10-CM

## 2019-06-05 DIAGNOSIS — Z5112 Encounter for antineoplastic immunotherapy: Secondary | ICD-10-CM | POA: Diagnosis not present

## 2019-06-05 LAB — CBC WITH DIFFERENTIAL (CANCER CENTER ONLY)
Abs Immature Granulocytes: 0.03 10*3/uL (ref 0.00–0.07)
Basophils Absolute: 0 10*3/uL (ref 0.0–0.1)
Basophils Relative: 0 %
Eosinophils Absolute: 0 10*3/uL (ref 0.0–0.5)
Eosinophils Relative: 0 %
HCT: 37.4 % — ABNORMAL LOW (ref 39.0–52.0)
Hemoglobin: 12.8 g/dL — ABNORMAL LOW (ref 13.0–17.0)
Immature Granulocytes: 1 %
Lymphocytes Relative: 8 %
Lymphs Abs: 0.3 10*3/uL — ABNORMAL LOW (ref 0.7–4.0)
MCH: 31.8 pg (ref 26.0–34.0)
MCHC: 34.2 g/dL (ref 30.0–36.0)
MCV: 92.8 fL (ref 80.0–100.0)
Monocytes Absolute: 0.1 10*3/uL (ref 0.1–1.0)
Monocytes Relative: 2 %
Neutro Abs: 3.7 10*3/uL (ref 1.7–7.7)
Neutrophils Relative %: 89 %
Platelet Count: 167 10*3/uL (ref 150–400)
RBC: 4.03 MIL/uL — ABNORMAL LOW (ref 4.22–5.81)
RDW: 15.1 % (ref 11.5–15.5)
WBC Count: 4.2 10*3/uL (ref 4.0–10.5)
nRBC: 0 % (ref 0.0–0.2)

## 2019-06-05 LAB — CMP (CANCER CENTER ONLY)
ALT: 23 U/L (ref 0–44)
AST: 22 U/L (ref 15–41)
Albumin: 3.9 g/dL (ref 3.5–5.0)
Alkaline Phosphatase: 79 U/L (ref 38–126)
Anion gap: 7 (ref 5–15)
BUN: 23 mg/dL (ref 8–23)
CO2: 28 mmol/L (ref 22–32)
Calcium: 8.4 mg/dL — ABNORMAL LOW (ref 8.9–10.3)
Chloride: 104 mmol/L (ref 98–111)
Creatinine: 0.87 mg/dL (ref 0.61–1.24)
GFR, Est AFR Am: >60 mL/min (ref >60)
GFR, Estimated: >60 mL/min (ref >60)
Glucose, Bld: 119 mg/dL — ABNORMAL HIGH (ref 70–99)
Potassium: 4.5 mmol/L (ref 3.5–5.1)
Sodium: 139 mmol/L (ref 135–145)
Total Bilirubin: 0.5 mg/dL (ref 0.3–1.2)
Total Protein: 7 g/dL (ref 6.5–8.1)

## 2019-06-05 MED ORDER — BORTEZOMIB CHEMO SQ INJECTION 3.5 MG (2.5MG/ML)
1.5000 mg/m2 | Freq: Once | INTRAMUSCULAR | Status: AC
  Administered 2019-06-05: 2.75 mg via SUBCUTANEOUS
  Filled 2019-06-05: qty 1.1

## 2019-06-05 NOTE — Patient Instructions (Signed)
[facility] Discharge Instructions for Patients Receiving Chemotherapy  Today you received the following chemotherapy agents:  Velcade  To help prevent nausea and vomiting after your treatment, we encourage you to take your nausea medication as ordered per MD.    If you develop nausea and vomiting that is not controlled by your nausea medication, call the clinic.   BELOW ARE SYMPTOMS THAT SHOULD BE REPORTED IMMEDIATELY:  *FEVER GREATER THAN 100.5 F  *CHILLS WITH OR WITHOUT FEVER  NAUSEA AND VOMITING THAT IS NOT CONTROLLED WITH YOUR NAUSEA MEDICATION  *UNUSUAL SHORTNESS OF BREATH  *UNUSUAL BRUISING OR BLEEDING  TENDERNESS IN MOUTH AND THROAT WITH OR WITHOUT PRESENCE OF ULCERS  *URINARY PROBLEMS  *BOWEL PROBLEMS  UNUSUAL RASH Items with * indicate a potential emergency and should be followed up as soon as possible.  Feel free to call the clinic should you have any questions or concerns. The clinic phone number is [phone].  Please show the CHEMO ALERT CARD at check-in to the Emergency Department and triage nurse.   

## 2019-06-12 ENCOUNTER — Other Ambulatory Visit: Payer: 59

## 2019-06-12 ENCOUNTER — Ambulatory Visit: Payer: 59

## 2019-06-12 ENCOUNTER — Inpatient Hospital Stay: Payer: 59

## 2019-06-12 ENCOUNTER — Other Ambulatory Visit: Payer: Self-pay

## 2019-06-12 VITALS — BP 106/71 | HR 70 | Temp 97.5°F | Resp 16

## 2019-06-12 DIAGNOSIS — Z5112 Encounter for antineoplastic immunotherapy: Secondary | ICD-10-CM | POA: Diagnosis not present

## 2019-06-12 DIAGNOSIS — C9000 Multiple myeloma not having achieved remission: Secondary | ICD-10-CM

## 2019-06-12 DIAGNOSIS — Z7189 Other specified counseling: Secondary | ICD-10-CM

## 2019-06-12 LAB — CBC WITH DIFFERENTIAL (CANCER CENTER ONLY)
Abs Immature Granulocytes: 0.02 10*3/uL (ref 0.00–0.07)
Basophils Absolute: 0 10*3/uL (ref 0.0–0.1)
Basophils Relative: 0 %
Eosinophils Absolute: 0 10*3/uL (ref 0.0–0.5)
Eosinophils Relative: 0 %
HCT: 37.3 % — ABNORMAL LOW (ref 39.0–52.0)
Hemoglobin: 12.7 g/dL — ABNORMAL LOW (ref 13.0–17.0)
Immature Granulocytes: 1 %
Lymphocytes Relative: 8 %
Lymphs Abs: 0.3 10*3/uL — ABNORMAL LOW (ref 0.7–4.0)
MCH: 31.8 pg (ref 26.0–34.0)
MCHC: 34 g/dL (ref 30.0–36.0)
MCV: 93.5 fL (ref 80.0–100.0)
Monocytes Absolute: 0.2 10*3/uL (ref 0.1–1.0)
Monocytes Relative: 4 %
Neutro Abs: 3.8 10*3/uL (ref 1.7–7.7)
Neutrophils Relative %: 87 %
Platelet Count: 184 10*3/uL (ref 150–400)
RBC: 3.99 MIL/uL — ABNORMAL LOW (ref 4.22–5.81)
RDW: 15.2 % (ref 11.5–15.5)
WBC Count: 4.3 10*3/uL (ref 4.0–10.5)
nRBC: 0 % (ref 0.0–0.2)

## 2019-06-12 LAB — CMP (CANCER CENTER ONLY)
ALT: 19 U/L (ref 0–44)
AST: 20 U/L (ref 15–41)
Albumin: 3.8 g/dL (ref 3.5–5.0)
Alkaline Phosphatase: 67 U/L (ref 38–126)
Anion gap: 5 (ref 5–15)
BUN: 27 mg/dL — ABNORMAL HIGH (ref 8–23)
CO2: 31 mmol/L (ref 22–32)
Calcium: 8.7 mg/dL — ABNORMAL LOW (ref 8.9–10.3)
Chloride: 104 mmol/L (ref 98–111)
Creatinine: 0.83 mg/dL (ref 0.61–1.24)
GFR, Est AFR Am: >60 mL/min (ref >60)
GFR, Estimated: >60 mL/min (ref >60)
Glucose, Bld: 89 mg/dL (ref 70–99)
Potassium: 4.4 mmol/L (ref 3.5–5.1)
Sodium: 140 mmol/L (ref 135–145)
Total Bilirubin: 0.6 mg/dL (ref 0.3–1.2)
Total Protein: 6.9 g/dL (ref 6.5–8.1)

## 2019-06-12 MED ORDER — BORTEZOMIB CHEMO SQ INJECTION 3.5 MG (2.5MG/ML)
1.5000 mg/m2 | Freq: Once | INTRAMUSCULAR | Status: AC
  Administered 2019-06-12: 2.75 mg via SUBCUTANEOUS
  Filled 2019-06-12: qty 1.1

## 2019-06-12 NOTE — Patient Instructions (Signed)
Bortezomib injection What is this medicine? BORTEZOMIB (bor TEZ oh mib) is a medicine that targets proteins in cancer cells and stops the cancer cells from growing. It is used to treat multiple myeloma and mantle-cell lymphoma. This medicine may be used for other purposes; ask your health care provider or pharmacist if you have questions. COMMON BRAND NAME(S): Velcade What should I tell my health care provider before I take this medicine? They need to know if you have any of these conditions:  diabetes  heart disease  irregular heartbeat  liver disease  on hemodialysis  low blood counts, like low white blood cells, platelets, or hemoglobin  peripheral neuropathy  taking medicine for blood pressure  an unusual or allergic reaction to bortezomib, mannitol, boron, other medicines, foods, dyes, or preservatives  pregnant or trying to get pregnant  breast-feeding How should I use this medicine? This medicine is for injection into a vein or for injection under the skin. It is given by a health care professional in a hospital or clinic setting. Talk to your pediatrician regarding the use of this medicine in children. Special care may be needed. Overdosage: If you think you have taken too much of this medicine contact a poison control center or emergency room at once. NOTE: This medicine is only for you. Do not share this medicine with others. What if I miss a dose? It is important not to miss your dose. Call your doctor or health care professional if you are unable to keep an appointment. What may interact with this medicine? This medicine may interact with the following medications:  ketoconazole  rifampin  ritonavir  St. John's Wort This list may not describe all possible interactions. Give your health care provider a list of all the medicines, herbs, non-prescription drugs, or dietary supplements you use. Also tell them if you smoke, drink alcohol, or use illegal drugs. Some  items may interact with your medicine. What should I watch for while using this medicine? You may get drowsy or dizzy. Do not drive, use machinery, or do anything that needs mental alertness until you know how this medicine affects you. Do not stand or sit up quickly, especially if you are an older patient. This reduces the risk of dizzy or fainting spells. In some cases, you may be given additional medicines to help with side effects. Follow all directions for their use. Call your doctor or health care professional for advice if you get a fever, chills or sore throat, or other symptoms of a cold or flu. Do not treat yourself. This drug decreases your body's ability to fight infections. Try to avoid being around people who are sick. This medicine may increase your risk to bruise or bleed. Call your doctor or health care professional if you notice any unusual bleeding. You may need blood work done while you are taking this medicine. In some patients, this medicine may cause a serious brain infection that may cause death. If you have any problems seeing, thinking, speaking, walking, or standing, tell your doctor right away. If you cannot reach your doctor, urgently seek other source of medical care. Check with your doctor or health care professional if you get an attack of severe diarrhea, nausea and vomiting, or if you sweat a lot. The loss of too much body fluid can make it dangerous for you to take this medicine. Do not become pregnant while taking this medicine or for at least 7 months after stopping it. Women should inform their doctor   if they wish to become pregnant or think they might be pregnant. Men should not father a child while taking this medicine and for at least 4 months after stopping it. There is a potential for serious side effects to an unborn child. Talk to your health care professional or pharmacist for more information. Do not breast-feed an infant while taking this medicine or for 2  months after stopping it. This medicine may interfere with the ability to have a child. You should talk with your doctor or health care professional if you are concerned about your fertility. What side effects may I notice from receiving this medicine? Side effects that you should report to your doctor or health care professional as soon as possible:  allergic reactions like skin rash, itching or hives, swelling of the face, lips, or tongue  breathing problems  changes in hearing  changes in vision  fast, irregular heartbeat  feeling faint or lightheaded, falls  pain, tingling, numbness in the hands or feet  right upper belly pain  seizures  swelling of the ankles, feet, hands  unusual bleeding or bruising  unusually weak or tired  vomiting  yellowing of the eyes or skin Side effects that usually do not require medical attention (report to your doctor or health care professional if they continue or are bothersome):  changes in emotions or moods  constipation  diarrhea  loss of appetite  headache  irritation at site where injected  nausea This list may not describe all possible side effects. Call your doctor for medical advice about side effects. You may report side effects to FDA at 1-800-FDA-1088. Where should I keep my medicine? This drug is given in a hospital or clinic and will not be stored at home. NOTE: This sheet is a summary. It may not cover all possible information. If you have questions about this medicine, talk to your doctor, pharmacist, or health care provider.  2020 Elsevier/Gold Standard (2018-03-18 16:29:31)  

## 2019-06-19 ENCOUNTER — Ambulatory Visit: Payer: 59

## 2019-06-19 ENCOUNTER — Inpatient Hospital Stay: Payer: 59

## 2019-06-19 ENCOUNTER — Encounter: Payer: Self-pay | Admitting: Hematology

## 2019-06-19 ENCOUNTER — Other Ambulatory Visit: Payer: Self-pay

## 2019-06-19 ENCOUNTER — Telehealth: Payer: Self-pay | Admitting: Hematology

## 2019-06-19 ENCOUNTER — Other Ambulatory Visit: Payer: 59

## 2019-06-19 ENCOUNTER — Inpatient Hospital Stay (HOSPITAL_BASED_OUTPATIENT_CLINIC_OR_DEPARTMENT_OTHER): Payer: 59 | Admitting: Hematology

## 2019-06-19 VITALS — BP 103/77 | HR 65 | Temp 97.5°F | Resp 18 | Ht 69.0 in | Wt 168.0 lb

## 2019-06-19 DIAGNOSIS — C9000 Multiple myeloma not having achieved remission: Secondary | ICD-10-CM

## 2019-06-19 DIAGNOSIS — T451X5A Adverse effect of antineoplastic and immunosuppressive drugs, initial encounter: Secondary | ICD-10-CM

## 2019-06-19 DIAGNOSIS — Z79899 Other long term (current) drug therapy: Secondary | ICD-10-CM

## 2019-06-19 DIAGNOSIS — D701 Agranulocytosis secondary to cancer chemotherapy: Secondary | ICD-10-CM | POA: Diagnosis not present

## 2019-06-19 DIAGNOSIS — Z7189 Other specified counseling: Secondary | ICD-10-CM

## 2019-06-19 DIAGNOSIS — D6481 Anemia due to antineoplastic chemotherapy: Secondary | ICD-10-CM | POA: Diagnosis not present

## 2019-06-19 DIAGNOSIS — Z5112 Encounter for antineoplastic immunotherapy: Secondary | ICD-10-CM | POA: Diagnosis not present

## 2019-06-19 LAB — CBC WITH DIFFERENTIAL (CANCER CENTER ONLY)
Abs Immature Granulocytes: 0.02 10*3/uL (ref 0.00–0.07)
Basophils Absolute: 0 10*3/uL (ref 0.0–0.1)
Basophils Relative: 0 %
Eosinophils Absolute: 0 10*3/uL (ref 0.0–0.5)
Eosinophils Relative: 0 %
HCT: 36 % — ABNORMAL LOW (ref 39.0–52.0)
Hemoglobin: 12.3 g/dL — ABNORMAL LOW (ref 13.0–17.0)
Immature Granulocytes: 1 %
Lymphocytes Relative: 8 %
Lymphs Abs: 0.3 10*3/uL — ABNORMAL LOW (ref 0.7–4.0)
MCH: 32 pg (ref 26.0–34.0)
MCHC: 34.2 g/dL (ref 30.0–36.0)
MCV: 93.8 fL (ref 80.0–100.0)
Monocytes Absolute: 0.1 10*3/uL (ref 0.1–1.0)
Monocytes Relative: 3 %
Neutro Abs: 3.2 10*3/uL (ref 1.7–7.7)
Neutrophils Relative %: 88 %
Platelet Count: 203 10*3/uL (ref 150–400)
RBC: 3.84 MIL/uL — ABNORMAL LOW (ref 4.22–5.81)
RDW: 15.6 % — ABNORMAL HIGH (ref 11.5–15.5)
WBC Count: 3.7 10*3/uL — ABNORMAL LOW (ref 4.0–10.5)
nRBC: 0 % (ref 0.0–0.2)

## 2019-06-19 LAB — CMP (CANCER CENTER ONLY)
ALT: 19 U/L (ref 0–44)
AST: 22 U/L (ref 15–41)
Albumin: 3.8 g/dL (ref 3.5–5.0)
Alkaline Phosphatase: 75 U/L (ref 38–126)
Anion gap: 6 (ref 5–15)
BUN: 24 mg/dL — ABNORMAL HIGH (ref 8–23)
CO2: 26 mmol/L (ref 22–32)
Calcium: 8.6 mg/dL — ABNORMAL LOW (ref 8.9–10.3)
Chloride: 107 mmol/L (ref 98–111)
Creatinine: 0.8 mg/dL (ref 0.61–1.24)
GFR, Est AFR Am: >60 mL/min (ref >60)
GFR, Estimated: >60 mL/min (ref >60)
Glucose, Bld: 108 mg/dL — ABNORMAL HIGH (ref 70–99)
Potassium: 4.6 mmol/L (ref 3.5–5.1)
Sodium: 139 mmol/L (ref 135–145)
Total Bilirubin: 0.6 mg/dL (ref 0.3–1.2)
Total Protein: 6.9 g/dL (ref 6.5–8.1)

## 2019-06-19 MED ORDER — BORTEZOMIB CHEMO SQ INJECTION 3.5 MG (2.5MG/ML)
1.5000 mg/m2 | Freq: Once | INTRAMUSCULAR | Status: AC
  Administered 2019-06-19: 2.75 mg via SUBCUTANEOUS
  Filled 2019-06-19: qty 1.1

## 2019-06-19 NOTE — Patient Instructions (Signed)
Bortezomib injection What is this medicine? BORTEZOMIB (bor TEZ oh mib) is a medicine that targets proteins in cancer cells and stops the cancer cells from growing. It is used to treat multiple myeloma and mantle-cell lymphoma. This medicine may be used for other purposes; ask your health care provider or pharmacist if you have questions. COMMON BRAND NAME(S): Velcade What should I tell my health care provider before I take this medicine? They need to know if you have any of these conditions:  diabetes  heart disease  irregular heartbeat  liver disease  on hemodialysis  low blood counts, like low white blood cells, platelets, or hemoglobin  peripheral neuropathy  taking medicine for blood pressure  an unusual or allergic reaction to bortezomib, mannitol, boron, other medicines, foods, dyes, or preservatives  pregnant or trying to get pregnant  breast-feeding How should I use this medicine? This medicine is for injection into a vein or for injection under the skin. It is given by a health care professional in a hospital or clinic setting. Talk to your pediatrician regarding the use of this medicine in children. Special care may be needed. Overdosage: If you think you have taken too much of this medicine contact a poison control center or emergency room at once. NOTE: This medicine is only for you. Do not share this medicine with others. What if I miss a dose? It is important not to miss your dose. Call your doctor or health care professional if you are unable to keep an appointment. What may interact with this medicine? This medicine may interact with the following medications:  ketoconazole  rifampin  ritonavir  St. John's Wort This list may not describe all possible interactions. Give your health care provider a list of all the medicines, herbs, non-prescription drugs, or dietary supplements you use. Also tell them if you smoke, drink alcohol, or use illegal drugs. Some  items may interact with your medicine. What should I watch for while using this medicine? You may get drowsy or dizzy. Do not drive, use machinery, or do anything that needs mental alertness until you know how this medicine affects you. Do not stand or sit up quickly, especially if you are an older patient. This reduces the risk of dizzy or fainting spells. In some cases, you may be given additional medicines to help with side effects. Follow all directions for their use. Call your doctor or health care professional for advice if you get a fever, chills or sore throat, or other symptoms of a cold or flu. Do not treat yourself. This drug decreases your body's ability to fight infections. Try to avoid being around people who are sick. This medicine may increase your risk to bruise or bleed. Call your doctor or health care professional if you notice any unusual bleeding. You may need blood work done while you are taking this medicine. In some patients, this medicine may cause a serious brain infection that may cause death. If you have any problems seeing, thinking, speaking, walking, or standing, tell your doctor right away. If you cannot reach your doctor, urgently seek other source of medical care. Check with your doctor or health care professional if you get an attack of severe diarrhea, nausea and vomiting, or if you sweat a lot. The loss of too much body fluid can make it dangerous for you to take this medicine. Do not become pregnant while taking this medicine or for at least 7 months after stopping it. Women should inform their doctor   if they wish to become pregnant or think they might be pregnant. Men should not father a child while taking this medicine and for at least 4 months after stopping it. There is a potential for serious side effects to an unborn child. Talk to your health care professional or pharmacist for more information. Do not breast-feed an infant while taking this medicine or for 2  months after stopping it. This medicine may interfere with the ability to have a child. You should talk with your doctor or health care professional if you are concerned about your fertility. What side effects may I notice from receiving this medicine? Side effects that you should report to your doctor or health care professional as soon as possible:  allergic reactions like skin rash, itching or hives, swelling of the face, lips, or tongue  breathing problems  changes in hearing  changes in vision  fast, irregular heartbeat  feeling faint or lightheaded, falls  pain, tingling, numbness in the hands or feet  right upper belly pain  seizures  swelling of the ankles, feet, hands  unusual bleeding or bruising  unusually weak or tired  vomiting  yellowing of the eyes or skin Side effects that usually do not require medical attention (report to your doctor or health care professional if they continue or are bothersome):  changes in emotions or moods  constipation  diarrhea  loss of appetite  headache  irritation at site where injected  nausea This list may not describe all possible side effects. Call your doctor for medical advice about side effects. You may report side effects to FDA at 1-800-FDA-1088. Where should I keep my medicine? This drug is given in a hospital or clinic and will not be stored at home. NOTE: This sheet is a summary. It may not cover all possible information. If you have questions about this medicine, talk to your doctor, pharmacist, or health care provider.  2020 Elsevier/Gold Standard (2018-03-18 16:29:31)  

## 2019-06-19 NOTE — Progress Notes (Signed)
[facility] OFFICE PROGRESS NOTE  Patient Care Team: Patient, No Pcp Per as PCP - General (General Practice) Cordelia Poche, RN as Oncology Nurse Navigator [name], MD as Medical Oncologist (Hematology)  HEME/ONC OVERVIEW: 1. 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 and DS  -10/2018: MRI of the right shoulder for arm pain showed innumerable enhancing lesions throughout the right humerus, ribs and right scapula; no fractures -11/2018: baseline labs  Hgb 10, Ca 12.2, Cr 2.89, albumin 2.9  M-spike 3.7 g/dL, free lambda 7.8, monoclonal IgG lambda on IFE, quant IgG 5602, LDH 180, B2M 6.8  PET showed innumerable lytic lesions involving the axial and proximal appendicular skeleton and calvarium; no plasmacytoma   BM bx: normocellular marrow with plasma cell neoplasm (~64% of all cells); myeloma FISH showed trisomy 11 and gain of ATM gene (standard risk) -12/2018 - 04/2019: 1st line q21d RVd, PR   Pre-BMT bone marrow eval showed hypocellular marrow with persistent 10-15% lambda-restricted plasma cells  -05/2019 - present: 2nd line CyBorD   2. 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  -S/p Xgeva x 1 for Ca 12.2; Zometa held due to multiple dental procedures -02/2019: pathologic left humerus fracture -03/2019 - present: q82monthZometa   TREATMENT REGIMEN:  01/09/2019 - 05/08/2019: q21day RVd x 6 cycles, PR  04/03/2019 - present: q357monthometa; cleared by dentistry  04/08/2019: IMNP for left humerus fracture   05/29/2019 - present: q28day CyBorD   ASSESSMENT & PLAN:   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 and DS  -S/p 6 cycles of RVd -Due to PR, patient was started on CyBorD with the goal to further reduce the disease burden before transplant   -Labs adequate today, proceed with C1D22 of CyBorD -I have ordered MM labs prior to the next clinic visit to assess disease response -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  -S/p intramedullary nail placement for pathologic left humerus fracture on 04/08/2019 -q3m40monthmeta resumed in 03/2019 after dental clearance; next due in mid-06/2019 -On Ca-Vit D supplement BID   Chemotherapy-associated leukopenia -Secondary to chemotherapy -WBC 3.7k with ANC 3200, stable  -Patient denies any symptoms of infection -We will monitor for now  Chemotherapy-associated anemia -Secondary to chemotherapy -Hgb 12.3, stable -Patient denies any symptom of bleeding -We will monitor for now  Hypocalcemia -Secondary to Zometa -Ca 8.6 today, stable -Continue Ca-Vit D supplement  Orders Placed This Encounter  Procedures  . Multiple Myeloma Panel (SPEP&IFE w/QIG)    Standing Status:   Future    Standing Expiration Date:   07/23/2020  . Kappa/lambda light chains    Standing Status:   Future    Standing Expiration Date:   12/19/2020    All questions were answered. The patient knows to call the clinic with any problems, questions or concerns. No barriers to learning was detected.  Return on 07/17/2019 on C2D22 of CyBorD for labs and toxicity checks.  YanTish MenD 06/19/2019 1:20 PM  CHIEF COMPLAINT: "I am doing fine"  INTERVAL HISTORY: Mr. HalMitranoturns to clinic for follow-up multiple myeloma on CyBorD.  Patient reports that since starting the new regimen, he has tolerated it well, and denies any abdominal pain, nausea, vomiting, diarrhea, rash, or neuropathy.  He is arm  pain has improved significantly since recent surgery.  He is otherwise doing well, and denies any complaint today.  SUMMARY OF ONCOLOGIC HISTORY: Oncology History  Multiple myeloma ([place])  11/11/2018 Imaging   MRI right shoulder for arm pain showed innumerable enhancing lesions  throughout the right humerus, ribs and right scapula; no fractures   11/29/2018 Miscellaneous   Baseline labs -Hgb 10, Ca 12.2, Cr 2.89, albumin 2.9 -M-spike 3.7 g/dL, free lambda 7.8, monoclonal IgG lambda on IFE, quant IgG 5602, LDH 180, B2M 6.8   01/09/2019 - 05/14/2019 Chemotherapy   The patient had bortezomib SQ (VELCADE) chemo injection 2.5 mg, 1.3 mg/m2 = 2.5 mg, Subcutaneous,  Once, 6 of 10 cycles Administration: 2.5 mg (01/09/2019), 2.5 mg (01/16/2019), 2.5 mg (01/23/2019), 2.5 mg (01/30/2019), 2.5 mg (02/06/2019), 2.5 mg (02/13/2019), 2.5 mg (02/20/2019), 2.5 mg (02/27/2019), 2.5 mg (03/06/2019), 2.5 mg (03/13/2019), 2.5 mg (03/20/2019), 2.5 mg (03/27/2019), 2.5 mg (04/03/2019), 2.5 mg (04/10/2019), 2.5 mg (04/17/2019), 2.5 mg (04/24/2019), 2.5 mg (05/01/2019), 2.5 mg (05/08/2019)  for chemotherapy treatment.    05/29/2019 -  Chemotherapy   The patient had bortezomib SQ (VELCADE) chemo injection 2.75 mg, 1.5 mg/m2 = 2.75 mg, Subcutaneous,  Once, 1 of 4 cycles Administration: 2.75 mg (05/29/2019), 2.75 mg (06/05/2019), 2.75 mg (06/12/2019)  for chemotherapy treatment.      REVIEW OF SYSTEMS:   Constitutional: ( - ) fevers, ( - )  chills , ( - ) night sweats Eyes: ( - ) blurriness of vision, ( - ) double vision, ( - ) watery eyes Ears, nose, mouth, throat, and face: ( - ) mucositis, ( - ) sore throat Respiratory: ( - ) cough, ( - ) dyspnea, ( - ) wheezes Cardiovascular: ( - ) palpitation, ( - ) chest discomfort, ( - ) lower extremity swelling Gastrointestinal:  ( - ) nausea, ( - ) heartburn, ( - ) change in bowel habits Skin: ( - ) abnormal skin rashes Lymphatics: ( - ) new lymphadenopathy, ( - ) easy bruising Neurological: ( - ) numbness, ( - ) tingling, ( - ) new weaknesses Behavioral/Psych: ( - ) mood change, ( - ) new changes  All other systems were reviewed with the patient and are negative.  I have reviewed the past medical history, past surgical history, social history and family history with the  patient and they are unchanged from previous note.  ALLERGIES:  has No Known Allergies.  MEDICATIONS:  Current Outpatient Medications  Medication Sig Dispense Refill  . acyclovir (ZOVIRAX) 400 MG tablet Take 1 tablet (400 mg total) by mouth 2 (two) times daily. (Patient taking differently: Take 400 mg by mouth 2 (two) times daily as needed (fever blisters). ) 60 tablet 3  . aspirin EC 81 MG tablet Take 81 mg by mouth daily.    Marland Kitchen CALCIUM-VITAMIN D PO Take 1 tablet by mouth 2 (two) times a day.    . cyclophosphamide (CYTOXAN) 50 MG capsule Take 12 capsules (600 mg total) by mouth once a week. Take with food to minimize GI upset. Take early in the day and maintain hydration. 48 capsule 5  . dexamethasone (DECADRON) 4 MG tablet Take 10 tablets (40 mg) on days 1, 8, 15, and 22 of chemo. Repeat every 28 days. Take 2 tablets ('8mg'$ ) daily for 2 days after days 1 and 8 of chemo. 30 tablet 3  . LORazepam (ATIVAN) 0.5 MG tablet Take 1 tablet (0.5 mg total) by mouth every 6 (six) hours as needed (Nausea or  vomiting). 30 tablet 0  . ondansetron (ZOFRAN ODT) 4 MG disintegrating tablet Take 1 tablet (4 mg total) by mouth every 8 (eight) hours as needed. 20 tablet 0  . prochlorperazine (COMPAZINE) 10 MG tablet Take 1 tablet (10 mg total) by mouth every 6 (six) hours as needed (Nausea or vomiting). 30 tablet 1  . sodium polystyrene (KAYEXALATE) powder Take by mouth See admin instructions. Take 15g daily x 2 days 30 g 0  . Multiple Vitamin (MULTIVITAMIN WITH MINERALS) TABS Take 1 tablet by mouth daily.    . ondansetron (ZOFRAN) 8 MG tablet Take 1 tablet (8 mg total) by mouth 2 (two) times daily as needed (Nausea or vomiting). (Patient not taking: Reported on 06/19/2019) 30 tablet 1   No current facility-administered medications for this visit.     PHYSICAL EXAMINATION: ECOG PERFORMANCE STATUS: 0 - Asymptomatic  Today's Vitals   06/19/19 1314  BP: 103/77  Pulse: 65  Resp: 18  Temp: (!) 97.5 F (36.4 C)   TempSrc: Temporal  SpO2: 100%  Weight: 168 lb (76.2 kg)  Height: '5\' 9"'$  (1.753 m)  PainSc: 0-No pain   Body mass index is 24.81 kg/m.  Filed Weights   06/19/19 1314  Weight: 168 lb (76.2 kg)    GENERAL: alert, no distress and comfortable SKIN: skin color, texture, turgor are normal, no rashes or significant lesions EYES: conjunctiva are pink and non-injected, sclera clear OROPHARYNX: no exudate, no erythema; lips, buccal mucosa, and tongue normal  NECK: supple, non-tender LYMPH:  no palpable lymphadenopathy in the cervical LUNGS: clear to auscultation with normal breathing effort HEART: regular rate & rhythm and no murmurs and no lower extremity edema ABDOMEN: soft, non-tender, non-distended, normal bowel sounds Musculoskeletal: no cyanosis of digits and no clubbing  PSYCH: alert & oriented x 3, fluent speech NEURO: no focal motor/sensory deficits  LABORATORY DATA:  I have reviewed the data as listed    Component Value Date/Time   NA 140 06/12/2019 1126   K 4.4 06/12/2019 1126   CL 104 06/12/2019 1126   CO2 31 06/12/2019 1126   GLUCOSE 89 06/12/2019 1126   BUN 27 (H) 06/12/2019 1126   CREATININE 0.83 06/12/2019 1126   CALCIUM 8.7 (L) 06/12/2019 1126   PROT 6.9 06/12/2019 1126   ALBUMIN 3.8 06/12/2019 1126   AST 20 06/12/2019 1126   ALT 19 06/12/2019 1126   ALKPHOS 67 06/12/2019 1126   BILITOT 0.6 06/12/2019 1126   GFRNONAA >60 06/12/2019 1126   GFRAA >60 06/12/2019 1126    No results found for: SPEP, UPEP  Lab Results  Component Value Date   WBC 3.7 (L) 06/19/2019   NEUTROABS 3.2 06/19/2019   HGB 12.3 (L) 06/19/2019   HCT 36.0 (L) 06/19/2019   MCV 93.8 06/19/2019   PLT 203 06/19/2019      Chemistry      Component Value Date/Time   NA 140 06/12/2019 1126   K 4.4 06/12/2019 1126   CL 104 06/12/2019 1126   CO2 31 06/12/2019 1126   BUN 27 (H) 06/12/2019 1126   CREATININE 0.83 06/12/2019 1126      Component Value Date/Time   CALCIUM 8.7 (L)  06/12/2019 1126   ALKPHOS 67 06/12/2019 1126   AST 20 06/12/2019 1126   ALT 19 06/12/2019 1126   BILITOT 0.6 06/12/2019 1126       RADIOGRAPHIC STUDIES: I have personally reviewed the radiological images as listed below and agreed with the findings in the report. No results  found.

## 2019-06-19 NOTE — Telephone Encounter (Signed)
No change in appts per 7/30 los

## 2019-06-23 MED FILL — CYCLOPHOSPHAMIDE 50 MG CAP: 50 | 28 days supply | Qty: 48 | Fill #1

## 2019-06-26 ENCOUNTER — Other Ambulatory Visit: Payer: Self-pay

## 2019-06-26 ENCOUNTER — Inpatient Hospital Stay: Payer: 59 | Attending: Hematology

## 2019-06-26 ENCOUNTER — Inpatient Hospital Stay: Payer: 59

## 2019-06-26 VITALS — BP 100/64 | HR 64 | Temp 97.8°F | Resp 18

## 2019-06-26 DIAGNOSIS — C9000 Multiple myeloma not having achieved remission: Secondary | ICD-10-CM

## 2019-06-26 DIAGNOSIS — Z5112 Encounter for antineoplastic immunotherapy: Secondary | ICD-10-CM | POA: Insufficient documentation

## 2019-06-26 DIAGNOSIS — Z7189 Other specified counseling: Secondary | ICD-10-CM

## 2019-06-26 LAB — CMP (CANCER CENTER ONLY)
ALT: 24 U/L (ref 0–44)
AST: 25 U/L (ref 15–41)
Albumin: 3.8 g/dL (ref 3.5–5.0)
Alkaline Phosphatase: 71 U/L (ref 38–126)
Anion gap: 5 (ref 5–15)
BUN: 30 mg/dL — ABNORMAL HIGH (ref 8–23)
CO2: 31 mmol/L (ref 22–32)
Calcium: 9 mg/dL (ref 8.9–10.3)
Chloride: 104 mmol/L (ref 98–111)
Creatinine: 0.84 mg/dL (ref 0.61–1.24)
GFR, Est AFR Am: >60 mL/min (ref >60)
GFR, Estimated: >60 mL/min (ref >60)
Glucose, Bld: 96 mg/dL (ref 70–99)
Potassium: 4.9 mmol/L (ref 3.5–5.1)
Sodium: 140 mmol/L (ref 135–145)
Total Bilirubin: 0.7 mg/dL (ref 0.3–1.2)
Total Protein: 7.1 g/dL (ref 6.5–8.1)

## 2019-06-26 LAB — CBC WITH DIFFERENTIAL (CANCER CENTER ONLY)
Abs Immature Granulocytes: 0.01 10*3/uL (ref 0.00–0.07)
Basophils Absolute: 0 10*3/uL (ref 0.0–0.1)
Basophils Relative: 0 %
Eosinophils Absolute: 0 10*3/uL (ref 0.0–0.5)
Eosinophils Relative: 0 %
HCT: 36.8 % — ABNORMAL LOW (ref 39.0–52.0)
Hemoglobin: 12.7 g/dL — ABNORMAL LOW (ref 13.0–17.0)
Immature Granulocytes: 0 %
Lymphocytes Relative: 11 %
Lymphs Abs: 0.4 10*3/uL — ABNORMAL LOW (ref 0.7–4.0)
MCH: 32.6 pg (ref 26.0–34.0)
MCHC: 34.5 g/dL (ref 30.0–36.0)
MCV: 94.4 fL (ref 80.0–100.0)
Monocytes Absolute: 0.2 10*3/uL (ref 0.1–1.0)
Monocytes Relative: 6 %
Neutro Abs: 3.1 10*3/uL (ref 1.7–7.7)
Neutrophils Relative %: 83 %
Platelet Count: 175 10*3/uL (ref 150–400)
RBC: 3.9 MIL/uL — ABNORMAL LOW (ref 4.22–5.81)
RDW: 15.8 % — ABNORMAL HIGH (ref 11.5–15.5)
WBC Count: 3.8 10*3/uL — ABNORMAL LOW (ref 4.0–10.5)
nRBC: 0 % (ref 0.0–0.2)

## 2019-06-26 MED ORDER — BORTEZOMIB CHEMO SQ INJECTION 3.5 MG (2.5MG/ML)
1.5000 mg/m2 | Freq: Once | INTRAMUSCULAR | Status: AC
  Administered 2019-06-26: 2.75 mg via SUBCUTANEOUS
  Filled 2019-06-26: qty 1.1

## 2019-06-26 NOTE — Patient Instructions (Signed)
Bortezomib injection What is this medicine? BORTEZOMIB (bor TEZ oh mib) is a medicine that targets proteins in cancer cells and stops the cancer cells from growing. It is used to treat multiple myeloma and mantle-cell lymphoma. This medicine may be used for other purposes; ask your health care provider or pharmacist if you have questions. COMMON BRAND NAME(S): Velcade What should I tell my health care provider before I take this medicine? They need to know if you have any of these conditions:  diabetes  heart disease  irregular heartbeat  liver disease  on hemodialysis  low blood counts, like low white blood cells, platelets, or hemoglobin  peripheral neuropathy  taking medicine for blood pressure  an unusual or allergic reaction to bortezomib, mannitol, boron, other medicines, foods, dyes, or preservatives  pregnant or trying to get pregnant  breast-feeding How should I use this medicine? This medicine is for injection into a vein or for injection under the skin. It is given by a health care professional in a hospital or clinic setting. Talk to your pediatrician regarding the use of this medicine in children. Special care may be needed. Overdosage: If you think you have taken too much of this medicine contact a poison control center or emergency room at once. NOTE: This medicine is only for you. Do not share this medicine with others. What if I miss a dose? It is important not to miss your dose. Call your doctor or health care professional if you are unable to keep an appointment. What may interact with this medicine? This medicine may interact with the following medications:  ketoconazole  rifampin  ritonavir  St. John's Wort This list may not describe all possible interactions. Give your health care provider a list of all the medicines, herbs, non-prescription drugs, or dietary supplements you use. Also tell them if you smoke, drink alcohol, or use illegal drugs. Some  items may interact with your medicine. What should I watch for while using this medicine? You may get drowsy or dizzy. Do not drive, use machinery, or do anything that needs mental alertness until you know how this medicine affects you. Do not stand or sit up quickly, especially if you are an older patient. This reduces the risk of dizzy or fainting spells. In some cases, you may be given additional medicines to help with side effects. Follow all directions for their use. Call your doctor or health care professional for advice if you get a fever, chills or sore throat, or other symptoms of a cold or flu. Do not treat yourself. This drug decreases your body's ability to fight infections. Try to avoid being around people who are sick. This medicine may increase your risk to bruise or bleed. Call your doctor or health care professional if you notice any unusual bleeding. You may need blood work done while you are taking this medicine. In some patients, this medicine may cause a serious brain infection that may cause death. If you have any problems seeing, thinking, speaking, walking, or standing, tell your doctor right away. If you cannot reach your doctor, urgently seek other source of medical care. Check with your doctor or health care professional if you get an attack of severe diarrhea, nausea and vomiting, or if you sweat a lot. The loss of too much body fluid can make it dangerous for you to take this medicine. Do not become pregnant while taking this medicine or for at least 7 months after stopping it. Women should inform their doctor   if they wish to become pregnant or think they might be pregnant. Men should not father a child while taking this medicine and for at least 4 months after stopping it. There is a potential for serious side effects to an unborn child. Talk to your health care professional or pharmacist for more information. Do not breast-feed an infant while taking this medicine or for 2  months after stopping it. This medicine may interfere with the ability to have a child. You should talk with your doctor or health care professional if you are concerned about your fertility. What side effects may I notice from receiving this medicine? Side effects that you should report to your doctor or health care professional as soon as possible:  allergic reactions like skin rash, itching or hives, swelling of the face, lips, or tongue  breathing problems  changes in hearing  changes in vision  fast, irregular heartbeat  feeling faint or lightheaded, falls  pain, tingling, numbness in the hands or feet  right upper belly pain  seizures  swelling of the ankles, feet, hands  unusual bleeding or bruising  unusually weak or tired  vomiting  yellowing of the eyes or skin Side effects that usually do not require medical attention (report to your doctor or health care professional if they continue or are bothersome):  changes in emotions or moods  constipation  diarrhea  loss of appetite  headache  irritation at site where injected  nausea This list may not describe all possible side effects. Call your doctor for medical advice about side effects. You may report side effects to FDA at 1-800-FDA-1088. Where should I keep my medicine? This drug is given in a hospital or clinic and will not be stored at home. NOTE: This sheet is a summary. It may not cover all possible information. If you have questions about this medicine, talk to your doctor, pharmacist, or health care provider.  2020 Elsevier/Gold Standard (2018-03-18 16:29:31)  

## 2019-07-03 ENCOUNTER — Inpatient Hospital Stay: Payer: 59

## 2019-07-03 ENCOUNTER — Other Ambulatory Visit: Payer: Self-pay

## 2019-07-03 ENCOUNTER — Other Ambulatory Visit: Payer: Self-pay | Admitting: Hematology

## 2019-07-03 VITALS — BP 111/56 | HR 61 | Temp 97.7°F | Resp 17

## 2019-07-03 DIAGNOSIS — C9000 Multiple myeloma not having achieved remission: Secondary | ICD-10-CM

## 2019-07-03 DIAGNOSIS — Z5112 Encounter for antineoplastic immunotherapy: Secondary | ICD-10-CM | POA: Diagnosis not present

## 2019-07-03 DIAGNOSIS — Z7189 Other specified counseling: Secondary | ICD-10-CM

## 2019-07-03 LAB — CMP (CANCER CENTER ONLY)
ALT: 22 U/L (ref 0–44)
AST: 22 U/L (ref 15–41)
Albumin: 3.9 g/dL (ref 3.5–5.0)
Alkaline Phosphatase: 78 U/L (ref 38–126)
Anion gap: 5 (ref 5–15)
BUN: 27 mg/dL — ABNORMAL HIGH (ref 8–23)
CO2: 29 mmol/L (ref 22–32)
Calcium: 9.2 mg/dL (ref 8.9–10.3)
Chloride: 104 mmol/L (ref 98–111)
Creatinine: 0.85 mg/dL (ref 0.61–1.24)
GFR, Est AFR Am: >60 mL/min (ref >60)
GFR, Estimated: >60 mL/min (ref >60)
Glucose, Bld: 110 mg/dL — ABNORMAL HIGH (ref 70–99)
Potassium: 4.8 mmol/L (ref 3.5–5.1)
Sodium: 138 mmol/L (ref 135–145)
Total Bilirubin: 0.5 mg/dL (ref 0.3–1.2)
Total Protein: 7.2 g/dL (ref 6.5–8.1)

## 2019-07-03 LAB — CBC WITH DIFFERENTIAL (CANCER CENTER ONLY)
Abs Immature Granulocytes: 0.02 10*3/uL (ref 0.00–0.07)
Basophils Absolute: 0 10*3/uL (ref 0.0–0.1)
Basophils Relative: 0 %
Eosinophils Absolute: 0 10*3/uL (ref 0.0–0.5)
Eosinophils Relative: 0 %
HCT: 38.3 % — ABNORMAL LOW (ref 39.0–52.0)
Hemoglobin: 13.2 g/dL (ref 13.0–17.0)
Immature Granulocytes: 1 %
Lymphocytes Relative: 8 %
Lymphs Abs: 0.3 10*3/uL — ABNORMAL LOW (ref 0.7–4.0)
MCH: 32.7 pg (ref 26.0–34.0)
MCHC: 34.5 g/dL (ref 30.0–36.0)
MCV: 94.8 fL (ref 80.0–100.0)
Monocytes Absolute: 0.1 10*3/uL (ref 0.1–1.0)
Monocytes Relative: 4 %
Neutro Abs: 3 10*3/uL (ref 1.7–7.7)
Neutrophils Relative %: 87 %
Platelet Count: 168 10*3/uL (ref 150–400)
RBC: 4.04 MIL/uL — ABNORMAL LOW (ref 4.22–5.81)
RDW: 15.5 % (ref 11.5–15.5)
WBC Count: 3.4 10*3/uL — ABNORMAL LOW (ref 4.0–10.5)
nRBC: 0 % (ref 0.0–0.2)

## 2019-07-03 MED ORDER — SODIUM CHLORIDE 0.9 % IV SOLN
Freq: Once | INTRAVENOUS | Status: AC
  Administered 2019-07-03: 13:00:00 via INTRAVENOUS
  Filled 2019-07-03: qty 250

## 2019-07-03 MED ORDER — ZOLEDRONIC ACID 4 MG/100ML IV SOLN
4.0000 mg | Freq: Once | INTRAVENOUS | Status: AC
  Administered 2019-07-03: 4 mg via INTRAVENOUS
  Filled 2019-07-03: qty 100

## 2019-07-03 MED ORDER — BORTEZOMIB CHEMO SQ INJECTION 3.5 MG (2.5MG/ML)
1.5000 mg/m2 | Freq: Once | INTRAMUSCULAR | Status: AC
  Administered 2019-07-03: 2.75 mg via SUBCUTANEOUS
  Filled 2019-07-03: qty 1.1

## 2019-07-03 NOTE — Patient Instructions (Signed)
Bortezomib injection What is this medicine? BORTEZOMIB (bor TEZ oh mib) is a medicine that targets proteins in cancer cells and stops the cancer cells from growing. It is used to treat multiple myeloma and mantle-cell lymphoma. This medicine may be used for other purposes; ask your health care provider or pharmacist if you have questions. COMMON BRAND NAME(S): Velcade What should I tell my health care provider before I take this medicine? They need to know if you have any of these conditions:  diabetes  heart disease  irregular heartbeat  liver disease  on hemodialysis  low blood counts, like low white blood cells, platelets, or hemoglobin  peripheral neuropathy  taking medicine for blood pressure  an unusual or allergic reaction to bortezomib, mannitol, boron, other medicines, foods, dyes, or preservatives  pregnant or trying to get pregnant  breast-feeding How should I use this medicine? This medicine is for injection into a vein or for injection under the skin. It is given by a health care professional in a hospital or clinic setting. Talk to your pediatrician regarding the use of this medicine in children. Special care may be needed. Overdosage: If you think you have taken too much of this medicine contact a poison control center or emergency room at once. NOTE: This medicine is only for you. Do not share this medicine with others. What if I miss a dose? It is important not to miss your dose. Call your doctor or health care professional if you are unable to keep an appointment. What may interact with this medicine? This medicine may interact with the following medications:  ketoconazole  rifampin  ritonavir  St. John's Wort This list may not describe all possible interactions. Give your health care provider a list of all the medicines, herbs, non-prescription drugs, or dietary supplements you use. Also tell them if you smoke, drink alcohol, or use illegal drugs. Some  items may interact with your medicine. What should I watch for while using this medicine? You may get drowsy or dizzy. Do not drive, use machinery, or do anything that needs mental alertness until you know how this medicine affects you. Do not stand or sit up quickly, especially if you are an older patient. This reduces the risk of dizzy or fainting spells. In some cases, you may be given additional medicines to help with side effects. Follow all directions for their use. Call your doctor or health care professional for advice if you get a fever, chills or sore throat, or other symptoms of a cold or flu. Do not treat yourself. This drug decreases your body's ability to fight infections. Try to avoid being around people who are sick. This medicine may increase your risk to bruise or bleed. Call your doctor or health care professional if you notice any unusual bleeding. You may need blood work done while you are taking this medicine. In some patients, this medicine may cause a serious brain infection that may cause death. If you have any problems seeing, thinking, speaking, walking, or standing, tell your doctor right away. If you cannot reach your doctor, urgently seek other source of medical care. Check with your doctor or health care professional if you get an attack of severe diarrhea, nausea and vomiting, or if you sweat a lot. The loss of too much body fluid can make it dangerous for you to take this medicine. Do not become pregnant while taking this medicine or for at least 7 months after stopping it. Women should inform their doctor   if they wish to become pregnant or think they might be pregnant. Men should not father a child while taking this medicine and for at least 4 months after stopping it. There is a potential for serious side effects to an unborn child. Talk to your health care professional or pharmacist for more information. Do not breast-feed an infant while taking this medicine or for 2  months after stopping it. This medicine may interfere with the ability to have a child. You should talk with your doctor or health care professional if you are concerned about your fertility. What side effects may I notice from receiving this medicine? Side effects that you should report to your doctor or health care professional as soon as possible:  allergic reactions like skin rash, itching or hives, swelling of the face, lips, or tongue  breathing problems  changes in hearing  changes in vision  fast, irregular heartbeat  feeling faint or lightheaded, falls  pain, tingling, numbness in the hands or feet  right upper belly pain  seizures  swelling of the ankles, feet, hands  unusual bleeding or bruising  unusually weak or tired  vomiting  yellowing of the eyes or skin Side effects that usually do not require medical attention (report to your doctor or health care professional if they continue or are bothersome):  changes in emotions or moods  constipation  diarrhea  loss of appetite  headache  irritation at site where injected  nausea This list may not describe all possible side effects. Call your doctor for medical advice about side effects. You may report side effects to FDA at 1-800-FDA-1088. Where should I keep my medicine? This drug is given in a hospital or clinic and will not be stored at home. NOTE: This sheet is a summary. It may not cover all possible information. If you have questions about this medicine, talk to your doctor, pharmacist, or health care provider.  2020 Elsevier/Gold Standard (2018-03-18 16:29:31) Zoledronic Acid injection (Hypercalcemia, Oncology) What is this medicine? ZOLEDRONIC ACID (ZOE le dron ik AS id) lowers the amount of calcium loss from bone. It is used to treat too much calcium in your blood from cancer. It is also used to prevent complications of cancer that has spread to the bone. This medicine may be used for other  purposes; ask your health care provider or pharmacist if you have questions. COMMON BRAND NAME(S): Zometa What should I tell my health care provider before I take this medicine? They need to know if you have any of these conditions:  aspirin-sensitive asthma  cancer, especially if you are receiving medicines used to treat cancer  dental disease or wear dentures  infection  kidney disease  receiving corticosteroids like dexamethasone or prednisone  an unusual or allergic reaction to zoledronic acid, other medicines, foods, dyes, or preservatives  pregnant or trying to get pregnant  breast-feeding How should I use this medicine? This medicine is for infusion into a vein. It is given by a health care professional in a hospital or clinic setting. Talk to your pediatrician regarding the use of this medicine in children. Special care may be needed. Overdosage: If you think you have taken too much of this medicine contact a poison control center or emergency room at once. NOTE: This medicine is only for you. Do not share this medicine with others. What if I miss a dose? It is important not to miss your dose. Call your doctor or health care professional if you are unable  to keep an appointment. What may interact with this medicine?  certain antibiotics given by injection  NSAIDs, medicines for pain and inflammation, like ibuprofen or naproxen  some diuretics like bumetanide, furosemide  teriparatide  thalidomide This list may not describe all possible interactions. Give your health care provider a list of all the medicines, herbs, non-prescription drugs, or dietary supplements you use. Also tell them if you smoke, drink alcohol, or use illegal drugs. Some items may interact with your medicine. What should I watch for while using this medicine? Visit your doctor or health care professional for regular checkups. It may be some time before you see the benefit from this medicine. Do not  stop taking your medicine unless your doctor tells you to. Your doctor may order blood tests or other tests to see how you are doing. Women should inform their doctor if they wish to become pregnant or think they might be pregnant. There is a potential for serious side effects to an unborn child. Talk to your health care professional or pharmacist for more information. You should make sure that you get enough calcium and vitamin D while you are taking this medicine. Discuss the foods you eat and the vitamins you take with your health care professional. Some people who take this medicine have severe bone, joint, and/or muscle pain. This medicine may also increase your risk for jaw problems or a broken thigh bone. Tell your doctor right away if you have severe pain in your jaw, bones, joints, or muscles. Tell your doctor if you have any pain that does not go away or that gets worse. Tell your dentist and dental surgeon that you are taking this medicine. You should not have major dental surgery while on this medicine. See your dentist to have a dental exam and fix any dental problems before starting this medicine. Take good care of your teeth while on this medicine. Make sure you see your dentist for regular follow-up appointments. What side effects may I notice from receiving this medicine? Side effects that you should report to your doctor or health care professional as soon as possible:  allergic reactions like skin rash, itching or hives, swelling of the face, lips, or tongue  anxiety, confusion, or depression  breathing problems  changes in vision  eye pain  feeling faint or lightheaded, falls  jaw pain, especially after dental work  mouth sores  muscle cramps, stiffness, or weakness  redness, blistering, peeling or loosening of the skin, including inside the mouth  trouble passing urine or change in the amount of urine Side effects that usually do not require medical attention (report  to your doctor or health care professional if they continue or are bothersome):  bone, joint, or muscle pain  constipation  diarrhea  fever  hair loss  irritation at site where injected  loss of appetite  nausea, vomiting  stomach upset  trouble sleeping  trouble swallowing  weak or tired This list may not describe all possible side effects. Call your doctor for medical advice about side effects. You may report side effects to FDA at 1-800-FDA-1088. Where should I keep my medicine? This drug is given in a hospital or clinic and will not be stored at home. NOTE: This sheet is a summary. It may not cover all possible information. If you have questions about this medicine, talk to your doctor, pharmacist, or health care provider.  2020 Elsevier/Gold Standard (2014-04-04 14:19:39)

## 2019-07-10 ENCOUNTER — Inpatient Hospital Stay: Payer: 59

## 2019-07-10 ENCOUNTER — Other Ambulatory Visit: Payer: Self-pay

## 2019-07-10 VITALS — BP 123/61 | HR 66 | Temp 97.1°F | Resp 18

## 2019-07-10 DIAGNOSIS — C9000 Multiple myeloma not having achieved remission: Secondary | ICD-10-CM

## 2019-07-10 DIAGNOSIS — Z7189 Other specified counseling: Secondary | ICD-10-CM

## 2019-07-10 DIAGNOSIS — Z5112 Encounter for antineoplastic immunotherapy: Secondary | ICD-10-CM | POA: Diagnosis not present

## 2019-07-10 LAB — CBC WITH DIFFERENTIAL (CANCER CENTER ONLY)
Abs Immature Granulocytes: 0.01 10*3/uL (ref 0.00–0.07)
Basophils Absolute: 0 10*3/uL (ref 0.0–0.1)
Basophils Relative: 1 %
Eosinophils Absolute: 0 10*3/uL (ref 0.0–0.5)
Eosinophils Relative: 0 %
HCT: 35.9 % — ABNORMAL LOW (ref 39.0–52.0)
Hemoglobin: 12.4 g/dL — ABNORMAL LOW (ref 13.0–17.0)
Immature Granulocytes: 0 %
Lymphocytes Relative: 8 %
Lymphs Abs: 0.3 10*3/uL — ABNORMAL LOW (ref 0.7–4.0)
MCH: 32.8 pg (ref 26.0–34.0)
MCHC: 34.5 g/dL (ref 30.0–36.0)
MCV: 95 fL (ref 80.0–100.0)
Monocytes Absolute: 0.2 10*3/uL (ref 0.1–1.0)
Monocytes Relative: 5 %
Neutro Abs: 2.8 10*3/uL (ref 1.7–7.7)
Neutrophils Relative %: 86 %
Platelet Count: 178 10*3/uL (ref 150–400)
RBC: 3.78 MIL/uL — ABNORMAL LOW (ref 4.22–5.81)
RDW: 15.5 % (ref 11.5–15.5)
WBC Count: 3.2 10*3/uL — ABNORMAL LOW (ref 4.0–10.5)
nRBC: 0 % (ref 0.0–0.2)

## 2019-07-10 LAB — CMP (CANCER CENTER ONLY)
ALT: 19 U/L (ref 0–44)
AST: 21 U/L (ref 15–41)
Albumin: 3.6 g/dL (ref 3.5–5.0)
Alkaline Phosphatase: 69 U/L (ref 38–126)
Anion gap: 5 (ref 5–15)
BUN: 26 mg/dL — ABNORMAL HIGH (ref 8–23)
CO2: 28 mmol/L (ref 22–32)
Calcium: 8.7 mg/dL — ABNORMAL LOW (ref 8.9–10.3)
Chloride: 104 mmol/L (ref 98–111)
Creatinine: 0.86 mg/dL (ref 0.61–1.24)
GFR, Est AFR Am: >60 mL/min (ref >60)
GFR, Estimated: >60 mL/min (ref >60)
Glucose, Bld: 112 mg/dL — ABNORMAL HIGH (ref 70–99)
Potassium: 4.3 mmol/L (ref 3.5–5.1)
Sodium: 137 mmol/L (ref 135–145)
Total Bilirubin: 0.5 mg/dL (ref 0.3–1.2)
Total Protein: 6.8 g/dL (ref 6.5–8.1)

## 2019-07-10 MED ORDER — BORTEZOMIB CHEMO SQ INJECTION 3.5 MG (2.5MG/ML)
1.5000 mg/m2 | Freq: Once | INTRAMUSCULAR | Status: AC
  Administered 2019-07-10: 13:00:00 2.75 mg via SUBCUTANEOUS
  Filled 2019-07-10: qty 1.1

## 2019-07-10 NOTE — Patient Instructions (Signed)
[facility] Discharge Instructions for Patients Receiving Chemotherapy  Today you received the following chemotherapy agents Velcade To help prevent nausea and vomiting after your treatment, we encourage you to take your nausea medication as prescribed.   If you develop nausea and vomiting that is not controlled by your nausea medication, call the clinic.   BELOW ARE SYMPTOMS THAT SHOULD BE REPORTED IMMEDIATELY:  *FEVER GREATER THAN 100.5 F  *CHILLS WITH OR WITHOUT FEVER  NAUSEA AND VOMITING THAT IS NOT CONTROLLED WITH YOUR NAUSEA MEDICATION  *UNUSUAL SHORTNESS OF BREATH  *UNUSUAL BRUISING OR BLEEDING  TENDERNESS IN MOUTH AND THROAT WITH OR WITHOUT PRESENCE OF ULCERS  *URINARY PROBLEMS  *BOWEL PROBLEMS  UNUSUAL RASH Items with * indicate a potential emergency and should be followed up as soon as possible.  Feel free to call the clinic should you have any questions or concerns. The clinic phone number is [phone].  Please show the CHEMO ALERT CARD at check-in to the Emergency Department and triage nurse.   

## 2019-07-11 LAB — MULTIPLE MYELOMA PANEL, SERUM
Albumin SerPl Elph-Mcnc: 3.5 g/dL (ref 2.9–4.4)
Albumin/Glob SerPl: 1.1 (ref 0.7–1.7)
Alpha 1: 0.3 g/dL (ref 0.0–0.4)
Alpha2 Glob SerPl Elph-Mcnc: 0.6 g/dL (ref 0.4–1.0)
B-Globulin SerPl Elph-Mcnc: 0.9 g/dL (ref 0.7–1.3)
Gamma Glob SerPl Elph-Mcnc: 1.5 g/dL (ref 0.4–1.8)
Globulin, Total: 3.3 g/dL (ref 2.2–3.9)
IgA: 14 mg/dL — ABNORMAL LOW (ref 61–437)
IgG (Immunoglobin G), Serum: 2152 mg/dL — ABNORMAL HIGH (ref 603–1613)
IgM (Immunoglobulin M), Srm: 20 mg/dL (ref 20–172)
M Protein SerPl Elph-Mcnc: 1.4 g/dL — ABNORMAL HIGH
Total Protein ELP: 6.8 g/dL (ref 6.0–8.5)

## 2019-07-11 LAB — KAPPA/LAMBDA LIGHT CHAINS
Kappa free light chain: 8 mg/L (ref 3.3–19.4)
Kappa, lambda light chain ratio: 2.35 — ABNORMAL HIGH (ref 0.26–1.65)
Lambda free light chains: 3.4 mg/L — ABNORMAL LOW (ref 5.7–26.3)

## 2019-07-17 ENCOUNTER — Telehealth: Payer: Self-pay | Admitting: Pharmacy Technician

## 2019-07-17 ENCOUNTER — Telehealth: Payer: Self-pay | Admitting: Pharmacist

## 2019-07-17 ENCOUNTER — Other Ambulatory Visit: Payer: 59

## 2019-07-17 ENCOUNTER — Inpatient Hospital Stay: Payer: 59

## 2019-07-17 ENCOUNTER — Other Ambulatory Visit: Payer: Self-pay

## 2019-07-17 ENCOUNTER — Other Ambulatory Visit: Payer: Self-pay | Admitting: *Deleted

## 2019-07-17 ENCOUNTER — Encounter: Payer: Self-pay | Admitting: Hematology

## 2019-07-17 ENCOUNTER — Ambulatory Visit: Payer: 59

## 2019-07-17 ENCOUNTER — Inpatient Hospital Stay (HOSPITAL_BASED_OUTPATIENT_CLINIC_OR_DEPARTMENT_OTHER): Payer: 59 | Admitting: Hematology

## 2019-07-17 ENCOUNTER — Other Ambulatory Visit: Payer: Self-pay | Admitting: Hematology

## 2019-07-17 VITALS — BP 110/62 | HR 75 | Temp 98.2°F | Resp 18 | Ht 69.0 in | Wt 169.0 lb

## 2019-07-17 DIAGNOSIS — T451X5A Adverse effect of antineoplastic and immunosuppressive drugs, initial encounter: Secondary | ICD-10-CM

## 2019-07-17 DIAGNOSIS — C9000 Multiple myeloma not having achieved remission: Secondary | ICD-10-CM | POA: Diagnosis not present

## 2019-07-17 DIAGNOSIS — D701 Agranulocytosis secondary to cancer chemotherapy: Secondary | ICD-10-CM | POA: Diagnosis not present

## 2019-07-17 DIAGNOSIS — Z5112 Encounter for antineoplastic immunotherapy: Secondary | ICD-10-CM | POA: Diagnosis not present

## 2019-07-17 DIAGNOSIS — D6481 Anemia due to antineoplastic chemotherapy: Secondary | ICD-10-CM | POA: Diagnosis not present

## 2019-07-17 DIAGNOSIS — Z7189 Other specified counseling: Secondary | ICD-10-CM

## 2019-07-17 LAB — CBC WITH DIFFERENTIAL (CANCER CENTER ONLY)
Abs Immature Granulocytes: 0.01 10*3/uL (ref 0.00–0.07)
Basophils Absolute: 0 10*3/uL (ref 0.0–0.1)
Basophils Relative: 0 %
Eosinophils Absolute: 0 10*3/uL (ref 0.0–0.5)
Eosinophils Relative: 1 %
HCT: 36.1 % — ABNORMAL LOW (ref 39.0–52.0)
Hemoglobin: 12.6 g/dL — ABNORMAL LOW (ref 13.0–17.0)
Immature Granulocytes: 0 %
Lymphocytes Relative: 23 %
Lymphs Abs: 0.8 10*3/uL (ref 0.7–4.0)
MCH: 33.2 pg (ref 26.0–34.0)
MCHC: 34.9 g/dL (ref 30.0–36.0)
MCV: 95.3 fL (ref 80.0–100.0)
Monocytes Absolute: 0.4 10*3/uL (ref 0.1–1.0)
Monocytes Relative: 13 %
Neutro Abs: 2.1 10*3/uL (ref 1.7–7.7)
Neutrophils Relative %: 63 %
Platelet Count: 189 10*3/uL (ref 150–400)
RBC: 3.79 MIL/uL — ABNORMAL LOW (ref 4.22–5.81)
RDW: 15.4 % (ref 11.5–15.5)
WBC Count: 3.3 10*3/uL — ABNORMAL LOW (ref 4.0–10.5)
nRBC: 0 % (ref 0.0–0.2)

## 2019-07-17 LAB — CMP (CANCER CENTER ONLY)
ALT: 19 U/L (ref 0–44)
AST: 23 U/L (ref 15–41)
Albumin: 3.7 g/dL (ref 3.5–5.0)
Alkaline Phosphatase: 73 U/L (ref 38–126)
Anion gap: 5 (ref 5–15)
BUN: 25 mg/dL — ABNORMAL HIGH (ref 8–23)
CO2: 27 mmol/L (ref 22–32)
Calcium: 8.5 mg/dL — ABNORMAL LOW (ref 8.9–10.3)
Chloride: 106 mmol/L (ref 98–111)
Creatinine: 0.8 mg/dL (ref 0.61–1.24)
GFR, Est AFR Am: >60 mL/min (ref >60)
GFR, Estimated: >60 mL/min (ref >60)
Glucose, Bld: 103 mg/dL — ABNORMAL HIGH (ref 70–99)
Potassium: 4.3 mmol/L (ref 3.5–5.1)
Sodium: 138 mmol/L (ref 135–145)
Total Bilirubin: 0.4 mg/dL (ref 0.3–1.2)
Total Protein: 7.3 g/dL (ref 6.5–8.1)

## 2019-07-17 MED ORDER — LIDOCAINE-PRILOCAINE 2.5-2.5 % EX CREA: TOPICAL_CREAM | CUTANEOUS | 3 refills | Status: DC

## 2019-07-17 MED ORDER — DEXAMETHASONE 4 MG PO TABS: ORAL_TABLET | ORAL | 11 refills | Status: DC

## 2019-07-17 MED ORDER — POMALIDOMIDE 4 MG PO CAPS: 4.0000 mg | ORAL_CAPSULE | Freq: Every day | ORAL | 0 refills | Status: DC

## 2019-07-17 MED ORDER — BORTEZOMIB CHEMO SQ INJECTION 3.5 MG (2.5MG/ML)
1.5000 mg/m2 | Freq: Once | INTRAMUSCULAR | Status: AC
  Administered 2019-07-17: 2.75 mg via SUBCUTANEOUS
  Filled 2019-07-17: qty 1.1

## 2019-07-17 NOTE — Progress Notes (Signed)
DISCONTINUE ON PATHWAY REGIMEN - Multiple Myeloma and Other Plasma Cell Dyscrasias     A cycle is every 28 days:     Bortezomib      Cyclophosphamide      Dexamethasone   **Always confirm dose/schedule in your pharmacy ordering system**  REASON: Disease Progression PRIOR TREATMENT: [id number]: CyBorD (Cyclophosphamide (Oral) + Bortezomib 1.5 mg/m2 Subcut D1, 8, 15, 22 + Dexamethasone) q28 Days x 4 Cycles TREATMENT RESPONSE: Progressive Disease (PD)  START ON PATHWAY REGIMEN - Multiple Myeloma and Other Plasma Cell Dyscrasias     Cycle 1: A cycle is 28 days:     Pomalidomide      Dexamethasone      Daratumumab      Daratumumab    Cycle 2: A cycle is 28 days:     Pomalidomide      Dexamethasone      Daratumumab    Cycles 3 through 6: A cycle is every 28 days:     Pomalidomide      Dexamethasone      Dexamethasone      Daratumumab    Cycles 7 and beyond: A cycle is every 28 days:     Pomalidomide      Dexamethasone      Dexamethasone      Daratumumab   **Always confirm dose/schedule in your pharmacy ordering system**  Patient Characteristics: Relapsed / Refractory, Second through Fourth Lines of Therapy R-ISS Staging: Unknown Disease Classification: Refractory Line of Therapy: Third Line Intent of Therapy: Non-Curative / Palliative Intent, Discussed with Patient

## 2019-07-17 NOTE — Progress Notes (Signed)
[place] OFFICE PROGRESS NOTE  Patient Care Team: Patient, No Pcp Per as PCP - General (General Practice) Cordelia Poche, RN as Oncology Nurse Navigator [name], MD as Medical Oncologist (Hematology)  HEME/ONC OVERVIEW: 1. 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 and DS  -10/2018: MRI of the right shoulder for arm pain showed innumerable enhancing lesions throughout the right humerus, ribs and right scapula; no fractures -11/2018: baseline labs  Hgb 10, Ca 12.2, Cr 2.89, albumin 2.9  M-spike 3.7 g/dL, free lambda 7.8, monoclonal IgG lambda on IFE, quant IgG 5602, LDH 180, B2M 6.8  PET showed innumerable lytic lesions involving the axial and proximal appendicular skeleton and calvarium; no plasmacytoma   BM bx: normocellular marrow with plasma cell neoplasm (~64% of all cells); myeloma FISH showed trisomy 11 and gain of ATM gene (standard risk) -12/2018 - 04/2019: 1st line q21d RVd, PR   Pre-BMT bone marrow eval showed hypocellular marrow with persistent 10-15% lambda-restricted plasma cells  -05/2019 - 06/2019: 2nd line CyBorD, PD   Disease progression with rising M-spike from 1.1 to 1.4g/dL and quant IgG from 1844 to 2152  -07/2019 - present: tentatively DPd   2. 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  -S/p Xgeva x 1 for Ca 12.2; Zometa held due to multiple dental procedures -02/2019: pathologic left humerus fracture -03/2019 - present: q56monthZometa   TREATMENT REGIMEN:  01/09/2019 - 05/08/2019: [id number] x 6 cycles, PR  04/03/2019 - present: q327monthometa; cleared by dentistry  04/08/2019: IMNP for left humerus fracture   05/29/2019 - 07/17/2019: [id number] x 2 cycles, PD   DPd, date to be determined (tentatively 07/29/2019)  ASSESSMENT & PLAN:   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 and DS  -S/p 6 cycles of RVd and 2 cycles of CyBorD  -Unfortunately, MM panel after 2 cycles of CyBorD showed rising  M-spike and quant IgG, consistent w/ disease progression -I have ordered repeat MM panel to confirm these results -While awaiting the MM results, we will proceed with C2D22 of CyBorD today  -I discussed the case in detail [facility] he recommended changing treatment to DPd x 2 cycles, and reassess response  -We discussed the rationale for, as well as some of the potential side effects of DPd, including infusion reactions -Some of the short term side-effects included, though not limited to, risk of fatigue, weight loss, tumor lysis syndrome, risk of allergic reactions, pancytopenia, risk of blood clots, life-threatening infections, need for transfusions of blood products, admission to hospital for various reasons, and risks of death.  -The patient is aware that the response rates discussed earlier is not guaranteed.   -After a long discussion, patient made an informed decision to proceed with the prescribed plan of care.  -I have ordered port placement in anticipation of daratumumab  -We will tentatively start DPd in ~2 weeks due to the time needed to obtain pre-authorization (tentatively around 07/29/2019) -Re-assess MM response after 2 cycles of DPd and if improving disease, then we can proceed with BMT  -VZV prophylaxis: acyclovir -PRN anti-emetics; Zofran and Compazine   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  -S/p intramedullary nail placement for pathologic left humerus fracture on 04/08/2019 -q3m26monthmeta resumed in 03/2019 after dental clearance; next due in mid-09/2019 -On Ca-Vit D supplement BID   Chemotherapy-associated leukopenia -Secondary to chemotherapy -WBC 3.3k with [place],  stable  -Patient denies any symptoms of infection -We will monitor for now  Chemotherapy-associated anemia -Secondary to chemotherapy -Hgb 12.6, stable  -Patient denies any symptom of bleeding -We will  monitor for now  Hypocalcemia -Secondary to Zometa -Ca 8.5, slightly lower -Patient is asymptomatic  -Continue Ca-Vit D supplement  Orders Placed This Encounter  Procedures  . IR IMAGING GUIDED PORT INSERTION    Standing Status:   Future    Standing Expiration Date:   09/15/2020    Order Specific Question:   Reason for Exam (SYMPTOM  OR DIAGNOSIS REQUIRED)    Answer:   chemo access for daratumumab    Order Specific Question:   Preferred Imaging Location?    Answer:   [facility]  . Pretreatment RBC phenotype    Obtain prior to daratumumab treatment.    Standing Status:   Future    Standing Expiration Date:   07/27/2020  . CBC with Differential (Cancer Center Only)    Standing Status:   Standing    Number of Occurrences:   20    Standing Expiration Date:   07/16/2020  . CMP ([place] only)    Standing Status:   Standing    Number of Occurrences:   20    Standing Expiration Date:   07/16/2020  . PHYSICIAN COMMUNICATION ORDER    Order aspirin 81-385m OR Coumadin for thromboembolic prophylaxis via "add orders". Target Coumadin INR = 2-3.  .Marland KitchenType and screen    Obtain prior to daratumumab treatment.    Standing Status:   Future    Standing Expiration Date:   07/16/2020    All questions were answered. The patient knows to call the clinic with any problems, questions or concerns. No barriers to learning was detected.  Return in 2 weeks for labs, port flush, clinic appt and C1D1 of DPd.   YTish Men MD 07/17/2019 1:24 PM  CHIEF COMPLAINT: "I am doing great"  INTERVAL HISTORY: Mr. HAlabireturns to clinic for follow-up of MM on CyBorD.  He reports that he has been tolerating treatment well, and denies any nausea, vomiting, diarrhea, or neuropathy in the hands or feet.  He has an upcoming wedding for his daughter in early 07/2019.  He denies any complaint today.  SUMMARY OF ONCOLOGIC HISTORY: Oncology History  [place]  11/11/2018 Imaging   MRI right  shoulder for arm pain showed innumerable enhancing lesions throughout the right humerus, ribs and right scapula; no fractures   11/29/2018 Miscellaneous   Baseline labs -Hgb 10, Ca 12.2, Cr 2.89, albumin 2.9 -M-spike 3.7 g/dL, free lambda 7.8, monoclonal IgG lambda on IFE, quant IgG 5602, LDH 180, B2M 6.8   01/09/2019 - 05/14/2019 Chemotherapy   The patient had bortezomib SQ (VELCADE) chemo injection 2.5 mg, 1.3 mg/m2 = 2.5 mg, Subcutaneous,  Once, 6 of 10 cycles Administration: 2.5 mg (01/09/2019), 2.5 mg (01/16/2019), 2.5 mg (01/23/2019), 2.5 mg (01/30/2019), 2.5 mg (02/06/2019), 2.5 mg (02/13/2019), 2.5 mg (02/20/2019), 2.5 mg (02/27/2019), 2.5 mg (03/06/2019), 2.5 mg (03/13/2019), 2.5 mg (03/20/2019), 2.5 mg (03/27/2019), 2.5 mg (04/03/2019), 2.5 mg (04/10/2019), 2.5 mg (04/17/2019), 2.5 mg (04/24/2019), 2.5 mg (05/01/2019), 2.5 mg (05/08/2019)  for chemotherapy treatment.    05/29/2019 - 07/23/2019 Chemotherapy   The patient had bortezomib SQ (VELCADE) chemo injection 2.75 mg, 1.5 mg/m2 = 2.75 mg, Subcutaneous,  Once, 2 of 4 cycles Administration: 2.75 mg (05/29/2019), 2.75 mg (06/05/2019), 2.75 mg (06/12/2019), 2.75 mg (06/19/2019), 2.75 mg (06/26/2019), 2.75 mg (07/03/2019),  2.75 mg (07/10/2019), 2.75 mg (07/17/2019)  for chemotherapy treatment.    07/29/2019 -  Chemotherapy   The patient had daratumumab (DARZALEX) 620 mg in sodium chloride 0.9 % 469 mL (1.24 mg/mL) chemo infusion, 8 mg/kg = 620 mg, Intravenous, Once, 0 of 7 cycles  for chemotherapy treatment.      REVIEW OF SYSTEMS:   Constitutional: ( - ) fevers, ( - )  chills , ( - ) night sweats Eyes: ( - ) blurriness of vision, ( - ) double vision, ( - ) watery eyes Ears, nose, mouth, throat, and face: ( - ) mucositis, ( - ) sore throat Respiratory: ( - ) cough, ( - ) dyspnea, ( - ) wheezes Cardiovascular: ( - ) palpitation, ( - ) chest discomfort, ( - ) lower extremity swelling Gastrointestinal:  ( - ) nausea, ( - ) heartburn, ( - ) change in bowel habits Skin: ( -  ) abnormal skin rashes Lymphatics: ( - ) new lymphadenopathy, ( - ) easy bruising Neurological: ( - ) numbness, ( - ) tingling, ( - ) new weaknesses Behavioral/Psych: ( - ) mood change, ( - ) new changes  All other systems were reviewed with the patient and are negative.  I have reviewed the past medical history, past surgical history, social history and family history with the patient and they are unchanged from previous note.  ALLERGIES:  has No Known Allergies.  MEDICATIONS:  Current Outpatient Medications  Medication Sig Dispense Refill  . acyclovir (ZOVIRAX) 400 MG tablet Take 1 tablet (400 mg total) by mouth 2 (two) times daily. (Patient taking differently: Take 400 mg by mouth 2 (two) times daily as needed (fever blisters). ) 60 tablet 3  . aspirin EC 81 MG tablet Take 81 mg by mouth daily.    . ondansetron (ZOFRAN ODT) 4 MG disintegrating tablet Take 1 tablet (4 mg total) by mouth every 8 (eight) hours as needed. 20 tablet 0  . prochlorperazine (COMPAZINE) 10 MG tablet Take 1 tablet (10 mg total) by mouth every 6 (six) hours as needed (Nausea or vomiting). 30 tablet 1  . CALCIUM-VITAMIN D PO Take 1 tablet by mouth 2 (two) times a day.    Marland Kitchen dexamethasone (DECADRON) 4 MG tablet Take 5 tablets (33m) on days 1, 8, 15 and 22 of each cycle Take 1 tab (467m for 2 days after each dose of daratumumab 30 tablet 11  . lidocaine-prilocaine (EMLA) cream Apply to affected area once 30 g 3  . LORazepam (ATIVAN) 0.5 MG tablet Take 1 tablet (0.5 mg total) by mouth every 6 (six) hours as needed (Nausea or vomiting). (Patient not taking: Reported on 07/10/2019) 30 tablet 0  . Multiple Vitamin (MULTIVITAMIN WITH MINERALS) TABS Take 1 tablet by mouth daily.    . ondansetron (ZOFRAN) 8 MG tablet Take 1 tablet (8 mg total) by mouth 2 (two) times daily as needed (Nausea or vomiting). (Patient not taking: Reported on 07/10/2019) 30 tablet 1  . pomalidomide (POMALYST) 4 MG capsule Take 1 capsule (4 mg total) by  mouth daily. Take with water on days 1-21. Repeat every 28 days. 21 capsule 0   No current facility-administered medications for this visit.     PHYSICAL EXAMINATION: ECOG PERFORMANCE STATUS: 1 - Symptomatic but completely ambulatory  Today's Vitals   07/17/19 1023  BP: 110/62  Pulse: 75  Resp: 18  Temp: 98.2 F (36.8 C)  TempSrc: Temporal  SpO2: 100%  Weight: 169 lb (76.7 kg)  Height: '5\' 9"'  (1.753 m)  PainSc: 0-No pain   Body mass index is 24.96 kg/m.  Filed Weights   07/17/19 1023  Weight: 169 lb (76.7 kg)    GENERAL: alert, no distress and comfortable SKIN: skin color, texture, turgor are normal, no rashes or significant lesions EYES: conjunctiva are pink and non-injected, sclera clear OROPHARYNX: no exudate, no erythema; lips, buccal mucosa, and tongue normal  NECK: supple, non-tender LUNGS: clear to auscultation with normal breathing effort HEART: regular rate & rhythm and no murmurs and no lower extremity edema ABDOMEN: soft, non-tender, non-distended, normal bowel sounds Musculoskeletal: no cyanosis of digits and no clubbing  PSYCH: alert & oriented x 3, fluent speech NEURO: no focal motor/sensory deficits  LABORATORY DATA:  I have reviewed the data as listed    Component Value Date/Time   NA 138 07/17/2019 0954   K 4.3 07/17/2019 0954   CL 106 07/17/2019 0954   CO2 27 07/17/2019 0954   GLUCOSE 103 (H) 07/17/2019 0954   BUN 25 (H) 07/17/2019 0954   CREATININE 0.80 07/17/2019 0954   CALCIUM 8.5 (L) 07/17/2019 0954   PROT 7.3 07/17/2019 0954   ALBUMIN 3.7 07/17/2019 0954   AST 23 07/17/2019 0954   ALT 19 07/17/2019 0954   ALKPHOS 73 07/17/2019 0954   BILITOT 0.4 07/17/2019 0954   GFRNONAA >60 07/17/2019 0954   GFRAA >60 07/17/2019 0954    No results found for: SPEP, UPEP  Lab Results  Component Value Date   WBC 3.3 (L) 07/17/2019   NEUTROABS 2.1 07/17/2019   HGB 12.6 (L) 07/17/2019   HCT 36.1 (L) 07/17/2019   MCV 95.3 07/17/2019   PLT 189  07/17/2019      Chemistry      Component Value Date/Time   NA 138 07/17/2019 0954   K 4.3 07/17/2019 0954   CL 106 07/17/2019 0954   CO2 27 07/17/2019 0954   BUN 25 (H) 07/17/2019 0954   CREATININE 0.80 07/17/2019 0954      Component Value Date/Time   CALCIUM 8.5 (L) 07/17/2019 0954   ALKPHOS 73 07/17/2019 0954   AST 23 07/17/2019 0954   ALT 19 07/17/2019 0954   BILITOT 0.4 07/17/2019 0954       RADIOGRAPHIC STUDIES: I have personally reviewed the radiological images as listed below and agreed with the findings in the report. No results found.

## 2019-07-17 NOTE — Telephone Encounter (Signed)
Oral Oncology Patient Advocate Encounter  Prior Authorization for Pomalyst has been approved.    PA# [id number] Effective dates: 07/17/2019 through 07/16/2020  Patients co-pay is $0.00.  Oral Oncology Clinic will continue to follow.   [place] Patient [place] Phone [phone] Fax [phone] 07/17/2019 4:09 PM

## 2019-07-17 NOTE — Telephone Encounter (Signed)
Oral Oncology Patient Advocate Encounter  Received notification from [facility] that prior authorization for Pomalyst is required.  PA submitted on CoverMyMeds Key [id number]  Status is pending  Oral Oncology Clinic will continue to follow.  [place] Patient [place] Phone [phone] Fax [phone] 07/17/2019 4:07 PM

## 2019-07-18 LAB — KAPPA/LAMBDA LIGHT CHAINS
Kappa free light chain: 8.2 mg/L (ref 3.3–19.4)
Kappa, lambda light chain ratio: 2.28 — ABNORMAL HIGH (ref 0.26–1.65)
Lambda free light chains: 3.6 mg/L — ABNORMAL LOW (ref 5.7–26.3)

## 2019-07-18 LAB — MULTIPLE MYELOMA PANEL, SERUM
Albumin SerPl Elph-Mcnc: 3.4 g/dL (ref 2.9–4.4)
Albumin/Glob SerPl: 1.1 (ref 0.7–1.7)
Alpha 1: 0.2 g/dL (ref 0.0–0.4)
Alpha2 Glob SerPl Elph-Mcnc: 0.7 g/dL (ref 0.4–1.0)
B-Globulin SerPl Elph-Mcnc: 0.8 g/dL (ref 0.7–1.3)
Gamma Glob SerPl Elph-Mcnc: 1.6 g/dL (ref 0.4–1.8)
Globulin, Total: 3.3 g/dL (ref 2.2–3.9)
IgA: 14 mg/dL — ABNORMAL LOW (ref 61–437)
IgG (Immunoglobin G), Serum: 1996 mg/dL — ABNORMAL HIGH (ref 603–1613)
IgM (Immunoglobulin M), Srm: 22 mg/dL (ref 20–172)
M Protein SerPl Elph-Mcnc: 1.5 g/dL — ABNORMAL HIGH
Total Protein ELP: 6.7 g/dL (ref 6.0–8.5)

## 2019-07-18 MED ORDER — POMALIDOMIDE 4 MG PO CAPS: 4.0000 mg | ORAL_CAPSULE | Freq: Every day | ORAL | 0 refills | Status: DC

## 2019-07-18 NOTE — Telephone Encounter (Signed)
Oral Oncology Pharmacist Encounter  Received new prescription for Pomalyst (pomalidomide) for the treatment of multiple myeloma in conjunction with daratumumab and dexamethasone, planned duration until disease progression or unacceptable drug toxicity.  CMP from 07/17/2019 assessed, no relevant lab abnormalities. Prescription dose and frequency assessed.   Current medication list in Epic reviewed, no relevant DDIs with pomalidomide identified.  Prescription has been e-scribed to the [employer] per his insurance requirement.   Oral Oncology Clinic will continue to follow for insurance authorization, copayment issues, initial counseling and start date.  Darl Pikes, PharmD, BCPS, [facility] Hematology/Oncology Clinical Pharmacist [facility]/AP [facility] [place] [facility] [phone]  07/18/2019 11:39 AM

## 2019-07-21 NOTE — Telephone Encounter (Signed)
Rx sent to [facility] on 07/18/2019.  I will follow up with the pharmacy about the status of the prescription.

## 2019-07-22 ENCOUNTER — Other Ambulatory Visit: Payer: Self-pay | Admitting: Hematology

## 2019-07-22 DIAGNOSIS — C9000 Multiple myeloma not having achieved remission: Secondary | ICD-10-CM

## 2019-07-22 DIAGNOSIS — Z7189 Other specified counseling: Secondary | ICD-10-CM

## 2019-07-22 MED ORDER — MONTELUKAST SODIUM 10 MG PO TABS: 10.0000 mg | ORAL_TABLET | Freq: Every day | ORAL | 6 refills | Status: DC

## 2019-07-24 ENCOUNTER — Other Ambulatory Visit: Payer: Self-pay | Admitting: Physician Assistant

## 2019-07-24 NOTE — Telephone Encounter (Signed)
[facility] scheduled delivery of Revlimid for 07/24/2019.

## 2019-07-25 ENCOUNTER — Other Ambulatory Visit: Payer: Self-pay

## 2019-07-25 ENCOUNTER — Ambulatory Visit (HOSPITAL_COMMUNITY)
Admission: RE | Admit: 2019-07-25 | Discharge: 2019-07-25 | Disposition: A | Discharge status: Home / Self Care | Payer: 59 | Source: Ambulatory Visit | Attending: Hematology | Admitting: Hematology

## 2019-07-25 ENCOUNTER — Encounter (HOSPITAL_COMMUNITY): Payer: Self-pay

## 2019-07-25 DIAGNOSIS — C9000 Multiple myeloma not having achieved remission: Secondary | ICD-10-CM | POA: Diagnosis present

## 2019-07-25 DIAGNOSIS — Z79899 Other long term (current) drug therapy: Secondary | ICD-10-CM | POA: Insufficient documentation

## 2019-07-25 DIAGNOSIS — Z7982 Long term (current) use of aspirin: Secondary | ICD-10-CM | POA: Diagnosis not present

## 2019-07-25 DIAGNOSIS — Z9221 Personal history of antineoplastic chemotherapy: Secondary | ICD-10-CM | POA: Insufficient documentation

## 2019-07-25 HISTORY — PX: IR IMAGING GUIDED PORT INSERTION: IMG5740

## 2019-07-25 LAB — CBC
HCT: 38.3 % — ABNORMAL LOW (ref 39.0–52.0)
Hemoglobin: 13.1 g/dL (ref 13.0–17.0)
MCH: 33.4 pg (ref 26.0–34.0)
MCHC: 34.2 g/dL (ref 30.0–36.0)
MCV: 97.7 fL (ref 80.0–100.0)
Platelets: 191 10*3/uL (ref 150–400)
RBC: 3.92 MIL/uL — ABNORMAL LOW (ref 4.22–5.81)
RDW: 15.1 % (ref 11.5–15.5)
WBC: 3.2 10*3/uL — ABNORMAL LOW (ref 4.0–10.5)
nRBC: 0 % (ref 0.0–0.2)

## 2019-07-25 LAB — PROTIME-INR
INR: 1.1 (ref 0.8–1.2)
Prothrombin Time: 13.8 seconds (ref 11.4–15.2)

## 2019-07-25 MED ORDER — MIDAZOLAM HCL 2 MG/2ML IJ SOLN
INTRAMUSCULAR | Status: AC
  Filled 2019-07-25: qty 4

## 2019-07-25 MED ORDER — SODIUM CHLORIDE 0.9 % IV SOLN
INTRAVENOUS | Status: DC
  Administered 2019-07-25: 13:00:00 via INTRAVENOUS

## 2019-07-25 MED ORDER — LIDOCAINE HCL 1 % IJ SOLN
INTRAMUSCULAR | Status: AC
  Filled 2019-07-25: qty 20

## 2019-07-25 MED ORDER — FENTANYL CITRATE (PF) 100 MCG/2ML IJ SOLN
INTRAMUSCULAR | Status: AC
  Filled 2019-07-25: qty 2

## 2019-07-25 MED ORDER — LIDOCAINE-EPINEPHRINE 1 %-1:100000 IJ SOLN
INTRAMUSCULAR | Status: AC
  Filled 2019-07-25: qty 1

## 2019-07-25 MED ORDER — HEPARIN SOD (PORK) LOCK FLUSH 100 UNIT/ML IV SOLN
INTRAVENOUS | Status: AC | PRN
  Administered 2019-07-25: 500 [IU] via INTRAVENOUS

## 2019-07-25 MED ORDER — LIDOCAINE-EPINEPHRINE 1 %-1:100000 IJ SOLN
INTRAMUSCULAR | Status: AC | PRN
  Administered 2019-07-25: 10 mL

## 2019-07-25 MED ORDER — MIDAZOLAM HCL 2 MG/2ML IJ SOLN
INTRAMUSCULAR | Status: AC | PRN
  Administered 2019-07-25 (×2): 1 mg via INTRAVENOUS

## 2019-07-25 MED ORDER — FENTANYL CITRATE (PF) 100 MCG/2ML IJ SOLN
INTRAMUSCULAR | Status: AC | PRN
  Administered 2019-07-25 (×2): 50 ug via INTRAVENOUS

## 2019-07-25 MED ORDER — LIDOCAINE HCL (PF) 1 % IJ SOLN
INTRAMUSCULAR | Status: AC | PRN
  Administered 2019-07-25: 10 mL

## 2019-07-25 MED ORDER — HEPARIN SOD (PORK) LOCK FLUSH 100 UNIT/ML IV SOLN
INTRAVENOUS | Status: AC
  Filled 2019-07-25: qty 5

## 2019-07-25 MED ORDER — CEFAZOLIN SODIUM-DEXTROSE 2-4 GM/100ML-% IV SOLN
INTRAVENOUS | Status: AC
  Administered 2019-07-25: 2 g via INTRAVENOUS
  Filled 2019-07-25: qty 100

## 2019-07-25 MED ORDER — CEFAZOLIN SODIUM-DEXTROSE 2-4 GM/100ML-% IV SOLN
2.0000 g | Freq: Once | INTRAVENOUS | Status: AC
  Administered 2019-07-25: 15:00:00 2 g via INTRAVENOUS

## 2019-07-25 NOTE — Procedures (Signed)
Interventional Radiology Procedure:   Indications: Multiple myeloma  Procedure: Port placement  Findings: Right jugular port, tip at SVC/RA junction  Complications: None     EBL: Minimal, less than 10 ml  Plan: Discharge in one hour.  Keep port site and incisions dry for at least 24 hours.     Otis Burress R. Carr Shartzer, MD  Pager: [phone]   

## 2019-07-25 NOTE — Consult Note (Signed)
Chief Complaint: Patient was seen in consultation today for Port-A-Cath placement  Referring Physician(s): Zhao,Yan  Supervising Physician: Markus Daft  Patient Status: [facility] - Out-pt  History of Present Illness: Danny Juarez is a 63 y.o. male with history of multiple myeloma, status post chemotherapy, who now presents with disease progression.  He is scheduled today for Port-A-Cath placement for additional chemotherapy.  Past Medical History:  Diagnosis Date  . Anemia    due to chemo   . Cancer ([place])    Multiple myeloma with  Bone Mets  . Leukopenia    chemo related  . PE (pulmonary embolism) 1996    "3 in right lung"  . 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 HUMERUS IM NAIL Left 04/08/2019   Procedure: INTRAMEDULLARY (IM) NAIL HUMERUS;  Surgeon: Nicholes Stairs, MD;  Location: [place];  Service: Orthopedics;  Laterality: Left;  120 mins  . KNEE ARTHROSCOPY Left     Allergies: Patient has no known allergies.  Medications: Prior to Admission medications   Medication Sig Start Date End Date Taking? Authorizing Provider  acyclovir (ZOVIRAX) 400 MG tablet Take 1 tablet (400 mg total) by mouth 2 (two) times daily. Patient taking differently: Take 400 mg by mouth 2 (two) times daily as needed (fever blisters).  01/30/19  Yes [name], MD  aspirin EC 81 MG tablet Take 81 mg by mouth daily.   Yes [provider]  CALCIUM-VITAMIN D PO Take 1 tablet by mouth 2 (two) times a day.   Yes [provider]  Multiple Vitamin (MULTIVITAMIN WITH MINERALS) TABS Take 1 tablet by mouth daily.   Yes [provider]  dexamethasone (DECADRON) 4 MG tablet Take 5 tablets (58m) on days 1, 8, 15 and 22 of each cycle Take 1 tab (417m for 2 days after each dose of daratumumab 07/17/19   ZhTish MenMD  lidocaine-prilocaine (EMLA) cream Apply to affected area once 07/17/19   ZhTish MenMD  LORazepam (ATIVAN)  0.5 MG tablet Take 1 tablet (0.5 mg total) by mouth every 6 (six) hours as needed (Nausea or vomiting). Patient not taking: Reported on 07/10/2019 12/20/18   ZhTish MenMD  montelukast (SINGULAIR) 10 MG tablet Take 1 tablet (10 mg total) by mouth at bedtime. 07/22/19   ZhTish MenMD  ondansetron (ZOFRAN ODT) 4 MG disintegrating tablet Take 1 tablet (4 mg total) by mouth every 8 (eight) hours as needed. 04/08/19   RoNicholes StairsMD  ondansetron (ZOFRAN) 8 MG tablet Take 1 tablet (8 mg total) by mouth 2 (two) times daily as needed (Nausea or vomiting). Patient not taking: Reported on 07/10/2019 12/20/18   ZhTish MenMD  pomalidomide (POMALYST) 4 MG capsule Take 1 capsule (4 mg total) by mouth daily. Take with water on days 1-21. Repeat every 28 days. 07/18/19   ZhTish MenMD  prochlorperazine (COMPAZINE) 10 MG tablet Take 1 tablet (10 mg total) by mouth every 6 (six) hours as needed (Nausea or vomiting). 12/20/18   ZhTish MenMD     History reviewed. No pertinent family history.  Social History   Socioeconomic History  . Marital status: Divorced    Spouse name: Not on file  . Number of children: 1  . Years of education: Not on file  . Highest education level: Not on file  Occupational History  . Not on file  Social Needs  . FiEmergency planning/management officer  strain: Not on file  . Food insecurity    Worry: Not on file    Inability: Not on file  . Transportation needs    Medical: Not on file    Non-medical: Not on file  Tobacco Use  . Smoking status: Never Smoker  . Smokeless tobacco: Never Used  Substance and Sexual Activity  . Alcohol use: Yes    Comment: occassional  . Drug use: No  . Sexual activity: Not on file  Lifestyle  . Physical activity    Days per week: Not on file    Minutes per session: Not on file  . Stress: Not on file  Relationships  . Social Herbalist on phone: Not on file    Gets together: Not on file    Attends religious service: Not on file    Active member  of club or organization: Not on file    Attends meetings of clubs or organizations: Not on file    Relationship status: Not on file  Other Topics Concern  . Not on file  Social History Narrative   Patient is divorced with one daughter that lives in [place], [place].   Patient has never smoked nor used smokeless tobacco.   Patient with occasional use of alcohol.   Patient denies use of illicit drugs.   Patient is a retired Set designer.      Review of Systems he currently denies fever, headache, chest pain, dyspnea, cough, abdominal/back pain, nausea, vomiting or bleeding  Vital Signs: BP 116/82 (BP Location: Right Arm)   Pulse 68   Temp 97.9 F (36.6 C) (Oral)   Resp 18   Ht _0  (1.753 m)   Wt 168 lb (76.2 kg)   SpO2 98%   BMI 24.81 kg/m   Physical Exam awake, alert.  Chest clear to auscultation bilaterally.  Heart with regular rate and rhythm.  Abdomen soft, positive bowel sounds, nontender.  No lower extremity edema.  Imaging: No results found.  Labs:  CBC: Recent Labs    06/26/19 1133 07/03/19 1140 07/10/19 1134 07/17/19 0954  WBC 3.8* 3.4* 3.2* 3.3*  HGB 12.7* 13.2 12.4* 12.6*  HCT 36.8* 38.3* 35.9* 36.1*  PLT 175 168 178 189    COAGS: No results for input(s): INR, APTT in the last 8760 hours.  BMP: Recent Labs    06/26/19 1133 07/03/19 1140 07/10/19 1134 07/17/19 0954  NA 140 138 137 138  K 4.9 4.8 4.3 4.3  CL 104 104 104 106  CO2 _1 GLUCOSE 96 110* 112* 103*  BUN 30* 27* 26* 25*  CALCIUM 9.0 9.2 8.7* 8.5*  CREATININE 0.84 0.85 0.86 0.80  GFRNONAA >60 >60 >60 >60  GFRAA >60 >60 >60 >60    LIVER FUNCTION TESTS: Recent Labs    06/26/19 1133 07/03/19 1140 07/10/19 1134 07/17/19 0954  BILITOT 0.7 0.5 0.5 0.4  AST _2 ALT _3 ALKPHOS 71 78 69 73  PROT 7.1 7.2 6.8 7.3  ALBUMIN 3.8 3.9 3.6 3.7    TUMOR MARKERS: No results for input(s): AFPTM, CEA, CA199, CHROMGRNA in the last 8760  hours.  Assessment and Plan: 63 y.o. male with history of multiple myeloma, status post chemotherapy, who now presents with disease progression.  He is scheduled today for Port-A-Cath placement for additional chemotherapy.Risks and benefits of image guided port-a-catheter placement was discussed with the patient including, but not limited  to bleeding, infection, pneumothorax, or fibrin sheath development and need for additional procedures.  All of the patient's questions were answered, patient is agreeable to proceed. Consent signed and in chart.  LABS PENDING  Thank you for this interesting consult.  I greatly enjoyed meeting Danny Juarez and look forward to participating in their care.  A copy of this report was sent to the requesting provider on this date.  Electronically Signed: D. Rowe Robert, PA-C 07/25/2019, 1:42 PM   I spent a total of 25 minutes  in face to face in clinical consultation, greater than 50% of which was counseling/coordinating care for Port-A-Cath placement

## 2019-07-25 NOTE — Discharge Instructions (Addendum)
Implanted Port Home Guide °An implanted port is a device that is placed under the skin. It is usually placed in the chest. The device can be used to give IV medicine, to take blood, or for dialysis. You may have an implanted port if: °· You need IV medicine that would be irritating to the small veins in your hands or arms. °· You need IV medicines, such as antibiotics, for a long period of time. °· You need IV nutrition for a long period of time. °· You need dialysis. °Having a port means that your health care provider will not need to use the veins in your arms for these procedures. You may have fewer limitations when using a port than you would if you used other types of long-term IVs, and you will likely be able to return to normal activities after your incision heals. °An implanted port has two main parts: °· Reservoir. The reservoir is the part where a needle is inserted to give medicines or draw blood. The reservoir is round. After it is placed, it appears as a small, raised area under your skin. °· Catheter. The catheter is a thin, flexible tube that connects the reservoir to a vein. Medicine that is inserted into the reservoir goes into the catheter and then into the vein. °How is my port accessed? °To access your port: °· A numbing cream may be placed on the skin over the port site. °· Your health care provider will put on a mask and sterile gloves. °· The skin over your port will be cleaned carefully with a germ-killing soap and allowed to dry. °· Your health care provider will gently pinch the port and insert a needle into it. °· Your health care provider will check for a blood return to make sure the port is in the vein and is not clogged. °· If your port needs to remain accessed to get medicine continuously (constant infusion), your health care provider will place a clear bandage (dressing) over the needle site. The dressing and needle will need to be changed every week, or as told by your health care  provider. °What is flushing? °Flushing helps keep the port from getting clogged. Follow instructions from your health care provider about how and when to flush the port. Ports are usually flushed with saline solution or a medicine called heparin. The need for flushing will depend on how the port is used: °· If the port is only used from time to time to give medicines or draw blood, the port may need to be flushed: °? Before and after medicines have been given. °? Before and after blood has been drawn. °? As part of routine maintenance. Flushing may be recommended every 4-6 weeks. °· If a constant infusion is running, the port may not need to be flushed. °· Throw away any syringes in a disposal container that is meant for sharp items (sharps container). You can buy a sharps container from a pharmacy, or you can make one by using an empty hard plastic bottle with a cover. °How long will my port stay implanted? °The port can stay in for as long as your health care provider thinks it is needed. When it is time for the port to come out, a surgery will be done to remove it. The surgery will be similar to the procedure that was done to put the port in. °Follow these instructions at home: ° °· Flush your port as told by your health care provider. °·   If you need an infusion over several days, follow instructions from your health care provider about how to take care of your port site. Make sure you: °? Wash your hands with soap and water before you change your dressing. If soap and water are not available, use alcohol-based hand sanitizer. °? Change your dressing as told by your health care provider. °? Place any used dressings or infusion bags into a plastic bag. Throw that bag in the trash. °? Keep the dressing that covers the needle clean and dry. Do not get it wet. °? Do not use scissors or sharp objects near the tube. °? Keep the tube clamped, unless it is being used. °· Check your port site every day for signs of  infection. Check for: °? Redness, swelling, or pain. °? Fluid or blood. °? Pus or a bad smell. °· Protect the skin around the port site. °? Avoid wearing bra straps that rub or irritate the site. °? Protect the skin around your port from seat belts. Place a soft pad over your chest if needed. °· Bathe or shower as told by your health care provider. The site may get wet as long as you are not actively receiving an infusion. °· Return to your normal activities as told by your health care provider. Ask your health care provider what activities are safe for you. °· Carry a medical alert card or wear a medical alert bracelet at all times. This will let health care providers know that you have an implanted port in case of an emergency. °Get help right away if: °· You have redness, swelling, or pain at the port site. °· You have fluid or blood coming from your port site. °· You have pus or a bad smell coming from the port site. °· You have a fever. °Summary °· Implanted ports are usually placed in the chest for long-term IV access. °· Follow instructions from your health care provider about flushing the port and changing bandages (dressings). °· Take care of the area around your port by avoiding clothing that puts pressure on the area, and by watching for signs of infection. °· Protect the skin around your port from seat belts. Place a soft pad over your chest if needed. °· Get help right away if you have a fever or you have redness, swelling, pain, drainage, or a bad smell at the port site. °This information is not intended to replace advice given to you by your health care provider. Make sure you discuss any questions you have with your health care provider. °Document Released: 11/06/2005 Document Revised: 02/28/2019 Document Reviewed: 12/09/2016 °[employer] Patient Education © 2020 [employer] ° °Implanted Port Insertion, Care After °This sheet gives you information about how to care for yourself after your procedure.  Your health care provider may also give you more specific instructions. If you have problems or questions, contact your health care provider. °What can I expect after the procedure? °After the procedure, it is common to have: °· Discomfort at the port insertion site. °· Bruising on the skin over the port. This should improve over 3-4 days. °Follow these instructions at home: °Port care °· After your port is placed, you will get a manufacturer's information card. The card has information about your port. Keep this card with you at all times. °· Take care of the port as told by your health care provider. Ask your health care provider if you or a family member can get training for taking care   of the port at home. A home health care nurse may also take care of the port. °· Make sure to remember what type of port you have. °Incision care ° °  ° °· Follow instructions from your health care provider about how to take care of your port insertion site. Make sure you: °? Wash your hands with soap and water before and after you change your bandage (dressing). If soap and water are not available, use hand sanitizer. °? Change your dressing as told by your health care provider. °? Leave stitches (sutures), skin glue, or adhesive strips in place. These skin closures may need to stay in place for 2 weeks or longer. If adhesive strip edges start to loosen and curl up, you may trim the loose edges. Do not remove adhesive strips completely unless your health care provider tells you to do that. °· Check your port insertion site every day for signs of infection. Check for: °? Redness, swelling, or pain. °? Fluid or blood. °? Warmth. °? Pus or a bad smell. °Activity °· Return to your normal activities as told by your health care provider. Ask your health care provider what activities are safe for you. °· Do not lift anything that is heavier than 10 lb (4.5 kg), or the limit that you are told, until your health care provider says that it  is safe. °General instructions °· Take over-the-counter and prescription medicines only as told by your health care provider. °· Do not take baths, swim, or use a hot tub until your health care provider approves. Ask your health care provider if you may take showers. You may only be allowed to take sponge baths. °· Do not drive for 24 hours if you were given a sedative during your procedure. °· Wear a medical alert bracelet in case of an emergency. This will tell any health care providers that you have a port. °· Keep all follow-up visits as told by your health care provider. This is important. °Contact a health care provider if: °· You cannot flush your port with saline as directed, or you cannot draw blood from the port. °· You have a fever or chills. °· You have redness, swelling, or pain around your port insertion site. °· You have fluid or blood coming from your port insertion site. °· Your port insertion site feels warm to the touch. °· You have pus or a bad smell coming from the port insertion site. °Get help right away if: °· You have chest pain or shortness of breath. °· You have bleeding from your port that you cannot control. °Summary °· Take care of the port as told by your health care provider. Keep the manufacturer's information card with you at all times. °· Change your dressing as told by your health care provider. °· Contact a health care provider if you have a fever or chills or if you have redness, swelling, or pain around your port insertion site. °· Keep all follow-up visits as told by your health care provider. °This information is not intended to replace advice given to you by your health care provider. Make sure you discuss any questions you have with your health care provider. °Document Released: 08/27/2013 Document Revised: 06/04/2018 Document Reviewed: 06/04/2018 °[employer]vier Patient Education © 2020 [employer] ° ° ° ° ° °Moderate Conscious Sedation, Adult, Care After °These instructions  provide you with information about caring for yourself after your procedure. Your health care provider may also give you more specific instructions.   Your treatment has been planned according to current medical practices, but problems sometimes occur. Call your health care provider if you have any problems or questions after your procedure. °What can I expect after the procedure? °After your procedure, it is common: °· To feel sleepy for several hours. °· To feel clumsy and have poor balance for several hours. °· To have poor judgment for several hours. °· To vomit if you eat too soon. °Follow these instructions at home: °For at least 24 hours after the procedure: ° °· Do not: °? Participate in activities where you could fall or become injured. °? Drive. °? Use heavy machinery. °? Drink alcohol. °? Take sleeping pills or medicines that cause drowsiness. °? Make important decisions or sign legal documents. °? Take care of children on your own. °· Rest. °Eating and drinking °· Follow the diet recommended by your health care provider. °· If you vomit: °? Drink water, juice, or soup when you can drink without vomiting. °? Make sure you have little or no nausea before eating solid foods. °General instructions °· Have a responsible adult stay with you until you are awake and alert. °· Take over-the-counter and prescription medicines only as told by your health care provider. °· If you smoke, do not smoke without supervision. °· Keep all follow-up visits as told by your health care provider. This is important. °Contact a health care provider if: °· You keep feeling nauseous or you keep vomiting. °· You feel light-headed. °· You develop a rash. °· You have a fever. °Get help right away if: °· You have trouble breathing. °This information is not intended to replace advice given to you by your health care provider. Make sure you discuss any questions you have with your health care provider. °Document Released: 08/27/2013  Document Revised: 10/19/2017 Document Reviewed: 02/26/2016 °[employer] Patient Education © 2020 [employer] ° °

## 2019-07-29 ENCOUNTER — Encounter: Payer: Self-pay | Admitting: Hematology

## 2019-07-29 ENCOUNTER — Inpatient Hospital Stay: Payer: 59

## 2019-07-29 ENCOUNTER — Other Ambulatory Visit: Payer: Self-pay | Admitting: Hematology

## 2019-07-29 ENCOUNTER — Telehealth: Payer: Self-pay | Admitting: Hematology

## 2019-07-29 ENCOUNTER — Other Ambulatory Visit: Payer: Self-pay

## 2019-07-29 ENCOUNTER — Inpatient Hospital Stay: Payer: 59 | Attending: Hematology

## 2019-07-29 ENCOUNTER — Inpatient Hospital Stay (HOSPITAL_BASED_OUTPATIENT_CLINIC_OR_DEPARTMENT_OTHER): Payer: 59 | Admitting: Hematology

## 2019-07-29 VITALS — BP 114/62 | HR 67 | Temp 98.0°F | Resp 17

## 2019-07-29 VITALS — BP 114/58 | HR 65 | Temp 97.3°F | Resp 18 | Ht 69.0 in | Wt 169.8 lb

## 2019-07-29 DIAGNOSIS — C9000 Multiple myeloma not having achieved remission: Secondary | ICD-10-CM

## 2019-07-29 DIAGNOSIS — T451X5A Adverse effect of antineoplastic and immunosuppressive drugs, initial encounter: Secondary | ICD-10-CM | POA: Diagnosis not present

## 2019-07-29 DIAGNOSIS — D6481 Anemia due to antineoplastic chemotherapy: Secondary | ICD-10-CM | POA: Diagnosis not present

## 2019-07-29 DIAGNOSIS — D701 Agranulocytosis secondary to cancer chemotherapy: Secondary | ICD-10-CM

## 2019-07-29 DIAGNOSIS — Z5112 Encounter for antineoplastic immunotherapy: Secondary | ICD-10-CM | POA: Diagnosis present

## 2019-07-29 DIAGNOSIS — Z7189 Other specified counseling: Secondary | ICD-10-CM

## 2019-07-29 LAB — CMP (CANCER CENTER ONLY)
ALT: 21 U/L (ref 0–44)
AST: 27 U/L (ref 15–41)
Albumin: 3.8 g/dL (ref 3.5–5.0)
Alkaline Phosphatase: 70 U/L (ref 38–126)
Anion gap: 6 (ref 5–15)
BUN: 23 mg/dL (ref 8–23)
CO2: 28 mmol/L (ref 22–32)
Calcium: 9.1 mg/dL (ref 8.9–10.3)
Chloride: 103 mmol/L (ref 98–111)
Creatinine: 0.85 mg/dL (ref 0.61–1.24)
GFR, Est AFR Am: >60 mL/min (ref >60)
GFR, Estimated: >60 mL/min (ref >60)
Glucose, Bld: 108 mg/dL — ABNORMAL HIGH (ref 70–99)
Potassium: 4.2 mmol/L (ref 3.5–5.1)
Sodium: 137 mmol/L (ref 135–145)
Total Bilirubin: 0.5 mg/dL (ref 0.3–1.2)
Total Protein: 7.2 g/dL (ref 6.5–8.1)

## 2019-07-29 LAB — CBC WITH DIFFERENTIAL (CANCER CENTER ONLY)
Abs Immature Granulocytes: 0 10*3/uL (ref 0.00–0.07)
Basophils Absolute: 0 10*3/uL (ref 0.0–0.1)
Basophils Relative: 1 %
Eosinophils Absolute: 0 10*3/uL (ref 0.0–0.5)
Eosinophils Relative: 2 %
HCT: 35.5 % — ABNORMAL LOW (ref 39.0–52.0)
Hemoglobin: 12.4 g/dL — ABNORMAL LOW (ref 13.0–17.0)
Immature Granulocytes: 0 %
Lymphocytes Relative: 27 %
Lymphs Abs: 0.5 10*3/uL — ABNORMAL LOW (ref 0.7–4.0)
MCH: 33.9 pg (ref 26.0–34.0)
MCHC: 34.9 g/dL (ref 30.0–36.0)
MCV: 97 fL (ref 80.0–100.0)
Monocytes Absolute: 0.3 10*3/uL (ref 0.1–1.0)
Monocytes Relative: 18 %
Neutro Abs: 1 10*3/uL — ABNORMAL LOW (ref 1.7–7.7)
Neutrophils Relative %: 52 %
Platelet Count: 166 10*3/uL (ref 150–400)
RBC: 3.66 MIL/uL — ABNORMAL LOW (ref 4.22–5.81)
RDW: 14.5 % (ref 11.5–15.5)
WBC Count: 1.9 10*3/uL — ABNORMAL LOW (ref 4.0–10.5)
nRBC: 0 % (ref 0.0–0.2)

## 2019-07-29 LAB — TYPE AND SCREEN
ABO/RH(D): O NEG
Antibody Screen: NEGATIVE

## 2019-07-29 LAB — ABO/RH: ABO/RH(D): O NEG

## 2019-07-29 LAB — PRETREATMENT RBC PHENOTYPE

## 2019-07-29 MED ORDER — SODIUM CHLORIDE 0.9 % IV SOLN
7.8000 mg/kg | Freq: Once | INTRAVENOUS | Status: AC
  Administered 2019-07-29: 600 mg via INTRAVENOUS
  Filled 2019-07-29: qty 20

## 2019-07-29 MED ORDER — ACETAMINOPHEN 325 MG PO TABS
ORAL_TABLET | ORAL | Status: AC
  Filled 2019-07-29: qty 2

## 2019-07-29 MED ORDER — SODIUM CHLORIDE 0.9 % IV SOLN
Freq: Once | INTRAVENOUS | Status: AC
  Administered 2019-07-29: 09:00:00 via INTRAVENOUS
  Filled 2019-07-29: qty 250

## 2019-07-29 MED ORDER — SODIUM CHLORIDE 0.9% FLUSH
10.0000 mL | INTRAVENOUS | Status: DC | PRN
  Administered 2019-07-29: 10 mL
  Filled 2019-07-29: qty 10

## 2019-07-29 MED ORDER — ACETAMINOPHEN 325 MG PO TABS
650.0000 mg | ORAL_TABLET | Freq: Once | ORAL | Status: AC
  Administered 2019-07-29: 650 mg via ORAL

## 2019-07-29 MED ORDER — MONTELUKAST SODIUM 10 MG PO TABS
10.0000 mg | ORAL_TABLET | Freq: Once | ORAL | Status: AC
  Administered 2019-07-29: 10 mg via ORAL
  Filled 2019-07-29: qty 1

## 2019-07-29 MED ORDER — SODIUM CHLORIDE 0.9 % IV SOLN
20.0000 mg | Freq: Once | INTRAVENOUS | Status: AC
  Administered 2019-07-29: 20 mg via INTRAVENOUS
  Filled 2019-07-29: qty 20

## 2019-07-29 MED ORDER — FAMOTIDINE IN NACL 20-0.9 MG/50ML-% IV SOLN
INTRAVENOUS | Status: AC
  Filled 2019-07-29: qty 50

## 2019-07-29 MED ORDER — DIPHENHYDRAMINE HCL 25 MG PO CAPS
ORAL_CAPSULE | ORAL | Status: AC
  Filled 2019-07-29: qty 1

## 2019-07-29 MED ORDER — FAMOTIDINE IN NACL 20-0.9 MG/50ML-% IV SOLN
20.0000 mg | Freq: Once | INTRAVENOUS | Status: AC
  Administered 2019-07-29: 20 mg via INTRAVENOUS

## 2019-07-29 MED ORDER — DIPHENHYDRAMINE HCL 25 MG PO TABS
25.0000 mg | ORAL_TABLET | Freq: Once | ORAL | Status: DC
  Filled 2019-07-29: qty 1

## 2019-07-29 MED ORDER — DIPHENHYDRAMINE HCL 25 MG PO CAPS
ORAL_CAPSULE | ORAL | Status: AC
  Filled 2019-07-29: qty 2

## 2019-07-29 MED ORDER — HEPARIN SOD (PORK) LOCK FLUSH 100 UNIT/ML IV SOLN
500.0000 [IU] | Freq: Once | INTRAVENOUS | Status: AC | PRN
  Administered 2019-07-29: 16:00:00 500 [IU]
  Filled 2019-07-29: qty 5

## 2019-07-29 MED ORDER — DIPHENHYDRAMINE HCL 25 MG PO CAPS
50.0000 mg | ORAL_CAPSULE | Freq: Once | ORAL | Status: AC
  Administered 2019-07-29: 50 mg via ORAL

## 2019-07-29 NOTE — Telephone Encounter (Signed)
No change in appts per 9/8 los

## 2019-07-29 NOTE — Progress Notes (Signed)
[facility] OFFICE PROGRESS NOTE  Patient Care Team: Patient, No Pcp Per as PCP - General (General Practice) Cordelia Poche, RN as Oncology Nurse Navigator [name], MD as Medical Oncologist (Hematology)  HEME/ONC OVERVIEW: 1. 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 and DS  -10/2018: MRI of the right shoulder for arm pain showed innumerable enhancing lesions throughout the right humerus, ribs and right scapula; no fractures -11/2018: baseline labs  Hgb 10, Ca 12.2, Cr 2.89, albumin 2.9  M-spike 3.7 g/dL, free lambda 7.8, monoclonal IgG lambda on IFE, quant IgG 5602, LDH 180, B2M 6.8  PET showed innumerable lytic lesions involving the axial and proximal appendicular skeleton and calvarium; no plasmacytoma   BM bx: normocellular marrow with plasma cell neoplasm (~64% of all cells); myeloma FISH showed trisomy 11 and gain of ATM gene (standard risk) -12/2018 - 04/2019: 1st line q21d RVd, PR   Pre-BMT bone marrow eval showed hypocellular marrow with persistent 10-15% lambda-restricted plasma cells  -05/2019 - 06/2019: 2nd line CyBorD, PD   Disease progression with rising M-spike from 1.1 to 1.4g/dL and quant IgG from 1844 to 2152  -07/2019 - present: DPd   2. 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  -S/p Xgeva x 1 for Ca 12.2; Zometa held due to multiple dental procedures -02/2019: pathologic left humerus fracture -03/2019 - present: q75monthZometa   TREATMENT REGIMEN:  01/09/2019 - 05/08/2019: 1st line q21day RVd x 6 cycles, PR  04/03/2019 - present: q322monthometa; cleared by dentistry  04/08/2019: IMNP for left humerus fracture   05/29/2019 - 07/17/2019: 2nd line q28day CyBorD x 2 cycles, PD   07/29/2019 - present: 3rd line DPd  ASSESSMENT & PLAN:   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 and DS  -S/p 6 cycles of RVd and 2 cycles of CyBorD  -Unfortunately, MM panel after 2 cycles of CyBorD showed rising M-spike and  quant IgG, consistent w/ disease progression -[facility] -I reinforced the rationale for chemotherapy, as well as some of the potential side effects -We will plan to reassess response after 2 cycles of DPd, and if improving, then we will proceed with auto-SCT -VZV prophylaxis: acyclovir -PRN anti-emetics; Zofran and Compazine   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  -S/p intramedullary nail placement for pathologic left humerus fracture on 04/08/2019 -q3m11monthmeta resumed in 03/2019 after dental clearance; next due in mid-09/2019 -On Ca-Vit D supplement BID   Chemotherapy-associated leukopenia -Secondary to chemotherapy -WBC 1.9k with ANC 1026, lower than the last visit  -Patient denies any symptoms of infection -We will proceed with treatment as scheduled -I counseled the patient on importance of monitoring any concerning symptoms, including fever (T > 100.4), for which he is instructed to contact the clinic ASAP  Chemotherapy-associated anemia -Secondary to chemotherapy -Hgb 12.4, stable  -Patient denies any symptom of bleeding -We will monitor for now  No orders of the defined types were placed in this encounter.   All questions were answered. The patient knows to call the clinic with any problems, questions or concerns. No barriers to learning was detected.  Return on C1D22 of DPd for labs, port flush, and clinic follow-up.  YanTish MenD 07/29/2019 9:09 AM  CHIEF COMPLAINT: "I am feeling fine"  INTERVAL HISTORY: Danny Juarez to clinic for follow-up of multiple myeloma.  Patient reports that he is doing well, and  has not had any new symptoms, such as bone pain or constitutional symptoms.  He took 1 tablet of dexamethasone 4 mg this morning instead of 5 tabs.  He otherwise feels well this morning, and denies any complaint.   SUMMARY OF ONCOLOGIC HISTORY: Oncology  History  Multiple myeloma ([name])  11/11/2018 Imaging   MRI right shoulder for arm pain showed innumerable enhancing lesions throughout the right humerus, ribs and right scapula; no fractures   11/29/2018 Miscellaneous   Baseline labs -Hgb 10, Ca 12.2, Cr 2.89, albumin 2.9 -M-spike 3.7 g/dL, free lambda 7.8, monoclonal IgG lambda on IFE, quant IgG 5602, LDH 180, B2M 6.8   01/09/2019 - 05/14/2019 Chemotherapy   The patient had bortezomib SQ (VELCADE) chemo injection 2.5 mg, 1.3 mg/m2 = 2.5 mg, Subcutaneous,  Once, 6 of 10 cycles Administration: 2.5 mg (01/09/2019), 2.5 mg (01/16/2019), 2.5 mg (01/23/2019), 2.5 mg (01/30/2019), 2.5 mg (02/06/2019), 2.5 mg (02/13/2019), 2.5 mg (02/20/2019), 2.5 mg (02/27/2019), 2.5 mg (03/06/2019), 2.5 mg (03/13/2019), 2.5 mg (03/20/2019), 2.5 mg (03/27/2019), 2.5 mg (04/03/2019), 2.5 mg (04/10/2019), 2.5 mg (04/17/2019), 2.5 mg (04/24/2019), 2.5 mg (05/01/2019), 2.5 mg (05/08/2019)  for chemotherapy treatment.    05/29/2019 - 07/23/2019 Chemotherapy   The patient had bortezomib SQ (VELCADE) chemo injection 2.75 mg, 1.5 mg/m2 = 2.75 mg, Subcutaneous,  Once, 2 of 4 cycles Administration: 2.75 mg (05/29/2019), 2.75 mg (06/05/2019), 2.75 mg (06/12/2019), 2.75 mg (06/19/2019), 2.75 mg (06/26/2019), 2.75 mg (07/03/2019), 2.75 mg (07/10/2019), 2.75 mg (07/17/2019)  for chemotherapy treatment.    07/29/2019 -  Chemotherapy   The patient had daratumumab (DARZALEX) 620 mg in sodium chloride 0.9 % 469 mL (1.24 mg/mL) chemo infusion, 8 mg/kg = 620 mg, Intravenous, Once, 1 of 7 cycles  for chemotherapy treatment.      REVIEW OF SYSTEMS:   Constitutional: ( - ) fevers, ( - )  chills , ( - ) night sweats Eyes: ( - ) blurriness of vision, ( - ) double vision, ( - ) watery eyes Ears, nose, mouth, throat, and face: ( - ) mucositis, ( - ) sore throat Respiratory: ( - ) cough, ( - ) dyspnea, ( - ) wheezes Cardiovascular: ( - ) palpitation, ( - ) chest discomfort, ( - ) lower extremity swelling Gastrointestinal:  ( -  ) nausea, ( - ) heartburn, ( - ) change in bowel habits Skin: ( - ) abnormal skin rashes Lymphatics: ( - ) new lymphadenopathy, ( - ) easy bruising Neurological: ( - ) numbness, ( - ) tingling, ( - ) new weaknesses Behavioral/Psych: ( - ) mood change, ( - ) new changes  All other systems were reviewed with the patient and are negative.  I have reviewed the past medical history, past surgical history, social history and family history with the patient and they are unchanged from previous note.  ALLERGIES:  has No Known Allergies.  MEDICATIONS:  Current Outpatient Medications  Medication Sig Dispense Refill  . acyclovir (ZOVIRAX) 400 MG tablet Take 1 tablet (400 mg total) by mouth 2 (two) times daily. (Patient taking differently: Take 400 mg by mouth 2 (two) times daily as needed (fever blisters). ) 60 tablet 3  . aspirin EC 81 MG tablet Take 81 mg by mouth daily.    Marland Kitchen dexamethasone (DECADRON) 4 MG tablet Take 5 tablets (15m) on days 1, 8, 15 and 22 of each cycle Take 1 tab (488m for 2 days after each dose of daratumumab 30 tablet 11  . lidocaine-prilocaine (EMLA)  cream Apply to affected area once 30 g 3  . montelukast (SINGULAIR) 10 MG tablet Take 1 tablet (10 mg total) by mouth at bedtime. 30 tablet 6  . ondansetron (ZOFRAN ODT) 4 MG disintegrating tablet Take 1 tablet (4 mg total) by mouth every 8 (eight) hours as needed. 20 tablet 0  . pomalidomide (POMALYST) 4 MG capsule Take 1 capsule (4 mg total) by mouth daily. Take with water on days 1-21. Repeat every 28 days. 21 capsule 0  . prochlorperazine (COMPAZINE) 10 MG tablet Take 1 tablet (10 mg total) by mouth every 6 (six) hours as needed (Nausea or vomiting). 30 tablet 1  . CALCIUM-VITAMIN D PO Take 1 tablet by mouth 2 (two) times a day.    Marland Kitchen LORazepam (ATIVAN) 0.5 MG tablet Take 1 tablet (0.5 mg total) by mouth every 6 (six) hours as needed (Nausea or vomiting). (Patient not taking: Reported on 07/10/2019) 30 tablet 0  . Multiple Vitamin  (MULTIVITAMIN WITH MINERALS) TABS Take 1 tablet by mouth daily.    . ondansetron (ZOFRAN) 8 MG tablet Take 1 tablet (8 mg total) by mouth 2 (two) times daily as needed (Nausea or vomiting). (Patient not taking: Reported on 07/10/2019) 30 tablet 1   No current facility-administered medications for this visit.    Facility-Administered Medications Ordered in Other Visits  Medication Dose Route Frequency Provider Last Rate Last Dose  . 0.9 %  sodium chloride infusion   Intravenous Once [name], MD      . acetaminophen (TYLENOL) tablet 650 mg  650 mg Oral Once [name], MD      . daratumumab [facility]) 620 mg in sodium chloride 0.9 % 469 mL (1.24 mg/mL) chemo infusion  8 mg/kg (Treatment Plan Recorded) Intravenous Once [name], MD      . dexamethasone (DECADRON) 20 mg in sodium chloride 0.9 % 50 mL IVPB  20 mg Intravenous Once [name], MD      . diphenhydrAMINE (BENADRYL) capsule 50 mg  50 mg Oral Once [name], MD      . heparin lock flush 100 unit/mL  500 Units Intracatheter Once PRN [name], MD      . montelukast (SINGULAIR) tablet 10 mg  10 mg Oral Once [name], MD      . sodium chloride flush (NS) 0.9 % injection 10 mL  10 mL Intracatheter PRN [name], MD        PHYSICAL EXAMINATION: ECOG PERFORMANCE STATUS: 1 - Symptomatic but completely ambulatory  Today's Vitals   07/29/19 0857  BP: (!) 114/58  Pulse: 65  Resp: 18  Temp: (!) 97.3 F (36.3 C)  TempSrc: Temporal  SpO2: 97%  Weight: 169 lb 12.8 oz (77 kg)  Height: '5\' 9"'  (1.753 m)  PainSc: 0-No pain   Body mass index is 25.08 kg/m.  Filed Weights   07/29/19 0857  Weight: 169 lb 12.8 oz (77 kg)    GENERAL: alert, no distress and comfortable SKIN: skin color, texture, turgor are normal, no rashes or significant lesions EYES: conjunctiva are pink and non-injected, sclera clear NECK: supple, non-tender LYMPH:  no palpable lymphadenopathy in the cervical LUNGS: clear to auscultation with normal breathing  effort HEART: regular rate & rhythm and no murmurs and no lower extremity edema ABDOMEN: soft, non-tender, non-distended, normal bowel sounds Musculoskeletal: no cyanosis of digits and no clubbing  PSYCH: alert & oriented x 3, fluent speech NEURO: no focal motor/sensory deficits  LABORATORY DATA:  I have reviewed the  data as listed    Component Value Date/Time   NA 137 07/29/2019 0813   K 4.2 07/29/2019 0813   CL 103 07/29/2019 0813   CO2 28 07/29/2019 0813   GLUCOSE 108 (H) 07/29/2019 0813   BUN 23 07/29/2019 0813   CREATININE 0.85 07/29/2019 0813   CALCIUM 9.1 07/29/2019 0813   PROT 7.2 07/29/2019 0813   ALBUMIN 3.8 07/29/2019 0813   AST 27 07/29/2019 0813   ALT 21 07/29/2019 0813   ALKPHOS 70 07/29/2019 0813   BILITOT 0.5 07/29/2019 0813   GFRNONAA >60 07/29/2019 0813   GFRAA >60 07/29/2019 0813    No results found for: SPEP, UPEP  Lab Results  Component Value Date   WBC 1.9 (L) 07/29/2019   NEUTROABS 1.0 (L) 07/29/2019   HGB 12.4 (L) 07/29/2019   HCT 35.5 (L) 07/29/2019   MCV 97.0 07/29/2019   PLT 166 07/29/2019      Chemistry      Component Value Date/Time   NA 137 07/29/2019 0813   K 4.2 07/29/2019 0813   CL 103 07/29/2019 0813   CO2 28 07/29/2019 0813   BUN 23 07/29/2019 0813   CREATININE 0.85 07/29/2019 0813      Component Value Date/Time   CALCIUM 9.1 07/29/2019 0813   ALKPHOS 70 07/29/2019 0813   AST 27 07/29/2019 0813   ALT 21 07/29/2019 0813   BILITOT 0.5 07/29/2019 0813       RADIOGRAPHIC STUDIES: I have personally reviewed the radiological images as listed below and agreed with the findings in the report. Ir Imaging Guided Port Insertion  Result Date: 07/25/2019 INDICATION: 63 year old with multiple myeloma. Port-A-Cath needed for treatment. EXAM: FLUOROSCOPIC AND ULTRASOUND GUIDED PLACEMENT OF A SUBCUTANEOUS PORT COMPARISON:  None. MEDICATIONS: Ancef 2 g; The antibiotic was administered within an appropriate time interval prior to skin  puncture. ANESTHESIA/SEDATION: Versed 2.0 mg IV; Fentanyl 100 mcg IV; Moderate Sedation Time:  29 minutes The patient was continuously monitored during the procedure by the interventional radiology nurse under my direct supervision. FLUOROSCOPY TIME:  24 seconds, 2 mGy COMPLICATIONS: None immediate. PROCEDURE: The procedure, risks, benefits, and alternatives were explained to the patient. Questions regarding the procedure were encouraged and answered. The patient understands and consents to the procedure. Patient was placed supine on the interventional table. Ultrasound confirmed a patent right internal jugular vein. Ultrasound image was saved for documentation. The right chest and neck were cleaned with a skin antiseptic and a sterile drape was placed. Maximal barrier sterile technique was utilized including caps, mask, sterile gowns, sterile gloves, sterile drape, hand hygiene and skin antiseptic. The right neck was anesthetized with 1% lidocaine. Small incision was made in the right neck with a blade. Micropuncture set was placed in the right internal jugular vein with ultrasound guidance. The micropuncture wire was used for measurement purposes. The right chest was anesthetized with 1% lidocaine with epinephrine. #15 blade was used to make an incision and a subcutaneous port pocket was formed. [place] was assembled. Subcutaneous tunnel was formed with a stiff tunneling device. The port catheter was brought through the subcutaneous tunnel. The port was placed in the subcutaneous pocket. The micropuncture set was exchanged for a peel-away sheath. The catheter was placed through the peel-away sheath and the tip was positioned at the SVC and right atrium junction. Catheter placement was confirmed with fluoroscopy. The port was accessed and flushed with heparinized saline. The port pocket was closed using two layers of absorbable sutures  and Dermabond. The vein skin site was closed using a single layer of  absorbable suture and Dermabond. Sterile dressings were applied. Patient tolerated the procedure well without an immediate complication. Ultrasound and fluoroscopic images were taken and saved for this procedure. IMPRESSION: Placement of a subcutaneous port device. Catheter tip at the SVC and right atrium junction. Electronically Signed   By: Markus Daft M.D.   On: 07/25/2019 16:54

## 2019-07-29 NOTE — Patient Instructions (Signed)
Daratumumab injection What is this medicine? DARATUMUMAB (dar a toom ue mab) is a monoclonal antibody. It is used to treat multiple myeloma. This medicine may be used for other purposes; ask your health care provider or pharmacist if you have questions. COMMON BRAND NAME(S): DARZALEX What should I tell my health care provider before I take this medicine? They need to know if you have any of these conditions:  infection (especially a virus infection such as chickenpox, herpes, or hepatitis B virus)  lung or breathing disease  an unusual or allergic reaction to daratumumab, other medicines, foods, dyes, or preservatives  pregnant or trying to get pregnant  breast-feeding How should I use this medicine? This medicine is for infusion into a vein. It is given by a health care professional in a hospital or clinic setting. Talk to your pediatrician regarding the use of this medicine in children. Special care may be needed. Overdosage: If you think you have taken too much of this medicine contact a poison control center or emergency room at once. NOTE: This medicine is only for you. Do not share this medicine with others. What if I miss a dose? Keep appointments for follow-up doses as directed. It is important not to miss your dose. Call your doctor or health care professional if you are unable to keep an appointment. What may interact with this medicine? Interactions have not been studied. This list may not describe all possible interactions. Give your health care provider a list of all the medicines, herbs, non-prescription drugs, or dietary supplements you use. Also tell them if you smoke, drink alcohol, or use illegal drugs. Some items may interact with your medicine. What should I watch for while using this medicine? This drug may make you feel generally unwell. Report any side effects. Continue your course of treatment even though you feel ill unless your doctor tells you to stop. This  medicine can cause serious allergic reactions. To reduce your risk you may need to take medicine before treatment with this medicine. Take your medicine as directed. This medicine can affect the results of blood tests to match your blood type. These changes can last for up to 6 months after the final dose. Your healthcare provider will do blood tests to match your blood type before you start treatment. Tell all of your healthcare providers that you are being treated with this medicine before receiving a blood transfusion. This medicine can affect the results of some tests used to determine treatment response; extra tests may be needed to evaluate response. Do not become pregnant while taking this medicine or for 3 months after stopping it. Women should inform their doctor if they wish to become pregnant or think they might be pregnant. There is a potential for serious side effects to an unborn child. Talk to your health care professional or pharmacist for more information. What side effects may I notice from receiving this medicine? Side effects that you should report to your doctor or health care professional as soon as possible:  allergic reactions like skin rash, itching or hives, swelling of the face, lips, or tongue  breathing problems  chills  cough  dizziness  feeling faint or lightheaded  headache  low blood counts - this medicine may decrease the number of white blood cells, red blood cells and platelets. You may be at increased risk for infections and bleeding.  nausea, vomiting  shortness of breath  signs of decreased platelets or bleeding - bruising, pinpoint red spots on  the skin, black, tarry stools, blood in the urine  signs of decreased red blood cells - unusually weak or tired, feeling faint or lightheaded, falls  signs of infection - fever or chills, cough, sore throat, pain or difficulty passing urine  signs and symptoms of liver injury like dark yellow or brown  urine; general ill feeling or flu-like symptoms; light-colored stools; loss of appetite; right upper belly pain; unusually weak or tired; yellowing of the eyes or skin Side effects that usually do not require medical attention (report to your doctor or health care professional if they continue or are bothersome):  back pain  constipation  loss of appetite  diarrhea  joint pain  muscle cramps  pain, tingling, numbness in the hands or feet  swelling of the ankles, feet, hands  tiredness  trouble sleeping This list may not describe all possible side effects. Call your doctor for medical advice about side effects. You may report side effects to FDA at 1-800-FDA-1088. Where should I keep my medicine? Keep out of the reach of children. This drug is given in a hospital or clinic and will not be stored at home. NOTE: This sheet is a summary. It may not cover all possible information. If you have questions about this medicine, talk to your doctor, pharmacist, or health care provider.  2020 Elsevier/Gold Standard (2018-08-22 14:00:48)

## 2019-07-30 ENCOUNTER — Other Ambulatory Visit: Payer: Self-pay | Admitting: Hematology

## 2019-07-30 ENCOUNTER — Inpatient Hospital Stay: Payer: 59

## 2019-07-30 VITALS — BP 119/68 | HR 58 | Temp 98.4°F | Resp 18

## 2019-07-30 DIAGNOSIS — Z7189 Other specified counseling: Secondary | ICD-10-CM

## 2019-07-30 DIAGNOSIS — C9000 Multiple myeloma not having achieved remission: Secondary | ICD-10-CM

## 2019-07-30 DIAGNOSIS — Z5112 Encounter for antineoplastic immunotherapy: Secondary | ICD-10-CM | POA: Diagnosis not present

## 2019-07-30 MED ORDER — DIPHENHYDRAMINE HCL 25 MG PO CAPS
50.0000 mg | ORAL_CAPSULE | Freq: Once | ORAL | Status: AC
  Administered 2019-07-30: 10:00:00 50 mg via ORAL

## 2019-07-30 MED ORDER — ACETAMINOPHEN 325 MG PO TABS
650.0000 mg | ORAL_TABLET | Freq: Once | ORAL | Status: AC
  Administered 2019-07-30: 650 mg via ORAL

## 2019-07-30 MED ORDER — SODIUM CHLORIDE 0.9 % IV SOLN
Freq: Once | INTRAVENOUS | Status: AC
  Administered 2019-07-30: 09:00:00 via INTRAVENOUS
  Filled 2019-07-30: qty 250

## 2019-07-30 MED ORDER — SODIUM CHLORIDE 0.9 % IV SOLN
7.8000 mg/kg | Freq: Once | INTRAVENOUS | Status: AC
  Administered 2019-07-30: 11:00:00 600 mg via INTRAVENOUS
  Filled 2019-07-30: qty 20

## 2019-07-30 MED ORDER — SODIUM CHLORIDE 0.9% FLUSH
10.0000 mL | INTRAVENOUS | Status: DC | PRN
  Administered 2019-07-30: 10 mL
  Filled 2019-07-30: qty 10

## 2019-07-30 MED ORDER — ACETAMINOPHEN 325 MG PO TABS
ORAL_TABLET | ORAL | Status: AC
  Filled 2019-07-30: qty 2

## 2019-07-30 MED ORDER — HEPARIN SOD (PORK) LOCK FLUSH 100 UNIT/ML IV SOLN
500.0000 [IU] | Freq: Once | INTRAVENOUS | Status: AC | PRN
  Administered 2019-07-30: 500 [IU]
  Filled 2019-07-30: qty 5

## 2019-07-30 MED ORDER — MONTELUKAST SODIUM 10 MG PO TABS
10.0000 mg | ORAL_TABLET | Freq: Once | ORAL | Status: AC
  Administered 2019-07-30: 10 mg via ORAL
  Filled 2019-07-30: qty 1

## 2019-07-30 MED ORDER — SODIUM CHLORIDE 0.9 % IV SOLN
20.0000 mg | Freq: Once | INTRAVENOUS | Status: AC
  Administered 2019-07-30: 10:00:00 20 mg via INTRAVENOUS
  Filled 2019-07-30: qty 2

## 2019-07-30 MED ORDER — DIPHENHYDRAMINE HCL 25 MG PO CAPS
ORAL_CAPSULE | ORAL | Status: AC
  Filled 2019-07-30: qty 2

## 2019-07-30 NOTE — Patient Instructions (Signed)
[facility] Discharge Instructions for Patients Receiving Chemotherapy  Today you received the following chemotherapy agents Darzalex  To help prevent nausea and vomiting after your treatment, we encourage you to take your nausea medication as prescribed by MD.   If you develop nausea and vomiting that is not controlled by your nausea medication, call the clinic.   BELOW ARE SYMPTOMS THAT SHOULD BE REPORTED IMMEDIATELY:  *FEVER GREATER THAN 100.5 F  *CHILLS WITH OR WITHOUT FEVER  NAUSEA AND VOMITING THAT IS NOT CONTROLLED WITH YOUR NAUSEA MEDICATION  *UNUSUAL SHORTNESS OF BREATH  *UNUSUAL BRUISING OR BLEEDING  TENDERNESS IN MOUTH AND THROAT WITH OR WITHOUT PRESENCE OF ULCERS  *URINARY PROBLEMS  *BOWEL PROBLEMS  UNUSUAL RASH Items with * indicate a potential emergency and should be followed up as soon as possible.  Feel free to call the clinic should you have any questions or concerns. The clinic phone number is [phone].  Please show the [facility] at check-in to the Emergency Department and triage nurse.

## 2019-07-31 ENCOUNTER — Telehealth: Payer: Self-pay | Admitting: Hematology

## 2019-07-31 NOTE — Telephone Encounter (Signed)
Called and LMVM for patient regarding provider change on 10/27 appt.  Dr Maylon Peppers will be out of office on PAL.

## 2019-08-05 ENCOUNTER — Ambulatory Visit: Payer: 59 | Admitting: Hematology

## 2019-08-05 ENCOUNTER — Other Ambulatory Visit: Payer: 59

## 2019-08-06 ENCOUNTER — Inpatient Hospital Stay: Payer: 59

## 2019-08-06 ENCOUNTER — Other Ambulatory Visit: Payer: Self-pay

## 2019-08-06 VITALS — BP 112/45 | HR 60 | Temp 97.5°F | Resp 18

## 2019-08-06 DIAGNOSIS — Z5112 Encounter for antineoplastic immunotherapy: Secondary | ICD-10-CM | POA: Diagnosis not present

## 2019-08-06 DIAGNOSIS — Z7189 Other specified counseling: Secondary | ICD-10-CM

## 2019-08-06 DIAGNOSIS — C9000 Multiple myeloma not having achieved remission: Secondary | ICD-10-CM

## 2019-08-06 LAB — CBC WITH DIFFERENTIAL (CANCER CENTER ONLY)
Abs Immature Granulocytes: 0.01 10*3/uL (ref 0.00–0.07)
Basophils Absolute: 0 10*3/uL (ref 0.0–0.1)
Basophils Relative: 0 %
Eosinophils Absolute: 0.1 10*3/uL (ref 0.0–0.5)
Eosinophils Relative: 5 %
HCT: 37.2 % — ABNORMAL LOW (ref 39.0–52.0)
Hemoglobin: 12.8 g/dL — ABNORMAL LOW (ref 13.0–17.0)
Immature Granulocytes: 0 %
Lymphocytes Relative: 10 %
Lymphs Abs: 0.2 10*3/uL — ABNORMAL LOW (ref 0.7–4.0)
MCH: 33.3 pg (ref 26.0–34.0)
MCHC: 34.4 g/dL (ref 30.0–36.0)
MCV: 96.9 fL (ref 80.0–100.0)
Monocytes Absolute: 0.4 10*3/uL (ref 0.1–1.0)
Monocytes Relative: 16 %
Neutro Abs: 1.7 10*3/uL (ref 1.7–7.7)
Neutrophils Relative %: 69 %
Platelet Count: 115 10*3/uL — ABNORMAL LOW (ref 150–400)
RBC: 3.84 MIL/uL — ABNORMAL LOW (ref 4.22–5.81)
RDW: 13.9 % (ref 11.5–15.5)
WBC Count: 2.4 10*3/uL — ABNORMAL LOW (ref 4.0–10.5)
nRBC: 0 % (ref 0.0–0.2)

## 2019-08-06 LAB — CMP (CANCER CENTER ONLY)
ALT: 16 U/L (ref 0–44)
AST: 14 U/L — ABNORMAL LOW (ref 15–41)
Albumin: 3.6 g/dL (ref 3.5–5.0)
Alkaline Phosphatase: 63 U/L (ref 38–126)
Anion gap: 7 (ref 5–15)
BUN: 26 mg/dL — ABNORMAL HIGH (ref 8–23)
CO2: 27 mmol/L (ref 22–32)
Calcium: 8.7 mg/dL — ABNORMAL LOW (ref 8.9–10.3)
Chloride: 103 mmol/L (ref 98–111)
Creatinine: 0.85 mg/dL (ref 0.61–1.24)
GFR, Est AFR Am: >60 mL/min (ref >60)
GFR, Estimated: >60 mL/min (ref >60)
Glucose, Bld: 112 mg/dL — ABNORMAL HIGH (ref 70–99)
Potassium: 4.1 mmol/L (ref 3.5–5.1)
Sodium: 137 mmol/L (ref 135–145)
Total Bilirubin: 0.6 mg/dL (ref 0.3–1.2)
Total Protein: 6.2 g/dL — ABNORMAL LOW (ref 6.5–8.1)

## 2019-08-06 MED ORDER — DIPHENHYDRAMINE HCL 25 MG PO CAPS
50.0000 mg | ORAL_CAPSULE | Freq: Once | ORAL | Status: AC
  Administered 2019-08-06: 50 mg via ORAL

## 2019-08-06 MED ORDER — SODIUM CHLORIDE 0.9% FLUSH
10.0000 mL | INTRAVENOUS | Status: DC | PRN
  Administered 2019-08-06: 10 mL
  Filled 2019-08-06: qty 10

## 2019-08-06 MED ORDER — ACETAMINOPHEN 325 MG PO TABS
650.0000 mg | ORAL_TABLET | Freq: Once | ORAL | Status: AC
  Administered 2019-08-06: 650 mg via ORAL

## 2019-08-06 MED ORDER — DIPHENHYDRAMINE HCL 25 MG PO CAPS
ORAL_CAPSULE | ORAL | Status: AC
  Filled 2019-08-06: qty 2

## 2019-08-06 MED ORDER — SODIUM CHLORIDE 0.9 % IV SOLN
Freq: Once | INTRAVENOUS | Status: AC
  Administered 2019-08-06: 09:00:00 via INTRAVENOUS
  Filled 2019-08-06: qty 250

## 2019-08-06 MED ORDER — ACETAMINOPHEN 325 MG PO TABS
ORAL_TABLET | ORAL | Status: AC
  Filled 2019-08-06: qty 2

## 2019-08-06 MED ORDER — SODIUM CHLORIDE 0.9 % IV SOLN
20.0000 mg | Freq: Once | INTRAVENOUS | Status: AC
  Administered 2019-08-06: 20 mg via INTRAVENOUS
  Filled 2019-08-06: qty 20

## 2019-08-06 MED ORDER — HEPARIN SOD (PORK) LOCK FLUSH 100 UNIT/ML IV SOLN
500.0000 [IU] | Freq: Once | INTRAVENOUS | Status: AC | PRN
  Administered 2019-08-06: 15:00:00 500 [IU]
  Filled 2019-08-06: qty 5

## 2019-08-06 MED ORDER — SODIUM CHLORIDE 0.9 % IV SOLN
15.7000 mg/kg | Freq: Once | INTRAVENOUS | Status: AC
  Administered 2019-08-06: 11:00:00 1200 mg via INTRAVENOUS
  Filled 2019-08-06: qty 60

## 2019-08-06 NOTE — Patient Instructions (Signed)
[facility] Discharge Instructions for Patients Receiving Chemotherapy  Today you received the following chemotherapy agents:  Darzalex  To help prevent nausea and vomiting after your treatment, we encourage you to take your nausea medication as prescribed.   If you develop nausea and vomiting that is not controlled by your nausea medication, call the clinic.   BELOW ARE SYMPTOMS THAT SHOULD BE REPORTED IMMEDIATELY:  *FEVER GREATER THAN 100.5 F  *CHILLS WITH OR WITHOUT FEVER  NAUSEA AND VOMITING THAT IS NOT CONTROLLED WITH YOUR NAUSEA MEDICATION  *UNUSUAL SHORTNESS OF BREATH  *UNUSUAL BRUISING OR BLEEDING  TENDERNESS IN MOUTH AND THROAT WITH OR WITHOUT PRESENCE OF ULCERS  *URINARY PROBLEMS  *BOWEL PROBLEMS  UNUSUAL RASH Items with * indicate a potential emergency and should be followed up as soon as possible.  Feel free to call the clinic should you have any questions or concerns. The clinic phone number is [phone].  Please show the CHEMO ALERT CARD at check-in to the Emergency Department and triage nurse.   

## 2019-08-06 NOTE — Patient Instructions (Signed)

## 2019-08-12 ENCOUNTER — Other Ambulatory Visit: Payer: Self-pay

## 2019-08-12 ENCOUNTER — Inpatient Hospital Stay: Payer: 59

## 2019-08-12 VITALS — BP 99/57 | HR 52 | Temp 97.5°F | Resp 16

## 2019-08-12 DIAGNOSIS — Z7189 Other specified counseling: Secondary | ICD-10-CM

## 2019-08-12 DIAGNOSIS — Z5112 Encounter for antineoplastic immunotherapy: Secondary | ICD-10-CM | POA: Diagnosis not present

## 2019-08-12 DIAGNOSIS — C9000 Multiple myeloma not having achieved remission: Secondary | ICD-10-CM

## 2019-08-12 LAB — CBC WITH DIFFERENTIAL (CANCER CENTER ONLY)
Abs Immature Granulocytes: 0.01 10*3/uL (ref 0.00–0.07)
Basophils Absolute: 0 10*3/uL (ref 0.0–0.1)
Basophils Relative: 0 %
Eosinophils Absolute: 0.1 10*3/uL (ref 0.0–0.5)
Eosinophils Relative: 6 %
HCT: 35.2 % — ABNORMAL LOW (ref 39.0–52.0)
Hemoglobin: 12.5 g/dL — ABNORMAL LOW (ref 13.0–17.0)
Immature Granulocytes: 0 %
Lymphocytes Relative: 11 %
Lymphs Abs: 0.3 10*3/uL — ABNORMAL LOW (ref 0.7–4.0)
MCH: 33.9 pg (ref 26.0–34.0)
MCHC: 35.5 g/dL (ref 30.0–36.0)
MCV: 95.4 fL (ref 80.0–100.0)
Monocytes Absolute: 0.4 10*3/uL (ref 0.1–1.0)
Monocytes Relative: 17 %
Neutro Abs: 1.6 10*3/uL — ABNORMAL LOW (ref 1.7–7.7)
Neutrophils Relative %: 66 %
Platelet Count: 99 10*3/uL — ABNORMAL LOW (ref 150–400)
RBC: 3.69 MIL/uL — ABNORMAL LOW (ref 4.22–5.81)
RDW: 13.6 % (ref 11.5–15.5)
WBC Count: 2.5 10*3/uL — ABNORMAL LOW (ref 4.0–10.5)
nRBC: 0 % (ref 0.0–0.2)

## 2019-08-12 LAB — CMP (CANCER CENTER ONLY)
ALT: 18 U/L (ref 0–44)
AST: 16 U/L (ref 15–41)
Albumin: 3.4 g/dL — ABNORMAL LOW (ref 3.5–5.0)
Alkaline Phosphatase: 71 U/L (ref 38–126)
Anion gap: 8 (ref 5–15)
BUN: 17 mg/dL (ref 8–23)
CO2: 25 mmol/L (ref 22–32)
Calcium: 8.4 mg/dL — ABNORMAL LOW (ref 8.9–10.3)
Chloride: 104 mmol/L (ref 98–111)
Creatinine: 0.82 mg/dL (ref 0.61–1.24)
GFR, Est AFR Am: >60 mL/min (ref >60)
GFR, Estimated: >60 mL/min (ref >60)
Glucose, Bld: 106 mg/dL — ABNORMAL HIGH (ref 70–99)
Potassium: 3.9 mmol/L (ref 3.5–5.1)
Sodium: 137 mmol/L (ref 135–145)
Total Bilirubin: 0.7 mg/dL (ref 0.3–1.2)
Total Protein: 6 g/dL — ABNORMAL LOW (ref 6.5–8.1)

## 2019-08-12 MED ORDER — DIPHENHYDRAMINE HCL 25 MG PO CAPS
ORAL_CAPSULE | ORAL | Status: AC
  Filled 2019-08-12: qty 2

## 2019-08-12 MED ORDER — SODIUM CHLORIDE 0.9 % IV SOLN
Freq: Once | INTRAVENOUS | Status: AC
  Administered 2019-08-12: 09:00:00 via INTRAVENOUS
  Filled 2019-08-12: qty 250

## 2019-08-12 MED ORDER — ACETAMINOPHEN 325 MG PO TABS
ORAL_TABLET | ORAL | Status: AC
  Filled 2019-08-12: qty 2

## 2019-08-12 MED ORDER — DIPHENHYDRAMINE HCL 25 MG PO CAPS
50.0000 mg | ORAL_CAPSULE | Freq: Once | ORAL | Status: AC
  Administered 2019-08-12: 50 mg via ORAL

## 2019-08-12 MED ORDER — SODIUM CHLORIDE 0.9 % IV SOLN
15.7000 mg/kg | Freq: Once | INTRAVENOUS | Status: AC
  Administered 2019-08-12: 1200 mg via INTRAVENOUS
  Filled 2019-08-12: qty 60

## 2019-08-12 MED ORDER — SODIUM CHLORIDE 0.9% FLUSH
10.0000 mL | INTRAVENOUS | Status: DC | PRN
  Filled 2019-08-12: qty 10

## 2019-08-12 MED ORDER — HEPARIN SOD (PORK) LOCK FLUSH 100 UNIT/ML IV SOLN
500.0000 [IU] | Freq: Once | INTRAVENOUS | Status: AC | PRN
  Administered 2019-08-12: 500 [IU]
  Filled 2019-08-12: qty 5

## 2019-08-12 MED ORDER — SODIUM CHLORIDE 0.9 % IV SOLN
20.0000 mg | Freq: Once | INTRAVENOUS | Status: AC
  Administered 2019-08-12: 20 mg via INTRAVENOUS
  Filled 2019-08-12: qty 20

## 2019-08-12 MED ORDER — ACETAMINOPHEN 325 MG PO TABS
650.0000 mg | ORAL_TABLET | Freq: Once | ORAL | Status: AC
  Administered 2019-08-12: 650 mg via ORAL

## 2019-08-12 NOTE — Progress Notes (Signed)
Ok to treat with today's lab results per Dr. Maylon Peppers.

## 2019-08-12 NOTE — Progress Notes (Signed)
Patient had a mild rash late in the first infusion. He had not taken Singulair prior to his arrival at the clinic that morning. He has received the 2nd day of his split infusion and his 2nd full infusion without incident. The patient would prefer rapid infusion. Dr. Maylon Peppers has approved transition to rapid infusion today.

## 2019-08-12 NOTE — Patient Instructions (Signed)

## 2019-08-13 ENCOUNTER — Other Ambulatory Visit: Payer: Self-pay | Admitting: Hematology

## 2019-08-13 DIAGNOSIS — C9000 Multiple myeloma not having achieved remission: Secondary | ICD-10-CM

## 2019-08-14 ENCOUNTER — Telehealth: Payer: Self-pay | Admitting: Hematology & Oncology

## 2019-08-14 NOTE — Telephone Encounter (Signed)
Faxed medical records to: [facility] RN CASE [facility] F: [id number]   Danny Juarez Mar 22, 1956    COPY SCANNED

## 2019-08-18 ENCOUNTER — Other Ambulatory Visit: Payer: Self-pay | Admitting: *Deleted

## 2019-08-18 DIAGNOSIS — C9000 Multiple myeloma not having achieved remission: Secondary | ICD-10-CM

## 2019-08-18 MED ORDER — POMALIDOMIDE 4 MG PO CAPS: ORAL_CAPSULE | ORAL | 0 refills | Status: DC

## 2019-08-19 ENCOUNTER — Telehealth: Payer: Self-pay | Admitting: Hematology

## 2019-08-19 ENCOUNTER — Inpatient Hospital Stay: Payer: 59

## 2019-08-19 ENCOUNTER — Encounter: Payer: Self-pay | Admitting: Hematology

## 2019-08-19 ENCOUNTER — Inpatient Hospital Stay (HOSPITAL_BASED_OUTPATIENT_CLINIC_OR_DEPARTMENT_OTHER): Payer: 59 | Admitting: Hematology

## 2019-08-19 ENCOUNTER — Other Ambulatory Visit: Payer: Self-pay

## 2019-08-19 VITALS — BP 113/74 | HR 68 | Temp 97.3°F | Resp 17 | Ht 69.0 in | Wt 167.0 lb

## 2019-08-19 DIAGNOSIS — D6481 Anemia due to antineoplastic chemotherapy: Secondary | ICD-10-CM

## 2019-08-19 DIAGNOSIS — D701 Agranulocytosis secondary to cancer chemotherapy: Secondary | ICD-10-CM | POA: Diagnosis not present

## 2019-08-19 DIAGNOSIS — D6959 Other secondary thrombocytopenia: Secondary | ICD-10-CM | POA: Diagnosis not present

## 2019-08-19 DIAGNOSIS — T451X5A Adverse effect of antineoplastic and immunosuppressive drugs, initial encounter: Secondary | ICD-10-CM

## 2019-08-19 DIAGNOSIS — Z7189 Other specified counseling: Secondary | ICD-10-CM

## 2019-08-19 DIAGNOSIS — Z5112 Encounter for antineoplastic immunotherapy: Secondary | ICD-10-CM | POA: Diagnosis not present

## 2019-08-19 DIAGNOSIS — C9000 Multiple myeloma not having achieved remission: Secondary | ICD-10-CM

## 2019-08-19 DIAGNOSIS — H01002 Unspecified blepharitis right lower eyelid: Secondary | ICD-10-CM

## 2019-08-19 LAB — CMP (CANCER CENTER ONLY)
ALT: 17 U/L (ref 0–44)
AST: 14 U/L — ABNORMAL LOW (ref 15–41)
Albumin: 3.8 g/dL (ref 3.5–5.0)
Alkaline Phosphatase: 74 U/L (ref 38–126)
Anion gap: 8 (ref 5–15)
BUN: 28 mg/dL — ABNORMAL HIGH (ref 8–23)
CO2: 26 mmol/L (ref 22–32)
Calcium: 9.1 mg/dL (ref 8.9–10.3)
Chloride: 104 mmol/L (ref 98–111)
Creatinine: 0.87 mg/dL (ref 0.61–1.24)
GFR, Est AFR Am: >60 mL/min (ref >60)
GFR, Estimated: >60 mL/min (ref >60)
Glucose, Bld: 107 mg/dL — ABNORMAL HIGH (ref 70–99)
Potassium: 4.3 mmol/L (ref 3.5–5.1)
Sodium: 138 mmol/L (ref 135–145)
Total Bilirubin: 0.7 mg/dL (ref 0.3–1.2)
Total Protein: 6.2 g/dL — ABNORMAL LOW (ref 6.5–8.1)

## 2019-08-19 LAB — CBC WITH DIFFERENTIAL (CANCER CENTER ONLY)
Abs Immature Granulocytes: 0.01 10*3/uL (ref 0.00–0.07)
Basophils Absolute: 0 10*3/uL (ref 0.0–0.1)
Basophils Relative: 1 %
Eosinophils Absolute: 0.1 10*3/uL (ref 0.0–0.5)
Eosinophils Relative: 6 %
HCT: 37.6 % — ABNORMAL LOW (ref 39.0–52.0)
Hemoglobin: 13.1 g/dL (ref 13.0–17.0)
Immature Granulocytes: 1 %
Lymphocytes Relative: 9 %
Lymphs Abs: 0.1 10*3/uL — ABNORMAL LOW (ref 0.7–4.0)
MCH: 33.3 pg (ref 26.0–34.0)
MCHC: 34.8 g/dL (ref 30.0–36.0)
MCV: 95.7 fL (ref 80.0–100.0)
Monocytes Absolute: 0.3 10*3/uL (ref 0.1–1.0)
Monocytes Relative: 28 %
Neutro Abs: 0.6 10*3/uL — ABNORMAL LOW (ref 1.7–7.7)
Neutrophils Relative %: 55 %
Platelet Count: 118 10*3/uL — ABNORMAL LOW (ref 150–400)
RBC: 3.93 MIL/uL — ABNORMAL LOW (ref 4.22–5.81)
RDW: 13.2 % (ref 11.5–15.5)
WBC Count: 1 10*3/uL — ABNORMAL LOW (ref 4.0–10.5)
nRBC: 0 % (ref 0.0–0.2)

## 2019-08-19 MED ORDER — SODIUM CHLORIDE 0.9 % IV SOLN
Freq: Once | INTRAVENOUS | Status: AC
  Administered 2019-08-19: 10:00:00 via INTRAVENOUS
  Filled 2019-08-19: qty 250

## 2019-08-19 MED ORDER — DIPHENHYDRAMINE HCL 25 MG PO CAPS
ORAL_CAPSULE | ORAL | Status: AC
  Filled 2019-08-19: qty 2

## 2019-08-19 MED ORDER — SODIUM CHLORIDE 0.9% FLUSH
10.0000 mL | Freq: Once | INTRAVENOUS | Status: AC | PRN
  Administered 2019-08-19: 10 mL
  Filled 2019-08-19: qty 10

## 2019-08-19 MED ORDER — ACETAMINOPHEN 325 MG PO TABS
650.0000 mg | ORAL_TABLET | Freq: Once | ORAL | Status: AC
  Administered 2019-08-19: 10:00:00 650 mg via ORAL

## 2019-08-19 MED ORDER — SODIUM CHLORIDE 0.9 % IV SOLN
15.7000 mg/kg | Freq: Once | INTRAVENOUS | Status: AC
  Administered 2019-08-19: 11:00:00 1200 mg via INTRAVENOUS
  Filled 2019-08-19: qty 60

## 2019-08-19 MED ORDER — AZITHROMYCIN 250 MG PO TABS: ORAL_TABLET | ORAL | 0 refills | Status: DC

## 2019-08-19 MED ORDER — DIPHENHYDRAMINE HCL 25 MG PO CAPS
50.0000 mg | ORAL_CAPSULE | Freq: Once | ORAL | Status: AC
  Administered 2019-08-19: 50 mg via ORAL

## 2019-08-19 MED ORDER — ACETAMINOPHEN 325 MG PO TABS
ORAL_TABLET | ORAL | Status: AC
  Filled 2019-08-19: qty 2

## 2019-08-19 NOTE — Progress Notes (Signed)
[place] OFFICE PROGRESS NOTE  Patient Care Team: Patient, No Pcp Per as PCP - General (General Practice) Cordelia Poche, RN as Oncology Nurse Navigator [name], MD as Medical Oncologist (Hematology)  HEME/ONC OVERVIEW: 1. 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 and DS  -10/2018: MRI of the right shoulder for arm pain showed innumerable enhancing lesions throughout the right humerus, ribs and right scapula; no fractures -11/2018: baseline labs  Hgb 10, Ca 12.2, Cr 2.89, albumin 2.9  M-spike 3.7 g/dL, free lambda 7.8, monoclonal IgG lambda on IFE, quant IgG 5602, LDH 180, B2M 6.8  PET showed innumerable lytic lesions involving the axial and proximal appendicular skeleton and calvarium; no plasmacytoma   BM bx: normocellular marrow with plasma cell neoplasm (~64% of all cells); myeloma FISH showed trisomy 11 and gain of ATM gene (standard risk) -12/2018 - 04/2019: 1st line q21d RVd, PR   Pre-BMT bone marrow eval showed hypocellular marrow with persistent 10-15% lambda-restricted plasma cells  -05/2019 - 06/2019: 2nd line CyBorD, PD   Disease progression with rising M-spike from 1.1 to 1.4g/dL and quant IgG from 1844 to 2152  -07/2019 - present: DPd   2. 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  -S/p Xgeva x 1 for Ca 12.2; Zometa held due to multiple dental procedures -02/2019: pathologic left humerus fracture -03/2019 - present: q22monthZometa   TREATMENT REGIMEN:  01/09/2019 - 05/08/2019: 1st line q21day RVd x 6 cycles, PR  04/03/2019 - present: q357monthometa; cleared by dentistry  04/08/2019: IMNP for left humerus fracture   05/29/2019 - 07/17/2019: 2nd line q28day CyBorD x 2 cycles, PD   07/29/2019 - present: 3rd line DPd; q28day cycle   ASSESSMENT & PLAN:   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 and DS  -S/p 6 cycles of RVd and 2 cycles of CyBorD  -Currently on 3rd line DPd with the goal to bridge to auto-SCT   -Labs adequate today, proceed with C1D22 of DPd  -I have ordered MM panel at the start of Cycle 2 of DPd to assess response  -If he responds well, we will coordinate with [facility] to proceed with auto-SCT after 2 cycles of treatment  -VZV prophylaxis: acyclovir -PRN anti-emetics; Zofran and Compazine   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  -S/p intramedullary nail placement for pathologic left humerus fracture on 04/08/2019 -q3m10monthmeta resumed in 03/2019 after dental clearance; next due in mid-09/2019 -On Ca-Vit D supplement BID   Chemotherapy-associated leukopenia -Secondary to chemotherapy -WBC 1.0k with ANC 600, severely low  -Patient denies any fever; see treatment of blepharitis below  -We will proceed with treatment as scheduled -I counseled the patient on importance of monitoring any concerning symptoms, including fever (T > 100.4), for which he is instructed to contact the clinic ASAP  Chemotherapy-associated anemia -Secondary to chemotherapy -Hgb 13.1, stable  -Patient denies any symptom of bleeding -We will monitor for now  Chemotherapy-associated thrombocytopenia -Secondary to chemotherapy -Plts 118k, stable -Patient denies any symptoms of bleeding or excess bruising, such as epistaxis, hematochezia, melena, or hematuria -We will monitor for now; no indication for dose adjustment -If thrombocytopenia worsens in the future, we will consider delaying chemotherapy or adjusting chemotherapy dose  Blepharitis -Localized to the right lower eye lid; no purulent discharge -I discussed with the patient about supportive care, including warm compresses -Given the chemotherapy-induced neutropenia, I have prescribed azithromycin x 5 days  -Patient is instructed to call the  clinic if the eye lesion worsens   Orders Placed This Encounter  Procedures  . Multiple Myeloma Panel (SPEP&IFE w/QIG)    Standing Status:   Future    Standing Expiration Date:    09/22/2020  . Kappa/lambda light chains    Standing Status:   Future    Standing Expiration Date:   02/15/2021   All questions were answered. The patient knows to call the clinic with any problems, questions or concerns. No barriers to learning was detected.  Return in 4 weeks on C2D22 of DPd for labs, treatment and toxicity checks.   [name], MD 08/19/2019 9:33 AM  CHIEF COMPLAINT: "I am doing fine"  INTERVAL HISTORY: Danny Juarez returns to clinic for follow-up multiple myeloma on DPd.  He has been tolerating treatment well, he noticed a small right "stye" under the right the past few days, nontender, nondraining, and not affecting his vision.  He has not taken any antibiotic or applied any warm compress.  He denies any nausea, vomiting, diarrhea, or rash from the treatment.  He also reports that he has had a history of chronic arthritis in the lower extremities, particularly on the right side, for which she has had arthrocentesis of the right knee due to fluid accumulation in the past.  Over the past few weeks, he feels that his right hip is more sore with exertion, but he has not yet had to take any medication for pain, such as Tylenol.  He is planning to reach out to his orthopedic surgeon to discuss further management.  He denies any other complaint.  REVIEW OF SYSTEMS:   Constitutional: ( - ) fevers, ( - )  chills , ( - ) night sweats Eyes: ( - ) blurriness of vision, ( - ) double vision, ( - ) watery eyes Ears, nose, mouth, throat, and face: ( - ) mucositis, ( - ) sore throat Respiratory: ( - ) cough, ( - ) dyspnea, ( - ) wheezes Cardiovascular: ( - ) palpitation, ( - ) chest discomfort, ( - ) lower extremity swelling Gastrointestinal:  ( - ) nausea, ( - ) heartburn, ( - ) change in bowel habits Skin: ( + ) abnormal skin rashes Lymphatics: ( - ) new lymphadenopathy, ( - ) easy bruising Neurological: ( - ) numbness, ( - ) tingling, ( - ) new weaknesses Behavioral/Psych: ( - ) mood  change, ( - ) new changes  All other systems were reviewed with the patient and are negative.  SUMMARY OF ONCOLOGIC HISTORY: Oncology History  Multiple myeloma ([place])  11/11/2018 Imaging   MRI right shoulder for arm pain showed innumerable enhancing lesions throughout the right humerus, ribs and right scapula; no fractures   11/29/2018 Miscellaneous   Baseline labs -Hgb 10, Ca 12.2, Cr 2.89, albumin 2.9 -M-spike 3.7 g/dL, free lambda 7.8, monoclonal IgG lambda on IFE, quant IgG 5602, LDH 180, B2M 6.8   01/09/2019 - 05/14/2019 Chemotherapy   The patient had bortezomib SQ (VELCADE) chemo injection 2.5 mg, 1.3 mg/m2 = 2.5 mg, Subcutaneous,  Once, 6 of 10 cycles Administration: 2.5 mg (01/09/2019), 2.5 mg (01/16/2019), 2.5 mg (01/23/2019), 2.5 mg (01/30/2019), 2.5 mg (02/06/2019), 2.5 mg (02/13/2019), 2.5 mg (02/20/2019), 2.5 mg (02/27/2019), 2.5 mg (03/06/2019), 2.5 mg (03/13/2019), 2.5 mg (03/20/2019), 2.5 mg (03/27/2019), 2.5 mg (04/03/2019), 2.5 mg (04/10/2019), 2.5 mg (04/17/2019), 2.5 mg (04/24/2019), 2.5 mg (05/01/2019), 2.5 mg (05/08/2019)  for chemotherapy treatment.    05/29/2019 - 07/23/2019 Chemotherapy   The patient had  bortezomib SQ (VELCADE) chemo injection 2.75 mg, 1.5 mg/m2 = 2.75 mg, Subcutaneous,  Once, 2 of 4 cycles Administration: 2.75 mg (05/29/2019), 2.75 mg (06/05/2019), 2.75 mg (06/12/2019), 2.75 mg (06/19/2019), 2.75 mg (06/26/2019), 2.75 mg (07/03/2019), 2.75 mg (07/10/2019), 2.75 mg (07/17/2019)  for chemotherapy treatment.    07/29/2019 -  Chemotherapy   The patient had daratumumab (DARZALEX) 600 mg in sodium chloride 0.9 % 470 mL (1.2 mg/mL) chemo infusion, 7.8 mg/kg = 620 mg, Intravenous, Once, 1 of 1 cycle Administration: 600 mg (07/29/2019), 600 mg (07/30/2019), 1,200 mg (08/06/2019) daratumumab (DARZALEX) 1,200 mg in sodium chloride 0.9 % 440 mL chemo infusion, 15.7 mg/kg = 1,220 mg, Intravenous, Once, 1 of 7 cycles Administration: 1,200 mg (08/12/2019)  for chemotherapy treatment.      I have reviewed  the past medical history, past surgical history, social history and family history with the patient and they are unchanged from previous note.  ALLERGIES:  has No Known Allergies.  MEDICATIONS:  Current Outpatient Medications  Medication Sig Dispense Refill  . acyclovir (ZOVIRAX) 400 MG tablet Take 1 tablet (400 mg total) by mouth 2 (two) times daily. (Patient taking differently: Take 400 mg by mouth 2 (two) times daily as needed (fever blisters). ) 60 tablet 3  . aspirin EC 81 MG tablet Take 81 mg by mouth daily.    Marland Kitchen CALCIUM-VITAMIN D PO Take 1 tablet by mouth 2 (two) times a day.    Marland Kitchen dexamethasone (DECADRON) 4 MG tablet Take 5 tablets (2m) on days 1, 8, 15 and 22 of each cycle Take 1 tab (436m for 2 days after each dose of daratumumab 30 tablet 11  . lidocaine-prilocaine (EMLA) cream Apply to affected area once 30 g 3  . LORazepam (ATIVAN) 0.5 MG tablet Take 1 tablet (0.5 mg total) by mouth every 6 (six) hours as needed (Nausea or vomiting). 30 tablet 0  . montelukast (SINGULAIR) 10 MG tablet Take 1 tablet (10 mg total) by mouth at bedtime. 30 tablet 6  . Multiple Vitamin (MULTIVITAMIN WITH MINERALS) TABS Take 1 tablet by mouth daily.    . ondansetron (ZOFRAN ODT) 4 MG disintegrating tablet Take 1 tablet (4 mg total) by mouth every 8 (eight) hours as needed. 20 tablet 0  . ondansetron (ZOFRAN) 8 MG tablet Take 1 tablet (8 mg total) by mouth 2 (two) times daily as needed (Nausea or vomiting). 30 tablet 1  . pomalidomide (POMALYST) 4 MG capsule TAKE 1 CAPSULE BY MOUTH  DAILY FOR 21 DAYS ON, THEN  7 DAYS OFF . TAKE WITH  WATER. 21 capsule 0  . prochlorperazine (COMPAZINE) 10 MG tablet Take 1 tablet (10 mg total) by mouth every 6 (six) hours as needed (Nausea or vomiting). 30 tablet 1  . azithromycin (ZITHROMAX) 250 MG tablet Take 2 tabs on Day 1, and then 1 tab daily for 4 days 6 each 0   No current facility-administered medications for this visit.    Facility-Administered Medications  Ordered in Other Visits  Medication Dose Route Frequency Provider Last Rate Last Dose  . 0.9 %  sodium chloride infusion   Intravenous Once ZhTish MenMD      . acetaminophen (TYLENOL) tablet 650 mg  650 mg Oral Once ZhTish MenMD      . [facility] in sodium chloride 0.9 % 439 mL chemo infusion  16 mg/kg (Treatment Plan Recorded) Intravenous Once ZhTish MenMD      . diphenhydrAMINE (BENADRYL) capsule 50 mg  50 mg Oral Once [name], MD        PHYSICAL EXAMINATION: ECOG PERFORMANCE STATUS: 1 - Symptomatic but completely ambulatory  Today's Vitals   08/19/19 0849  BP: 113/74  Pulse: 68  Resp: 17  Temp: (!) 97.3 F (36.3 C)  TempSrc: Temporal  SpO2: 100%  Weight: 167 lb 0.6 oz (75.8 kg)  Height: _0  (1.753 m)  PainSc: 4    Body mass index is 24.67 kg/m.  Filed Weights   08/19/19 0849  Weight: 167 lb 0.6 oz (75.8 kg)    GENERAL: alert, no distress and comfortable SKIN: a small right lower eyelid lesion, non-draining, ~1cm, non-tender  EYES: conjunctiva are pink and non-injected, sclera clear OROPHARYNX: no exudate, no erythema; lips, buccal mucosa, and tongue normal  NECK: supple, non-tender LUNGS: clear to auscultation with normal breathing effort HEART: regular rate & rhythm and no murmurs and no lower extremity edema ABDOMEN: soft, non-tender, non-distended, normal bowel sounds Musculoskeletal: no cyanosis of digits and no clubbing  PSYCH: alert & oriented x 3, fluent speech NEURO: no focal motor/sensory deficits  LABORATORY DATA:  I have reviewed the data as listed    Component Value Date/Time   NA 138 08/19/2019 0829   K 4.3 08/19/2019 0829   CL 104 08/19/2019 0829   CO2 26 08/19/2019 0829   GLUCOSE 107 (H) 08/19/2019 0829   BUN 28 (H) 08/19/2019 0829   CREATININE 0.87 08/19/2019 0829   CALCIUM 9.1 08/19/2019 0829   PROT 6.2 (L) 08/19/2019 0829   ALBUMIN 3.8 08/19/2019 0829   AST 14 (L) 08/19/2019 0829   ALT 17 08/19/2019 0829    ALKPHOS 74 08/19/2019 0829   BILITOT 0.7 08/19/2019 0829   GFRNONAA >60 08/19/2019 0829   GFRAA >60 08/19/2019 0829    No results found for: SPEP, UPEP  Lab Results  Component Value Date   WBC 1.0 (L) 08/19/2019   NEUTROABS 0.6 (L) 08/19/2019   HGB 13.1 08/19/2019   HCT 37.6 (L) 08/19/2019   MCV 95.7 08/19/2019   PLT 118 (L) 08/19/2019      Chemistry      Component Value Date/Time   NA 138 08/19/2019 0829   K 4.3 08/19/2019 0829   CL 104 08/19/2019 0829   CO2 26 08/19/2019 0829   BUN 28 (H) 08/19/2019 0829   CREATININE 0.87 08/19/2019 0829      Component Value Date/Time   CALCIUM 9.1 08/19/2019 0829   ALKPHOS 74 08/19/2019 0829   AST 14 (L) 08/19/2019 0829   ALT 17 08/19/2019 0829   BILITOT 0.7 08/19/2019 0829       RADIOGRAPHIC STUDIES: I have personally reviewed the radiological images as listed below and agreed with the findings in the report. Ir Imaging Guided Port Insertion  Result Date: 07/25/2019 INDICATION: 63 year old with multiple myeloma. Port-A-Cath needed for treatment. EXAM: FLUOROSCOPIC AND ULTRASOUND GUIDED PLACEMENT OF A SUBCUTANEOUS PORT COMPARISON:  None. MEDICATIONS: Ancef 2 g; The antibiotic was administered within an appropriate time interval prior to skin puncture. ANESTHESIA/SEDATION: Versed 2.0 mg IV; Fentanyl 100 mcg IV; Moderate Sedation Time:  29 minutes The patient was continuously monitored during the procedure by the interventional radiology nurse under my direct supervision. FLUOROSCOPY TIME:  24 seconds, 2 mGy COMPLICATIONS: None immediate. PROCEDURE: The procedure, risks, benefits, and alternatives were explained to the patient. Questions regarding the procedure were encouraged and answered. The patient understands and consents to the procedure. Patient was placed supine on the interventional table. Ultrasound confirmed  a patent right internal jugular vein. Ultrasound image was saved for documentation. The right chest and neck were cleaned  with a skin antiseptic and a sterile drape was placed. Maximal barrier sterile technique was utilized including caps, mask, sterile gowns, sterile gloves, sterile drape, hand hygiene and skin antiseptic. The right neck was anesthetized with 1% lidocaine. Small incision was made in the right neck with a blade. Micropuncture set was placed in the right internal jugular vein with ultrasound guidance. The micropuncture wire was used for measurement purposes. The right chest was anesthetized with 1% lidocaine with epinephrine. #15 blade was used to make an incision and a subcutaneous port pocket was formed. [place] was assembled. Subcutaneous tunnel was formed with a stiff tunneling device. The port catheter was brought through the subcutaneous tunnel. The port was placed in the subcutaneous pocket. The micropuncture set was exchanged for a peel-away sheath. The catheter was placed through the peel-away sheath and the tip was positioned at the SVC and right atrium junction. Catheter placement was confirmed with fluoroscopy. The port was accessed and flushed with heparinized saline. The port pocket was closed using two layers of absorbable sutures and Dermabond. The vein skin site was closed using a single layer of absorbable suture and Dermabond. Sterile dressings were applied. Patient tolerated the procedure well without an immediate complication. Ultrasound and fluoroscopic images were taken and saved for this procedure. IMPRESSION: Placement of a subcutaneous port device. Catheter tip at the SVC and right atrium junction. Electronically Signed   By: Markus Daft M.D.   On: 07/25/2019 16:54

## 2019-08-19 NOTE — Patient Instructions (Signed)
Daratumumab injection What is this medicine? DARATUMUMAB (dar a toom ue mab) is a monoclonal antibody. It is used to treat multiple myeloma. This medicine may be used for other purposes; ask your health care provider or pharmacist if you have questions. COMMON BRAND NAME(S): DARZALEX What should I tell my health care provider before I take this medicine? They need to know if you have any of these conditions:  infection (especially a virus infection such as chickenpox, herpes, or hepatitis B virus)  lung or breathing disease  an unusual or allergic reaction to daratumumab, other medicines, foods, dyes, or preservatives  pregnant or trying to get pregnant  breast-feeding How should I use this medicine? This medicine is for infusion into a vein. It is given by a health care professional in a hospital or clinic setting. Talk to your pediatrician regarding the use of this medicine in children. Special care may be needed. Overdosage: If you think you have taken too much of this medicine contact a poison control center or emergency room at once. NOTE: This medicine is only for you. Do not share this medicine with others. What if I miss a dose? Keep appointments for follow-up doses as directed. It is important not to miss your dose. Call your doctor or health care professional if you are unable to keep an appointment. What may interact with this medicine? Interactions have not been studied. This list may not describe all possible interactions. Give your health care provider a list of all the medicines, herbs, non-prescription drugs, or dietary supplements you use. Also tell them if you smoke, drink alcohol, or use illegal drugs. Some items may interact with your medicine. What should I watch for while using this medicine? This drug may make you feel generally unwell. Report any side effects. Continue your course of treatment even though you feel ill unless your doctor tells you to stop. This  medicine can cause serious allergic reactions. To reduce your risk you may need to take medicine before treatment with this medicine. Take your medicine as directed. This medicine can affect the results of blood tests to match your blood type. These changes can last for up to 6 months after the final dose. Your healthcare provider will do blood tests to match your blood type before you start treatment. Tell all of your healthcare providers that you are being treated with this medicine before receiving a blood transfusion. This medicine can affect the results of some tests used to determine treatment response; extra tests may be needed to evaluate response. Do not become pregnant while taking this medicine or for 3 months after stopping it. Women should inform their doctor if they wish to become pregnant or think they might be pregnant. There is a potential for serious side effects to an unborn child. Talk to your health care professional or pharmacist for more information. What side effects may I notice from receiving this medicine? Side effects that you should report to your doctor or health care professional as soon as possible:  allergic reactions like skin rash, itching or hives, swelling of the face, lips, or tongue  breathing problems  chills  cough  dizziness  feeling faint or lightheaded  headache  low blood counts - this medicine may decrease the number of white blood cells, red blood cells and platelets. You may be at increased risk for infections and bleeding.  nausea, vomiting  shortness of breath  signs of decreased platelets or bleeding - bruising, pinpoint red spots on  the skin, black, tarry stools, blood in the urine  signs of decreased red blood cells - unusually weak or tired, feeling faint or lightheaded, falls  signs of infection - fever or chills, cough, sore throat, pain or difficulty passing urine  signs and symptoms of liver injury like dark yellow or brown  urine; general ill feeling or flu-like symptoms; light-colored stools; loss of appetite; right upper belly pain; unusually weak or tired; yellowing of the eyes or skin Side effects that usually do not require medical attention (report to your doctor or health care professional if they continue or are bothersome):  back pain  constipation  loss of appetite  diarrhea  joint pain  muscle cramps  pain, tingling, numbness in the hands or feet  swelling of the ankles, feet, hands  tiredness  trouble sleeping This list may not describe all possible side effects. Call your doctor for medical advice about side effects. You may report side effects to FDA at 1-800-FDA-1088. Where should I keep my medicine? Keep out of the reach of children. This drug is given in a hospital or clinic and will not be stored at home. NOTE: This sheet is a summary. It may not cover all possible information. If you have questions about this medicine, talk to your doctor, pharmacist, or health care provider.  2020 Elsevier/Gold Standard (2018-08-22 14:00:48)

## 2019-08-19 NOTE — Patient Instructions (Signed)

## 2019-08-19 NOTE — Telephone Encounter (Signed)
No change in appts per 9/29 los

## 2019-08-26 ENCOUNTER — Inpatient Hospital Stay: Payer: 59

## 2019-08-26 ENCOUNTER — Inpatient Hospital Stay: Payer: 59 | Attending: Hematology

## 2019-08-26 ENCOUNTER — Other Ambulatory Visit: Payer: Self-pay

## 2019-08-26 VITALS — BP 94/42 | HR 60 | Temp 98.0°F | Resp 18

## 2019-08-26 DIAGNOSIS — C9000 Multiple myeloma not having achieved remission: Secondary | ICD-10-CM | POA: Insufficient documentation

## 2019-08-26 DIAGNOSIS — Z7189 Other specified counseling: Secondary | ICD-10-CM

## 2019-08-26 DIAGNOSIS — Z5112 Encounter for antineoplastic immunotherapy: Secondary | ICD-10-CM | POA: Insufficient documentation

## 2019-08-26 LAB — CBC WITH DIFFERENTIAL (CANCER CENTER ONLY)
Abs Immature Granulocytes: 0.01 10*3/uL (ref 0.00–0.07)
Basophils Absolute: 0 10*3/uL (ref 0.0–0.1)
Basophils Relative: 2 %
Eosinophils Absolute: 0.1 10*3/uL (ref 0.0–0.5)
Eosinophils Relative: 4 %
HCT: 34.3 % — ABNORMAL LOW (ref 39.0–52.0)
Hemoglobin: 11.7 g/dL — ABNORMAL LOW (ref 13.0–17.0)
Immature Granulocytes: 1 %
Lymphocytes Relative: 22 %
Lymphs Abs: 0.5 10*3/uL — ABNORMAL LOW (ref 0.7–4.0)
MCH: 33.1 pg (ref 26.0–34.0)
MCHC: 34.1 g/dL (ref 30.0–36.0)
MCV: 96.9 fL (ref 80.0–100.0)
Monocytes Absolute: 0.6 10*3/uL (ref 0.1–1.0)
Monocytes Relative: 26 %
Neutro Abs: 1 10*3/uL — ABNORMAL LOW (ref 1.7–7.7)
Neutrophils Relative %: 45 %
Platelet Count: 158 10*3/uL (ref 150–400)
RBC: 3.54 MIL/uL — ABNORMAL LOW (ref 4.22–5.81)
RDW: 13.4 % (ref 11.5–15.5)
WBC Count: 2.1 10*3/uL — ABNORMAL LOW (ref 4.0–10.5)
nRBC: 0 % (ref 0.0–0.2)

## 2019-08-26 LAB — CMP (CANCER CENTER ONLY)
ALT: 18 U/L (ref 0–44)
AST: 15 U/L (ref 15–41)
Albumin: 3.5 g/dL (ref 3.5–5.0)
Alkaline Phosphatase: 66 U/L (ref 38–126)
Anion gap: 7 (ref 5–15)
BUN: 24 mg/dL — ABNORMAL HIGH (ref 8–23)
CO2: 26 mmol/L (ref 22–32)
Calcium: 8.4 mg/dL — ABNORMAL LOW (ref 8.9–10.3)
Chloride: 104 mmol/L (ref 98–111)
Creatinine: 0.75 mg/dL (ref 0.61–1.24)
GFR, Est AFR Am: >60 mL/min (ref >60)
GFR, Estimated: >60 mL/min (ref >60)
Glucose, Bld: 110 mg/dL — ABNORMAL HIGH (ref 70–99)
Potassium: 3.9 mmol/L (ref 3.5–5.1)
Sodium: 137 mmol/L (ref 135–145)
Total Bilirubin: 0.5 mg/dL (ref 0.3–1.2)
Total Protein: 5.5 g/dL — ABNORMAL LOW (ref 6.5–8.1)

## 2019-08-26 MED ORDER — ACETAMINOPHEN 325 MG PO TABS
650.0000 mg | ORAL_TABLET | Freq: Once | ORAL | Status: AC
  Administered 2019-08-26: 650 mg via ORAL

## 2019-08-26 MED ORDER — DIPHENHYDRAMINE HCL 25 MG PO CAPS
ORAL_CAPSULE | ORAL | Status: AC
  Filled 2019-08-26: qty 2

## 2019-08-26 MED ORDER — SODIUM CHLORIDE 0.9% FLUSH
10.0000 mL | INTRAVENOUS | Status: DC | PRN
  Administered 2019-08-26: 10 mL
  Filled 2019-08-26: qty 10

## 2019-08-26 MED ORDER — SODIUM CHLORIDE 0.9 % IV SOLN
20.0000 mg | Freq: Once | INTRAVENOUS | Status: AC
  Administered 2019-08-26: 20 mg via INTRAVENOUS
  Filled 2019-08-26: qty 20

## 2019-08-26 MED ORDER — DIPHENHYDRAMINE HCL 25 MG PO CAPS
50.0000 mg | ORAL_CAPSULE | Freq: Once | ORAL | Status: AC
  Administered 2019-08-26: 10:00:00 50 mg via ORAL

## 2019-08-26 MED ORDER — SODIUM CHLORIDE 0.9 % IV SOLN
Freq: Once | INTRAVENOUS | Status: AC
  Administered 2019-08-26: 10:00:00 via INTRAVENOUS
  Filled 2019-08-26: qty 250

## 2019-08-26 MED ORDER — SODIUM CHLORIDE 0.9 % IV SOLN
15.7000 mg/kg | Freq: Once | INTRAVENOUS | Status: AC
  Administered 2019-08-26: 1200 mg via INTRAVENOUS
  Filled 2019-08-26: qty 60

## 2019-08-26 MED ORDER — ACETAMINOPHEN 325 MG PO TABS
ORAL_TABLET | ORAL | Status: AC
  Filled 2019-08-26: qty 2

## 2019-08-26 MED ORDER — HEPARIN SOD (PORK) LOCK FLUSH 100 UNIT/ML IV SOLN
500.0000 [IU] | Freq: Once | INTRAVENOUS | Status: AC | PRN
  Administered 2019-08-26: 500 [IU]
  Filled 2019-08-26: qty 5

## 2019-08-26 NOTE — Progress Notes (Signed)
Ok to treat per Dr. Maylon Peppers after reviewing labwork

## 2019-08-27 LAB — KAPPA/LAMBDA LIGHT CHAINS
Kappa free light chain: 7.4 mg/L (ref 3.3–19.4)
Kappa, lambda light chain ratio: 1.8 — ABNORMAL HIGH (ref 0.26–1.65)
Lambda free light chains: 4.1 mg/L — ABNORMAL LOW (ref 5.7–26.3)

## 2019-08-28 LAB — MULTIPLE MYELOMA PANEL, SERUM
Albumin SerPl Elph-Mcnc: 3.1 g/dL (ref 2.9–4.4)
Albumin/Glob SerPl: 1.5 (ref 0.7–1.7)
Alpha 1: 0.2 g/dL (ref 0.0–0.4)
Alpha2 Glob SerPl Elph-Mcnc: 0.6 g/dL (ref 0.4–1.0)
B-Globulin SerPl Elph-Mcnc: 0.8 g/dL (ref 0.7–1.3)
Gamma Glob SerPl Elph-Mcnc: 0.4 g/dL (ref 0.4–1.8)
Globulin, Total: 2.1 g/dL — ABNORMAL LOW (ref 2.2–3.9)
IgA: 11 mg/dL — ABNORMAL LOW (ref 61–437)
IgG (Immunoglobin G), Serum: 516 mg/dL — ABNORMAL LOW (ref 603–1613)
IgM (Immunoglobulin M), Srm: 20 mg/dL (ref 20–172)
M Protein SerPl Elph-Mcnc: 0.3 g/dL — ABNORMAL HIGH
Total Protein ELP: 5.2 g/dL — ABNORMAL LOW (ref 6.0–8.5)

## 2019-09-02 ENCOUNTER — Inpatient Hospital Stay: Payer: 59

## 2019-09-02 ENCOUNTER — Other Ambulatory Visit: Payer: Self-pay

## 2019-09-02 VITALS — BP 95/50 | HR 49 | Temp 97.1°F | Resp 18

## 2019-09-02 DIAGNOSIS — Z7189 Other specified counseling: Secondary | ICD-10-CM

## 2019-09-02 DIAGNOSIS — C9000 Multiple myeloma not having achieved remission: Secondary | ICD-10-CM

## 2019-09-02 DIAGNOSIS — Z5112 Encounter for antineoplastic immunotherapy: Secondary | ICD-10-CM | POA: Diagnosis not present

## 2019-09-02 LAB — CBC WITH DIFFERENTIAL (CANCER CENTER ONLY)
Abs Immature Granulocytes: 0.03 10*3/uL (ref 0.00–0.07)
Basophils Absolute: 0 10*3/uL (ref 0.0–0.1)
Basophils Relative: 1 %
Eosinophils Absolute: 0.1 10*3/uL (ref 0.0–0.5)
Eosinophils Relative: 4 %
HCT: 35.3 % — ABNORMAL LOW (ref 39.0–52.0)
Hemoglobin: 12.2 g/dL — ABNORMAL LOW (ref 13.0–17.0)
Immature Granulocytes: 1 %
Lymphocytes Relative: 2 %
Lymphs Abs: 0.1 10*3/uL — ABNORMAL LOW (ref 0.7–4.0)
MCH: 33.4 pg (ref 26.0–34.0)
MCHC: 34.6 g/dL (ref 30.0–36.0)
MCV: 96.7 fL (ref 80.0–100.0)
Monocytes Absolute: 0.2 10*3/uL (ref 0.1–1.0)
Monocytes Relative: 6 %
Neutro Abs: 3.4 10*3/uL (ref 1.7–7.7)
Neutrophils Relative %: 86 %
Platelet Count: 144 10*3/uL — ABNORMAL LOW (ref 150–400)
RBC: 3.65 MIL/uL — ABNORMAL LOW (ref 4.22–5.81)
RDW: 13.4 % (ref 11.5–15.5)
WBC Count: 3.9 10*3/uL — ABNORMAL LOW (ref 4.0–10.5)
nRBC: 0 % (ref 0.0–0.2)

## 2019-09-02 LAB — CMP (CANCER CENTER ONLY)
ALT: 16 U/L (ref 0–44)
AST: 14 U/L — ABNORMAL LOW (ref 15–41)
Albumin: 3.7 g/dL (ref 3.5–5.0)
Alkaline Phosphatase: 61 U/L (ref 38–126)
Anion gap: 7 (ref 5–15)
BUN: 22 mg/dL (ref 8–23)
CO2: 25 mmol/L (ref 22–32)
Calcium: 8.7 mg/dL — ABNORMAL LOW (ref 8.9–10.3)
Chloride: 104 mmol/L (ref 98–111)
Creatinine: 0.82 mg/dL (ref 0.61–1.24)
GFR, Est AFR Am: >60 mL/min (ref >60)
GFR, Estimated: >60 mL/min (ref >60)
Glucose, Bld: 112 mg/dL — ABNORMAL HIGH (ref 70–99)
Potassium: 4.1 mmol/L (ref 3.5–5.1)
Sodium: 136 mmol/L (ref 135–145)
Total Bilirubin: 0.7 mg/dL (ref 0.3–1.2)
Total Protein: 5.7 g/dL — ABNORMAL LOW (ref 6.5–8.1)

## 2019-09-02 MED ORDER — DIPHENHYDRAMINE HCL 25 MG PO CAPS
ORAL_CAPSULE | ORAL | Status: AC
  Filled 2019-09-02: qty 2

## 2019-09-02 MED ORDER — SODIUM CHLORIDE 0.9 % IV SOLN
Freq: Once | INTRAVENOUS | Status: AC
  Administered 2019-09-02: 09:00:00 via INTRAVENOUS
  Filled 2019-09-02: qty 250

## 2019-09-02 MED ORDER — HEPARIN SOD (PORK) LOCK FLUSH 100 UNIT/ML IV SOLN
500.0000 [IU] | Freq: Once | INTRAVENOUS | Status: AC | PRN
  Administered 2019-09-02: 500 [IU]
  Filled 2019-09-02: qty 5

## 2019-09-02 MED ORDER — DIPHENHYDRAMINE HCL 25 MG PO CAPS
50.0000 mg | ORAL_CAPSULE | Freq: Once | ORAL | Status: AC
  Administered 2019-09-02: 50 mg via ORAL

## 2019-09-02 MED ORDER — SODIUM CHLORIDE 0.9% FLUSH
10.0000 mL | INTRAVENOUS | Status: DC | PRN
  Administered 2019-09-02: 10 mL
  Filled 2019-09-02: qty 10

## 2019-09-02 MED ORDER — SODIUM CHLORIDE 0.9 % IV SOLN
20.0000 mg | Freq: Once | INTRAVENOUS | Status: AC
  Administered 2019-09-02: 20 mg via INTRAVENOUS
  Filled 2019-09-02: qty 20

## 2019-09-02 MED ORDER — ACETAMINOPHEN 325 MG PO TABS
ORAL_TABLET | ORAL | Status: AC
  Filled 2019-09-02: qty 2

## 2019-09-02 MED ORDER — SODIUM CHLORIDE 0.9 % IV SOLN
15.7000 mg/kg | Freq: Once | INTRAVENOUS | Status: AC
  Administered 2019-09-02: 1200 mg via INTRAVENOUS
  Filled 2019-09-02: qty 60

## 2019-09-02 MED ORDER — ACETAMINOPHEN 325 MG PO TABS
650.0000 mg | ORAL_TABLET | Freq: Once | ORAL | Status: AC
  Administered 2019-09-02: 650 mg via ORAL

## 2019-09-02 NOTE — Patient Instructions (Signed)
Implanted Port Insertion, Care After This sheet gives you information about how to care for yourself after your procedure. Your health care provider may also give you more specific instructions. If you have problems or questions, contact your health care provider. What can I expect after the procedure? After the procedure, it is common to have:  Discomfort at the port insertion site.  Bruising on the skin over the port. This should improve over 3-4 days. Follow these instructions at home: Port care  After your port is placed, you will get a manufacturer's information card. The card has information about your port. Keep this card with you at all times.  Take care of the port as told by your health care provider. Ask your health care provider if you or a family member can get training for taking care of the port at home. A home health care nurse may also take care of the port.  Make sure to remember what type of port you have. Incision care      Follow instructions from your health care provider about how to take care of your port insertion site. Make sure you: ? Wash your hands with soap and water before and after you change your bandage (dressing). If soap and water are not available, use hand sanitizer. ? Change your dressing as told by your health care provider. ? Leave stitches (sutures), skin glue, or adhesive strips in place. These skin closures may need to stay in place for 2 weeks or longer. If adhesive strip edges start to loosen and curl up, you may trim the loose edges. Do not remove adhesive strips completely unless your health care provider tells you to do that.  Check your port insertion site every day for signs of infection. Check for: ? Redness, swelling, or pain. ? Fluid or blood. ? Warmth. ? Pus or a bad smell. Activity  Return to your normal activities as told by your health care provider. Ask your health care provider what activities are safe for you.  Do not  lift anything that is heavier than 10 lb (4.5 kg), or the limit that you are told, until your health care provider says that it is safe. General instructions  Take over-the-counter and prescription medicines only as told by your health care provider.  Do not take baths, swim, or use a hot tub until your health care provider approves. Ask your health care provider if you may take showers. You may only be allowed to take sponge baths.  Do not drive for 24 hours if you were given a sedative during your procedure.  Wear a medical alert bracelet in case of an emergency. This will tell any health care providers that you have a port.  Keep all follow-up visits as told by your health care provider. This is important. Contact a health care provider if:  You cannot flush your port with saline as directed, or you cannot draw blood from the port.  You have a fever or chills.  You have redness, swelling, or pain around your port insertion site.  You have fluid or blood coming from your port insertion site.  Your port insertion site feels warm to the touch.  You have pus or a bad smell coming from the port insertion site. Get help right away if:  You have chest pain or shortness of breath.  You have bleeding from your port that you cannot control. Summary  Take care of the port as told by your health   care provider. Keep the manufacturer's information card with you at all times.  Change your dressing as told by your health care provider.  Contact a health care provider if you have a fever or chills or if you have redness, swelling, or pain around your port insertion site.  Keep all follow-up visits as told by your health care provider. This information is not intended to replace advice given to you by your health care provider. Make sure you discuss any questions you have with your health care provider. Document Released: 08/27/2013 Document Revised: 06/04/2018 Document Reviewed: 06/04/2018  Elsevier Patient Education  2020 [employer]  

## 2019-09-02 NOTE — Patient Instructions (Signed)
Daratumumab injection What is this medicine? DARATUMUMAB (dar a toom ue mab) is a monoclonal antibody. It is used to treat multiple myeloma. This medicine may be used for other purposes; ask your health care provider or pharmacist if you have questions. COMMON BRAND NAME(S): DARZALEX What should I tell my health care provider before I take this medicine? They need to know if you have any of these conditions:  infection (especially a virus infection such as chickenpox, herpes, or hepatitis B virus)  lung or breathing disease  an unusual or allergic reaction to daratumumab, other medicines, foods, dyes, or preservatives  pregnant or trying to get pregnant  breast-feeding How should I use this medicine? This medicine is for infusion into a vein. It is given by a health care professional in a hospital or clinic setting. Talk to your pediatrician regarding the use of this medicine in children. Special care may be needed. Overdosage: If you think you have taken too much of this medicine contact a poison control center or emergency room at once. NOTE: This medicine is only for you. Do not share this medicine with others. What if I miss a dose? Keep appointments for follow-up doses as directed. It is important not to miss your dose. Call your doctor or health care professional if you are unable to keep an appointment. What may interact with this medicine? Interactions have not been studied. This list may not describe all possible interactions. Give your health care provider a list of all the medicines, herbs, non-prescription drugs, or dietary supplements you use. Also tell them if you smoke, drink alcohol, or use illegal drugs. Some items may interact with your medicine. What should I watch for while using this medicine? This drug may make you feel generally unwell. Report any side effects. Continue your course of treatment even though you feel ill unless your doctor tells you to stop. This  medicine can cause serious allergic reactions. To reduce your risk you may need to take medicine before treatment with this medicine. Take your medicine as directed. This medicine can affect the results of blood tests to match your blood type. These changes can last for up to 6 months after the final dose. Your healthcare provider will do blood tests to match your blood type before you start treatment. Tell all of your healthcare providers that you are being treated with this medicine before receiving a blood transfusion. This medicine can affect the results of some tests used to determine treatment response; extra tests may be needed to evaluate response. Do not become pregnant while taking this medicine or for 3 months after stopping it. Women should inform their doctor if they wish to become pregnant or think they might be pregnant. There is a potential for serious side effects to an unborn child. Talk to your health care professional or pharmacist for more information. What side effects may I notice from receiving this medicine? Side effects that you should report to your doctor or health care professional as soon as possible:  allergic reactions like skin rash, itching or hives, swelling of the face, lips, or tongue  breathing problems  chills  cough  dizziness  feeling faint or lightheaded  headache  low blood counts - this medicine may decrease the number of white blood cells, red blood cells and platelets. You may be at increased risk for infections and bleeding.  nausea, vomiting  shortness of breath  signs of decreased platelets or bleeding - bruising, pinpoint red spots on  the skin, black, tarry stools, blood in the urine  signs of decreased red blood cells - unusually weak or tired, feeling faint or lightheaded, falls  signs of infection - fever or chills, cough, sore throat, pain or difficulty passing urine  signs and symptoms of liver injury like dark yellow or brown  urine; general ill feeling or flu-like symptoms; light-colored stools; loss of appetite; right upper belly pain; unusually weak or tired; yellowing of the eyes or skin Side effects that usually do not require medical attention (report to your doctor or health care professional if they continue or are bothersome):  back pain  constipation  loss of appetite  diarrhea  joint pain  muscle cramps  pain, tingling, numbness in the hands or feet  swelling of the ankles, feet, hands  tiredness  trouble sleeping This list may not describe all possible side effects. Call your doctor for medical advice about side effects. You may report side effects to FDA at 1-800-FDA-1088. Where should I keep my medicine? Keep out of the reach of children. This drug is given in a hospital or clinic and will not be stored at home. NOTE: This sheet is a summary. It may not cover all possible information. If you have questions about this medicine, talk to your doctor, pharmacist, or health care provider.  2020 Elsevier/Gold Standard (2018-08-22 14:00:48)

## 2019-09-09 ENCOUNTER — Inpatient Hospital Stay: Payer: 59

## 2019-09-09 ENCOUNTER — Other Ambulatory Visit: Payer: Self-pay | Admitting: Hematology

## 2019-09-09 ENCOUNTER — Other Ambulatory Visit: Payer: Self-pay

## 2019-09-09 ENCOUNTER — Ambulatory Visit (HOSPITAL_BASED_OUTPATIENT_CLINIC_OR_DEPARTMENT_OTHER)
Admission: RE | Admit: 2019-09-09 | Discharge: 2019-09-09 | Disposition: A | Discharge status: Home / Self Care | Payer: 59 | Source: Ambulatory Visit | Attending: Hematology | Admitting: Hematology

## 2019-09-09 VITALS — BP 102/56 | HR 56 | Temp 97.3°F | Resp 18

## 2019-09-09 DIAGNOSIS — Z7189 Other specified counseling: Secondary | ICD-10-CM

## 2019-09-09 DIAGNOSIS — R6883 Chills (without fever): Secondary | ICD-10-CM

## 2019-09-09 DIAGNOSIS — Z5112 Encounter for antineoplastic immunotherapy: Secondary | ICD-10-CM | POA: Diagnosis not present

## 2019-09-09 DIAGNOSIS — C9000 Multiple myeloma not having achieved remission: Secondary | ICD-10-CM

## 2019-09-09 LAB — CBC WITH DIFFERENTIAL (CANCER CENTER ONLY)
Abs Immature Granulocytes: 0.03 10*3/uL (ref 0.00–0.07)
Basophils Absolute: 0 10*3/uL (ref 0.0–0.1)
Basophils Relative: 1 %
Eosinophils Absolute: 0.3 10*3/uL (ref 0.0–0.5)
Eosinophils Relative: 10 %
HCT: 34.2 % — ABNORMAL LOW (ref 39.0–52.0)
Hemoglobin: 11.7 g/dL — ABNORMAL LOW (ref 13.0–17.0)
Immature Granulocytes: 1 %
Lymphocytes Relative: 4 %
Lymphs Abs: 0.1 10*3/uL — ABNORMAL LOW (ref 0.7–4.0)
MCH: 32.9 pg (ref 26.0–34.0)
MCHC: 34.2 g/dL (ref 30.0–36.0)
MCV: 96.1 fL (ref 80.0–100.0)
Monocytes Absolute: 0.3 10*3/uL (ref 0.1–1.0)
Monocytes Relative: 13 %
Neutro Abs: 1.7 10*3/uL (ref 1.7–7.7)
Neutrophils Relative %: 71 %
Platelet Count: 170 10*3/uL (ref 150–400)
RBC: 3.56 MIL/uL — ABNORMAL LOW (ref 4.22–5.81)
RDW: 13.2 % (ref 11.5–15.5)
WBC Count: 2.4 10*3/uL — ABNORMAL LOW (ref 4.0–10.5)
nRBC: 0 % (ref 0.0–0.2)

## 2019-09-09 LAB — CMP (CANCER CENTER ONLY)
ALT: 18 U/L (ref 0–44)
AST: 14 U/L — ABNORMAL LOW (ref 15–41)
Albumin: 3.7 g/dL (ref 3.5–5.0)
Alkaline Phosphatase: 63 U/L (ref 38–126)
Anion gap: 7 (ref 5–15)
BUN: 22 mg/dL (ref 8–23)
CO2: 25 mmol/L (ref 22–32)
Calcium: 8.8 mg/dL — ABNORMAL LOW (ref 8.9–10.3)
Chloride: 102 mmol/L (ref 98–111)
Creatinine: 0.78 mg/dL (ref 0.61–1.24)
GFR, Est AFR Am: >60 mL/min (ref >60)
GFR, Estimated: >60 mL/min (ref >60)
Glucose, Bld: 116 mg/dL — ABNORMAL HIGH (ref 70–99)
Potassium: 4.2 mmol/L (ref 3.5–5.1)
Sodium: 134 mmol/L — ABNORMAL LOW (ref 135–145)
Total Bilirubin: 0.5 mg/dL (ref 0.3–1.2)
Total Protein: 5.5 g/dL — ABNORMAL LOW (ref 6.5–8.1)

## 2019-09-09 LAB — URINALYSIS, COMPLETE (UACMP) WITH MICROSCOPIC
Bacteria, UA: NONE SEEN
Bilirubin Urine: NEGATIVE
Glucose, UA: NEGATIVE mg/dL
Hgb urine dipstick: NEGATIVE
Ketones, ur: NEGATIVE mg/dL
Leukocytes,Ua: NEGATIVE
Nitrite: NEGATIVE
Protein, ur: NEGATIVE mg/dL
RBC / HPF: NONE SEEN RBC/hpf (ref 0–5)
Specific Gravity, Urine: 1.01 (ref 1.005–1.030)
WBC, UA: NONE SEEN WBC/hpf (ref 0–5)
pH: 6 (ref 5.0–8.0)

## 2019-09-09 LAB — TSH: TSH: 0.683 u[IU]/mL (ref 0.320–4.118)

## 2019-09-09 MED ORDER — DIPHENHYDRAMINE HCL 25 MG PO CAPS
50.0000 mg | ORAL_CAPSULE | Freq: Once | ORAL | Status: AC
  Administered 2019-09-09: 10:00:00 50 mg via ORAL

## 2019-09-09 MED ORDER — DIPHENHYDRAMINE HCL 25 MG PO CAPS
ORAL_CAPSULE | ORAL | Status: AC
  Filled 2019-09-09: qty 2

## 2019-09-09 MED ORDER — SODIUM CHLORIDE 0.9 % IV SOLN
15.7000 mg/kg | Freq: Once | INTRAVENOUS | Status: AC
  Administered 2019-09-09: 12:00:00 1200 mg via INTRAVENOUS
  Filled 2019-09-09: qty 60

## 2019-09-09 MED ORDER — ACETAMINOPHEN 325 MG PO TABS
ORAL_TABLET | ORAL | Status: AC
  Filled 2019-09-09: qty 2

## 2019-09-09 MED ORDER — ACETAMINOPHEN 325 MG PO TABS
650.0000 mg | ORAL_TABLET | Freq: Once | ORAL | Status: AC
  Administered 2019-09-09: 650 mg via ORAL

## 2019-09-09 MED ORDER — HEPARIN SOD (PORK) LOCK FLUSH 100 UNIT/ML IV SOLN
500.0000 [IU] | Freq: Once | INTRAVENOUS | Status: AC | PRN
  Administered 2019-09-09: 14:00:00 500 [IU]
  Filled 2019-09-09: qty 5

## 2019-09-09 MED ORDER — SODIUM CHLORIDE 0.9% FLUSH
10.0000 mL | INTRAVENOUS | Status: DC | PRN
  Administered 2019-09-09: 14:00:00 10 mL
  Filled 2019-09-09: qty 10

## 2019-09-09 MED ORDER — SODIUM CHLORIDE 0.9 % IV SOLN
Freq: Once | INTRAVENOUS | Status: AC
  Administered 2019-09-09: 10:00:00 via INTRAVENOUS
  Filled 2019-09-09: qty 250

## 2019-09-09 MED ORDER — SODIUM CHLORIDE 0.9 % IV SOLN
20.0000 mg | Freq: Once | INTRAVENOUS | Status: AC
  Administered 2019-09-09: 10:00:00 20 mg via INTRAVENOUS
  Filled 2019-09-09: qty 20

## 2019-09-09 NOTE — Patient Instructions (Signed)

## 2019-09-10 ENCOUNTER — Telehealth: Payer: Self-pay | Admitting: *Deleted

## 2019-09-10 LAB — URINE CULTURE: Culture: NO GROWTH

## 2019-09-10 NOTE — Telephone Encounter (Addendum)
-----   Message from [name], MD sent at 09/10/2019  8:18 AM EDT -----  Per Dr. Maylon Peppers called patient to let him know that his chest X-ray was normal? No answer.  LMAM for patient to call back for results   [place]  ----- Message ----- From: Interface, Rad Results In Sent: 09/10/2019   8:11 AM EDT To: [name], MD

## 2019-09-11 ENCOUNTER — Ambulatory Visit: Payer: 59

## 2019-09-11 ENCOUNTER — Telehealth: Payer: Self-pay | Admitting: *Deleted

## 2019-09-11 NOTE — Telephone Encounter (Signed)
Received a call from Danny Juarez to let us know that he received a call from [facility] stating that he will go on the 29th of October for a batter of tests.  They requested that he hold off on chemo until testing. Will finish the cycle of Pomalyst but cancel chemo on Tuesday Oct 27th.  Dr Maylon Peppers notified.

## 2019-09-14 LAB — CULTURE, BLOOD (SINGLE)
Culture: NO GROWTH
Culture: NO GROWTH
Special Requests: ADEQUATE
Special Requests: ADEQUATE

## 2019-09-15 ENCOUNTER — Other Ambulatory Visit: Payer: Self-pay | Admitting: *Deleted

## 2019-09-15 DIAGNOSIS — C9000 Multiple myeloma not having achieved remission: Secondary | ICD-10-CM

## 2019-09-15 MED ORDER — POMALIDOMIDE 4 MG PO CAPS: ORAL_CAPSULE | ORAL | 0 refills | Status: DC

## 2019-09-15 NOTE — Telephone Encounter (Signed)
Pomalyst refill escribed to [facility] auth# [id number]

## 2019-09-16 ENCOUNTER — Ambulatory Visit: Payer: 59 | Admitting: Family

## 2019-09-16 ENCOUNTER — Other Ambulatory Visit: Payer: 59

## 2019-09-16 ENCOUNTER — Telehealth: Payer: Self-pay | Admitting: *Deleted

## 2019-09-16 ENCOUNTER — Ambulatory Visit: Payer: 59

## 2019-09-16 ENCOUNTER — Ambulatory Visit: Payer: 59 | Admitting: Hematology

## 2019-09-16 NOTE — Telephone Encounter (Signed)
Received call from [place] @ the BMT center @ [facility]. Pt is scheduled for bone marrow transplant evaluation visit on 09/18/19. They have fax'd over a schedule of appts/timeline for his transplant.   Pt is not to have any more chemo, including pomalyst at this time. Provided phone 3 to the [facility] office where pt has been seeing Dr. Maylon Peppers.Marland Kitchen

## 2019-09-16 NOTE — Telephone Encounter (Signed)
If we can fax the schedule to [facility], that'd be great.   Thanks.   Dr. Maylon Peppers

## 2019-09-30 DIAGNOSIS — M8700 Idiopathic aseptic necrosis of unspecified bone: Secondary | ICD-10-CM | POA: Insufficient documentation

## 2019-10-10 DIAGNOSIS — Z9484 Stem cells transplant status: Secondary | ICD-10-CM | POA: Insufficient documentation

## 2019-10-21 ENCOUNTER — Other Ambulatory Visit: Payer: Self-pay | Admitting: *Deleted

## 2019-10-21 ENCOUNTER — Telehealth: Payer: Self-pay | Admitting: Hematology

## 2019-10-21 DIAGNOSIS — C9000 Multiple myeloma not having achieved remission: Secondary | ICD-10-CM

## 2019-10-21 NOTE — Telephone Encounter (Signed)
lmom to inform patient of 1/26 appt at 10 am per 12/1 sch msg

## 2019-10-21 NOTE — Progress Notes (Signed)
Fax from [facility] with lab orders for pt  CBCw/Diff, PLT count, CMET, Magnesium week of 12/15/2019 Message to scheduling for labs/port flush/md visit.

## 2019-10-29 ENCOUNTER — Other Ambulatory Visit: Payer: Self-pay | Admitting: *Deleted

## 2019-10-29 DIAGNOSIS — C9000 Multiple myeloma not having achieved remission: Secondary | ICD-10-CM

## 2019-10-30 ENCOUNTER — Inpatient Hospital Stay: Payer: 59 | Attending: Hematology

## 2019-10-30 ENCOUNTER — Encounter (INDEPENDENT_AMBULATORY_CARE_PROVIDER_SITE_OTHER): Payer: Self-pay

## 2019-10-30 ENCOUNTER — Telehealth: Payer: Self-pay | Admitting: *Deleted

## 2019-10-30 ENCOUNTER — Other Ambulatory Visit: Payer: Self-pay

## 2019-10-30 DIAGNOSIS — C9000 Multiple myeloma not having achieved remission: Secondary | ICD-10-CM | POA: Insufficient documentation

## 2019-10-30 LAB — CBC WITH DIFFERENTIAL (CANCER CENTER ONLY)
Abs Immature Granulocytes: 0.18 10*3/uL — ABNORMAL HIGH (ref 0.00–0.07)
Basophils Absolute: 0 10*3/uL (ref 0.0–0.1)
Basophils Relative: 0 %
Eosinophils Absolute: 0 10*3/uL (ref 0.0–0.5)
Eosinophils Relative: 0 %
HCT: 27.5 % — ABNORMAL LOW (ref 39.0–52.0)
Hemoglobin: 9.5 g/dL — ABNORMAL LOW (ref 13.0–17.0)
Immature Granulocytes: 5 %
Lymphocytes Relative: 13 %
Lymphs Abs: 0.5 10*3/uL — ABNORMAL LOW (ref 0.7–4.0)
MCH: 32.4 pg (ref 26.0–34.0)
MCHC: 34.5 g/dL (ref 30.0–36.0)
MCV: 93.9 fL (ref 80.0–100.0)
Monocytes Absolute: 1.4 10*3/uL — ABNORMAL HIGH (ref 0.1–1.0)
Monocytes Relative: 39 %
Neutro Abs: 1.5 10*3/uL — ABNORMAL LOW (ref 1.7–7.7)
Neutrophils Relative %: 43 %
Platelet Count: 81 10*3/uL — ABNORMAL LOW (ref 150–400)
RBC: 2.93 MIL/uL — ABNORMAL LOW (ref 4.22–5.81)
RDW: 13.9 % (ref 11.5–15.5)
WBC Count: 3.5 10*3/uL — ABNORMAL LOW (ref 4.0–10.5)
nRBC: 0 % (ref 0.0–0.2)

## 2019-10-30 LAB — CMP (CANCER CENTER ONLY)
ALT: 17 U/L (ref 0–44)
AST: 19 U/L (ref 15–41)
Albumin: 3.9 g/dL (ref 3.5–5.0)
Alkaline Phosphatase: 58 U/L (ref 38–126)
Anion gap: 8 (ref 5–15)
BUN: 17 mg/dL (ref 8–23)
CO2: 29 mmol/L (ref 22–32)
Calcium: 9.2 mg/dL (ref 8.9–10.3)
Chloride: 104 mmol/L (ref 98–111)
Creatinine: 0.68 mg/dL (ref 0.61–1.24)
GFR, Est AFR Am: >60 mL/min (ref >60)
GFR, Estimated: >60 mL/min (ref >60)
Glucose, Bld: 113 mg/dL — ABNORMAL HIGH (ref 70–99)
Potassium: 3.8 mmol/L (ref 3.5–5.1)
Sodium: 141 mmol/L (ref 135–145)
Total Bilirubin: 0.3 mg/dL (ref 0.3–1.2)
Total Protein: 5.6 g/dL — ABNORMAL LOW (ref 6.5–8.1)

## 2019-10-30 NOTE — Telephone Encounter (Signed)
Called spoke to pt who advised he is doing well. No concerns at this time. MD reviewed labs, no intervention needed at this time.

## 2019-10-30 NOTE — Telephone Encounter (Signed)
12/10/ lab work faxed to [facility] [phone]

## 2019-12-16 ENCOUNTER — Encounter (INDEPENDENT_AMBULATORY_CARE_PROVIDER_SITE_OTHER): Payer: Self-pay

## 2019-12-16 ENCOUNTER — Inpatient Hospital Stay: Payer: 59 | Attending: Hematology | Admitting: Hematology

## 2019-12-16 ENCOUNTER — Telehealth: Payer: Self-pay | Admitting: Hematology

## 2019-12-16 ENCOUNTER — Inpatient Hospital Stay: Payer: 59

## 2019-12-16 ENCOUNTER — Encounter: Payer: Self-pay | Admitting: Hematology

## 2019-12-16 ENCOUNTER — Other Ambulatory Visit: Payer: Self-pay

## 2019-12-16 VITALS — BP 121/65 | HR 75 | Temp 98.0°F | Resp 19 | Ht 69.0 in | Wt 177.0 lb

## 2019-12-16 DIAGNOSIS — T451X5A Adverse effect of antineoplastic and immunosuppressive drugs, initial encounter: Secondary | ICD-10-CM | POA: Diagnosis not present

## 2019-12-16 DIAGNOSIS — D6959 Other secondary thrombocytopenia: Secondary | ICD-10-CM

## 2019-12-16 DIAGNOSIS — C9000 Multiple myeloma not having achieved remission: Secondary | ICD-10-CM | POA: Diagnosis present

## 2019-12-16 DIAGNOSIS — Z9484 Stem cells transplant status: Secondary | ICD-10-CM | POA: Diagnosis not present

## 2019-12-16 DIAGNOSIS — M84422A Pathological fracture, left humerus, initial encounter for fracture: Secondary | ICD-10-CM | POA: Insufficient documentation

## 2019-12-16 DIAGNOSIS — D6481 Anemia due to antineoplastic chemotherapy: Secondary | ICD-10-CM | POA: Diagnosis not present

## 2019-12-16 DIAGNOSIS — D701 Agranulocytosis secondary to cancer chemotherapy: Secondary | ICD-10-CM | POA: Diagnosis not present

## 2019-12-16 DIAGNOSIS — Z95828 Presence of other vascular implants and grafts: Secondary | ICD-10-CM

## 2019-12-16 DIAGNOSIS — D61810 Antineoplastic chemotherapy induced pancytopenia: Secondary | ICD-10-CM | POA: Diagnosis not present

## 2019-12-16 DIAGNOSIS — Z79899 Other long term (current) drug therapy: Secondary | ICD-10-CM | POA: Insufficient documentation

## 2019-12-16 LAB — CBC WITH DIFFERENTIAL (CANCER CENTER ONLY)
Abs Immature Granulocytes: 0.01 10*3/uL (ref 0.00–0.07)
Basophils Absolute: 0 10*3/uL (ref 0.0–0.1)
Basophils Relative: 1 %
Eosinophils Absolute: 0.1 10*3/uL (ref 0.0–0.5)
Eosinophils Relative: 2 %
HCT: 36.6 % — ABNORMAL LOW (ref 39.0–52.0)
Hemoglobin: 12.5 g/dL — ABNORMAL LOW (ref 13.0–17.0)
Immature Granulocytes: 0 %
Lymphocytes Relative: 17 %
Lymphs Abs: 0.7 10*3/uL (ref 0.7–4.0)
MCH: 31.5 pg (ref 26.0–34.0)
MCHC: 34.2 g/dL (ref 30.0–36.0)
MCV: 92.2 fL (ref 80.0–100.0)
Monocytes Absolute: 0.5 10*3/uL (ref 0.1–1.0)
Monocytes Relative: 13 %
Neutro Abs: 2.6 10*3/uL (ref 1.7–7.7)
Neutrophils Relative %: 67 %
Platelet Count: 118 10*3/uL — ABNORMAL LOW (ref 150–400)
RBC: 3.97 MIL/uL — ABNORMAL LOW (ref 4.22–5.81)
RDW: 12.4 % (ref 11.5–15.5)
WBC Count: 3.9 10*3/uL — ABNORMAL LOW (ref 4.0–10.5)
nRBC: 0 % (ref 0.0–0.2)

## 2019-12-16 LAB — CMP (CANCER CENTER ONLY)
ALT: 17 U/L (ref 0–44)
AST: 24 U/L (ref 15–41)
Albumin: 4.1 g/dL (ref 3.5–5.0)
Alkaline Phosphatase: 58 U/L (ref 38–126)
Anion gap: 9 (ref 5–15)
BUN: 19 mg/dL (ref 8–23)
CO2: 26 mmol/L (ref 22–32)
Calcium: 9.1 mg/dL (ref 8.9–10.3)
Chloride: 106 mmol/L (ref 98–111)
Creatinine: 0.89 mg/dL (ref 0.61–1.24)
GFR, Est AFR Am: >60 mL/min (ref >60)
GFR, Estimated: >60 mL/min (ref >60)
Glucose, Bld: 105 mg/dL — ABNORMAL HIGH (ref 70–99)
Potassium: 4 mmol/L (ref 3.5–5.1)
Sodium: 141 mmol/L (ref 135–145)
Total Bilirubin: 0.4 mg/dL (ref 0.3–1.2)
Total Protein: 6 g/dL — ABNORMAL LOW (ref 6.5–8.1)

## 2019-12-16 LAB — MAGNESIUM: Magnesium: 1.9 mg/dL (ref 1.7–2.4)

## 2019-12-16 MED ORDER — HEPARIN SOD (PORK) LOCK FLUSH 100 UNIT/ML IV SOLN
500.0000 [IU] | Freq: Once | INTRAVENOUS | Status: AC
  Administered 2019-12-16: 10:00:00 500 [IU] via INTRAVENOUS
  Filled 2019-12-16: qty 5

## 2019-12-16 MED ORDER — SODIUM CHLORIDE 0.9% FLUSH
10.0000 mL | INTRAVENOUS | Status: DC | PRN
  Administered 2019-12-16: 10 mL via INTRAVENOUS
  Filled 2019-12-16: qty 10

## 2019-12-16 NOTE — Progress Notes (Signed)
[place] OFFICE PROGRESS NOTE  Patient Care Team: Patient, No Pcp Per as PCP - General (General Practice) Cordelia Poche, RN as Oncology Nurse Navigator [name], MD as Medical Oncologist (Hematology)  HEME/ONC OVERVIEW: 1. 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 and DS  -10/2018: MRI of the right shoulder for arm pain showed innumerable enhancing lesions throughout the right humerus, ribs and right scapula; no fractures -11/2018: baseline labs  Hgb 10, Ca 12.2, Cr 2.89, albumin 2.9  M-spike 3.7 g/dL, free lambda 7.8, monoclonal IgG lambda on IFE, quant IgG 5602, LDH 180, B2M 6.8  PET showed innumerable lytic lesions involving the axial and proximal appendicular skeleton and calvarium; no plasmacytoma   BM bx: normocellular marrow with plasma cell neoplasm (~64% of all cells); myeloma FISH showed trisomy 11 and gain of ATM gene (standard risk) -12/2018 - 04/2019: 1st line q21d RVd, PR   Pre-BMT bone marrow eval showed hypocellular marrow with persistent 10-15% lambda-restricted plasma cells  -05/2019 - 06/2019: 2nd line CyBorD, PD   Disease progression with rising M-spike from 1.1 to 1.4g/dL and quant IgG from 1844 to 2152  -07/2019 - 08/2019: DPd x 2 cycles  -09/2019: melphalan, followed by auto SCT at [facility]   2. 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  Ro[facility]a Fredrickson ASSESSMENT & PLAN:   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 and DS  -[facility] results and pathology reports -[facility]  Post-transplant course was relatively unremarkable, and stem cells engrafted without complication.  -Currently Day 63 from transplant -According to the post-transplant protocol, we will resume q3m66monthometa for up to 2 years after transplant  -[facility], we will coordinate the timing of the maintenance regimen -Post-transplant plan:  Day +60: resume bisphosphonate as tolerated  Day 100: post-transplant assessment of disease response, including labs and PET, and consideration of maintenance Pomalyst/daratumumab.   Ppx: Bactrim and acyclovir   Influenza vaccine at Day +120.  COVID vaccine 4-6 months post-SCT.  Other post-transplant vaccines 6 months post-transplant.   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  -S/p intramedullary nail placement for pathologic left humerus fracture on 04/08/2019 -q3mo33montheta resumed in 11/2019 after completing auto-SCT -Plan for 2 years  -On Ca-Vit D supplement BID   Chemotherapy-associated leukopenia -Secondary to chemotherapy and recent bone marrow transplant  -WBC 3.9kk with ANC 2600, improving  -Patient denies any fever; see treatment of blepharitis below  -We will monitor it closely   Chemotherapy-associated anemia -Secondary to chemotherapy and recent bone marrow transplant  -Hgb 12.5, improving  -Patient denies any symptom of bleeding -We will monitor it closely   Chemotherapy-associated thrombocytopenia -Secondary to chemotherapy and recent bone marrow transplant  -Plts  118k, improving -Patient denies any symptoms of bleeding or excess bruising, such as epistaxis, hematochezia,  melena, or hematuria -We will monitor it closely   Orders Placed This Encounter  Procedures  . Magnesium    Standing Status:   Standing    Number of Occurrences:   20    Standing Expiration Date:   12/15/2020   The total time spent in the appointment was 41 minutes encounter with patients including review of chart and various tests results, discussions about plan of care and coordination of care plan.  All questions were answered. The patient knows to call the clinic with any problems, questions or concerns. No barriers to learning was detected.  Return in 6 weeks for labs, port flush and clinic appt.   [name], MD 1/26/202110:39 AM  CHIEF COMPLAINT: "I am doing fine"  INTERVAL HISTORY: Mr. Mckim returns to clinic for follow-up of multiple myeloma s/p autologous stem cell transplant.  Patient reports that he tolerated bone marrow transplant very well, and did not have any significant side effects.  His cytopenias recovered without any delay.  He has been doing well after the transplant, and he remains very active, including exercising on an elliptical daily.  He denies any complaint today.  REVIEW OF SYSTEMS:   Constitutional: ( - ) fevers, ( - )  chills , ( - ) night sweats Eyes: ( - ) blurriness of vision, ( - ) double vision, ( - ) watery eyes Ears, nose, mouth, throat, and face: ( - ) mucositis, ( - ) sore throat Respiratory: ( - ) cough, ( - ) dyspnea, ( - ) wheezes Cardiovascular: ( - ) palpitation, ( - ) chest discomfort, ( - ) lower extremity swelling Gastrointestinal:  ( - ) nausea, ( - ) heartburn, ( - ) change in bowel habits Skin: ( - ) abnormal skin rashes Lymphatics: ( - ) new lymphadenopathy, ( - ) easy bruising Neurological: ( - ) numbness, ( - ) tingling, ( - ) new weaknesses Behavioral/Psych: ( - ) mood change, ( - ) new changes  All other systems were reviewed with the patient and are negative.  SUMMARY OF ONCOLOGIC HISTORY: Oncology History  Multiple  myeloma ([place])  11/11/2018 Imaging   MRI right shoulder for arm pain showed innumerable enhancing lesions throughout the right humerus, ribs and right scapula; no fractures   11/29/2018 Miscellaneous   Baseline labs -Hgb 10, Ca 12.2, Cr 2.89, albumin 2.9 -M-spike 3.7 g/dL, free lambda 7.8, monoclonal IgG lambda on IFE, quant IgG 5602, LDH 180, B2M 6.8   01/09/2019 - 05/14/2019 Chemotherapy   The patient had bortezomib SQ (VELCADE) chemo injection 2.5 mg, 1.3 mg/m2 = 2.5 mg, Subcutaneous,  Once, 6 of 10 cycles Administration: 2.5 mg (01/09/2019), 2.5 mg (01/16/2019), 2.5 mg (01/23/2019), 2.5 mg (01/30/2019), 2.5 mg (02/06/2019), 2.5 mg (02/13/2019), 2.5 mg (02/20/2019), 2.5 mg (02/27/2019), 2.5 mg (03/06/2019), 2.5 mg (03/13/2019), 2.5 mg (03/20/2019), 2.5 mg (03/27/2019), 2.5 mg (04/03/2019), 2.5 mg (04/10/2019), 2.5 mg (04/17/2019), 2.5 mg (04/24/2019), 2.5 mg (05/01/2019), 2.5 mg (05/08/2019)  for chemotherapy treatment.    05/29/2019 - 07/23/2019 Chemotherapy   The patient had bortezomib SQ (VELCADE) chemo injection 2.75 mg, 1.5 mg/m2 = 2.75 mg, Subcutaneous,  Once, 2 of 4 cycles Administration: 2.75 mg (05/29/2019), 2.75 mg (06/05/2019), 2.75 mg (06/12/2019), 2.75 mg (06/19/2019), 2.75 mg (06/26/2019), 2.75 mg (07/03/2019), 2.75 mg (07/10/2019), 2.75 mg (07/17/2019)  for chemotherapy treatment.    07/29/2019 -  Chemotherapy   The patient  had daratumumab (DARZALEX) 600 mg in sodium chloride 0.9 % 470 mL (1.2 mg/mL) chemo infusion, 7.8 mg/kg = 620 mg, Intravenous, Once, 1 of 1 cycle Administration: 600 mg (07/29/2019), 600 mg (07/30/2019), 1,200 mg (08/06/2019) daratumumab (DARZALEX) 1,200 mg in sodium chloride 0.9 % 440 mL chemo infusion, 15.7 mg/kg = 1,220 mg, Intravenous, Once, 2 of 7 cycles Administration: 1,200 mg (08/12/2019), 1,200 mg (08/19/2019), 1,200 mg (08/26/2019), 1,200 mg (09/02/2019), 1,200 mg (09/09/2019)  for chemotherapy treatment.      I have reviewed the past medical history, past surgical history, social history  and family history with the patient and they are unchanged from previous note.  ALLERGIES:  has No Known Allergies.  MEDICATIONS:  Current Outpatient Medications  Medication Sig Dispense Refill  . acyclovir (ZOVIRAX) 400 MG tablet Take 1 tablet (400 mg total) by mouth 2 (two) times daily. (Patient taking differently: Take 400 mg by mouth 2 (two) times daily as needed (fever blisters). ) 60 tablet 3  . CALCIUM-VITAMIN D PO Take 1 tablet by mouth 2 (two) times a day.    . folic acid (FOLVITE) 1 MG tablet Take by mouth daily.     Marland Kitchen lidocaine-prilocaine (EMLA) cream Apply to affected area once 30 g 3  . Multiple Vitamin (MULTIVITAMIN WITH MINERALS) TABS Take 1 tablet by mouth daily.    Marland Kitchen sulfamethoxazole-trimethoprim (BACTRIM DS) 800-160 MG tablet Take 1 tablet by mouth. Take one tablet by mouth on M/W/F.     No current facility-administered medications for this visit.    PHYSICAL EXAMINATION: ECOG PERFORMANCE STATUS: 0 - Asymptomatic  Today's Vitals   12/16/19 1018  BP: 121/65  Pulse: 75  Resp: 19  Temp: 98 F (36.7 C)  TempSrc: Temporal  SpO2: 98%  Weight: 177 lb 0.6 oz (80.3 kg)  Height: 5' 9" (1.753 m)  PainSc: 0-No pain   Body mass index is 26.14 kg/m.  Filed Weights   12/16/19 1018  Weight: 177 lb 0.6 oz (80.3 kg)    GENERAL: alert, no distress and comfortable SKIN: skin color, texture, turgor are normal, no rashes or significant lesions EYES: conjunctiva are pink and non-injected, sclera clear OROPHARYNX: no exudate, no erythema; lips, buccal mucosa, and tongue normal  NECK: supple, non-tender LUNGS: clear to auscultation with normal breathing effort HEART: regular rate & rhythm and no murmurs and no lower extremity edema ABDOMEN: soft, non-tender, non-distended, normal bowel sounds Musculoskeletal: no cyanosis of digits and no clubbing  PSYCH: alert & oriented x 3, fluent speech NEURO: no focal motor/sensory deficits  LABORATORY DATA:  I have reviewed the data  as listed    Component Value Date/Time   NA 141 12/16/2019 1000   K 4.0 12/16/2019 1000   CL 106 12/16/2019 1000   CO2 26 12/16/2019 1000   GLUCOSE 105 (H) 12/16/2019 1000   BUN 19 12/16/2019 1000   CREATININE 0.89 12/16/2019 1000   CALCIUM 9.1 12/16/2019 1000   PROT 6.0 (L) 12/16/2019 1000   ALBUMIN 4.1 12/16/2019 1000   AST 24 12/16/2019 1000   ALT 17 12/16/2019 1000   ALKPHOS 58 12/16/2019 1000   BILITOT 0.4 12/16/2019 1000   GFRNONAA >60 12/16/2019 1000   GFRAA >60 12/16/2019 1000    No results found for: SPEP, UPEP  Lab Results  Component Value Date   WBC 3.9 (L) 12/16/2019   NEUTROABS 2.6 12/16/2019   HGB 12.5 (L) 12/16/2019   HCT 36.6 (L) 12/16/2019   MCV 92.2 12/16/2019   PLT 118 (L)  12/16/2019      Chemistry      Component Value Date/Time   NA 141 12/16/2019 1000   K 4.0 12/16/2019 1000   CL 106 12/16/2019 1000   CO2 26 12/16/2019 1000   BUN 19 12/16/2019 1000   CREATININE 0.89 12/16/2019 1000      Component Value Date/Time   CALCIUM 9.1 12/16/2019 1000   ALKPHOS 58 12/16/2019 1000   AST 24 12/16/2019 1000   ALT 17 12/16/2019 1000   BILITOT 0.4 12/16/2019 1000       RADIOGRAPHIC STUDIES: I have personally reviewed the radiological images as listed below and agreed with the findings in the report. No results found.

## 2019-12-16 NOTE — Telephone Encounter (Signed)
Appointments scheduled calendar printed per 1/26 los 

## 2019-12-16 NOTE — Patient Instructions (Signed)
Implanted Port Insertion, Care After °This sheet gives you information about how to care for yourself after your procedure. Your health care provider may also give you more specific instructions. If you have problems or questions, contact your health care provider. °What can I expect after the procedure? °After the procedure, it is common to have: °· Discomfort at the port insertion site. °· Bruising on the skin over the port. This should improve over 3-4 days. °Follow these instructions at home: °Port care °· After your port is placed, you will get a manufacturer's information card. The card has information about your port. Keep this card with you at all times. °· Take care of the port as told by your health care provider. Ask your health care provider if you or a family member can get training for taking care of the port at home. A home health care nurse may also take care of the port. °· Make sure to remember what type of port you have. °Incision care ° °  ° °· Follow instructions from your health care provider about how to take care of your port insertion site. Make sure you: °? Wash your hands with soap and water before and after you change your bandage (dressing). If soap and water are not available, use hand sanitizer. °? Change your dressing as told by your health care provider. °? Leave stitches (sutures), skin glue, or adhesive strips in place. These skin closures may need to stay in place for 2 weeks or longer. If adhesive strip edges start to loosen and curl up, you may trim the loose edges. Do not remove adhesive strips completely unless your health care provider tells you to do that. °· Check your port insertion site every day for signs of infection. Check for: °? Redness, swelling, or pain. °? Fluid or blood. °? Warmth. °? Pus or a bad smell. °Activity °· Return to your normal activities as told by your health care provider. Ask your health care provider what activities are safe for you. °· Do not  lift anything that is heavier than 10 lb (4.5 kg), or the limit that you are told, until your health care provider says that it is safe. °General instructions °· Take over-the-counter and prescription medicines only as told by your health care provider. °· Do not take baths, swim, or use a hot tub until your health care provider approves. Ask your health care provider if you may take showers. You may only be allowed to take sponge baths. °· Do not drive for 24 hours if you were given a sedative during your procedure. °· Wear a medical alert bracelet in case of an emergency. This will tell any health care providers that you have a port. °· Keep all follow-up visits as told by your health care provider. This is important. °Contact a health care provider if: °· You cannot flush your port with saline as directed, or you cannot draw blood from the port. °· You have a fever or chills. °· You have redness, swelling, or pain around your port insertion site. °· You have fluid or blood coming from your port insertion site. °· Your port insertion site feels warm to the touch. °· You have pus or a bad smell coming from the port insertion site. °Get help right away if: °· You have chest pain or shortness of breath. °· You have bleeding from your port that you cannot control. °Summary °· Take care of the port as told by your health   care provider. Keep the manufacturer's information card with you at all times. °· Change your dressing as told by your health care provider. °· Contact a health care provider if you have a fever or chills or if you have redness, swelling, or pain around your port insertion site. °· Keep all follow-up visits as told by your health care provider. °This information is not intended to replace advice given to you by your health care provider. Make sure you discuss any questions you have with your health care provider. °Document Revised: 06/04/2018 Document Reviewed: 06/04/2018 °Elsevier Patient Education ©  2020 [employer] ° °

## 2019-12-22 ENCOUNTER — Inpatient Hospital Stay: Payer: 59 | Attending: Hematology

## 2019-12-22 ENCOUNTER — Inpatient Hospital Stay: Payer: 59

## 2019-12-22 ENCOUNTER — Other Ambulatory Visit: Payer: Self-pay

## 2019-12-22 DIAGNOSIS — C9000 Multiple myeloma not having achieved remission: Secondary | ICD-10-CM | POA: Diagnosis present

## 2019-12-22 DIAGNOSIS — Z7189 Other specified counseling: Secondary | ICD-10-CM

## 2019-12-22 LAB — CBC WITH DIFFERENTIAL (CANCER CENTER ONLY)
Abs Immature Granulocytes: 0.01 10*3/uL (ref 0.00–0.07)
Basophils Absolute: 0 10*3/uL (ref 0.0–0.1)
Basophils Relative: 1 %
Eosinophils Absolute: 0.1 10*3/uL (ref 0.0–0.5)
Eosinophils Relative: 1 %
HCT: 35.9 % — ABNORMAL LOW (ref 39.0–52.0)
Hemoglobin: 12.4 g/dL — ABNORMAL LOW (ref 13.0–17.0)
Immature Granulocytes: 0 %
Lymphocytes Relative: 19 %
Lymphs Abs: 0.8 10*3/uL (ref 0.7–4.0)
MCH: 31.3 pg (ref 26.0–34.0)
MCHC: 34.5 g/dL (ref 30.0–36.0)
MCV: 90.7 fL (ref 80.0–100.0)
Monocytes Absolute: 0.7 10*3/uL (ref 0.1–1.0)
Monocytes Relative: 16 %
Neutro Abs: 2.7 10*3/uL (ref 1.7–7.7)
Neutrophils Relative %: 63 %
Platelet Count: 150 10*3/uL (ref 150–400)
RBC: 3.96 MIL/uL — ABNORMAL LOW (ref 4.22–5.81)
RDW: 12.3 % (ref 11.5–15.5)
WBC Count: 4.2 10*3/uL (ref 4.0–10.5)
nRBC: 0 % (ref 0.0–0.2)

## 2019-12-22 LAB — CMP (CANCER CENTER ONLY)
ALT: 19 U/L (ref 0–44)
AST: 23 U/L (ref 15–41)
Albumin: 4 g/dL (ref 3.5–5.0)
Alkaline Phosphatase: 55 U/L (ref 38–126)
Anion gap: 8 (ref 5–15)
BUN: 22 mg/dL (ref 8–23)
CO2: 27 mmol/L (ref 22–32)
Calcium: 9.2 mg/dL (ref 8.9–10.3)
Chloride: 106 mmol/L (ref 98–111)
Creatinine: 0.77 mg/dL (ref 0.61–1.24)
GFR, Est AFR Am: >60 mL/min (ref >60)
GFR, Estimated: >60 mL/min (ref >60)
Glucose, Bld: 95 mg/dL (ref 70–99)
Potassium: 3.8 mmol/L (ref 3.5–5.1)
Sodium: 141 mmol/L (ref 135–145)
Total Bilirubin: 0.5 mg/dL (ref 0.3–1.2)
Total Protein: 5.8 g/dL — ABNORMAL LOW (ref 6.5–8.1)

## 2019-12-22 LAB — MAGNESIUM: Magnesium: 2.1 mg/dL (ref 1.7–2.4)

## 2019-12-22 MED ORDER — SODIUM CHLORIDE 0.9 % IV SOLN
Freq: Once | INTRAVENOUS | Status: AC
  Filled 2019-12-22: qty 250

## 2019-12-22 MED ORDER — ZOLEDRONIC ACID 4 MG/100ML IV SOLN
4.0000 mg | Freq: Once | INTRAVENOUS | Status: AC
  Administered 2019-12-22: 4 mg via INTRAVENOUS
  Filled 2019-12-22: qty 100

## 2019-12-22 NOTE — Patient Instructions (Signed)

## 2020-01-27 ENCOUNTER — Encounter: Payer: Self-pay | Admitting: Hematology

## 2020-01-27 ENCOUNTER — Inpatient Hospital Stay: Payer: 59

## 2020-01-27 ENCOUNTER — Inpatient Hospital Stay (HOSPITAL_BASED_OUTPATIENT_CLINIC_OR_DEPARTMENT_OTHER): Payer: 59 | Admitting: Hematology

## 2020-01-27 ENCOUNTER — Inpatient Hospital Stay: Payer: 59 | Attending: Hematology

## 2020-01-27 ENCOUNTER — Other Ambulatory Visit: Payer: Self-pay

## 2020-01-27 VITALS — BP 112/64 | HR 73 | Temp 98.2°F | Resp 18 | Ht 69.0 in | Wt 169.1 lb

## 2020-01-27 DIAGNOSIS — C9000 Multiple myeloma not having achieved remission: Secondary | ICD-10-CM

## 2020-01-27 DIAGNOSIS — Z9484 Stem cells transplant status: Secondary | ICD-10-CM | POA: Insufficient documentation

## 2020-01-27 DIAGNOSIS — M84422A Pathological fracture, left humerus, initial encounter for fracture: Secondary | ICD-10-CM | POA: Diagnosis not present

## 2020-01-27 DIAGNOSIS — R0789 Other chest pain: Secondary | ICD-10-CM | POA: Diagnosis not present

## 2020-01-27 DIAGNOSIS — T451X5A Adverse effect of antineoplastic and immunosuppressive drugs, initial encounter: Secondary | ICD-10-CM | POA: Insufficient documentation

## 2020-01-27 DIAGNOSIS — D6959 Other secondary thrombocytopenia: Secondary | ICD-10-CM

## 2020-01-27 DIAGNOSIS — Z95828 Presence of other vascular implants and grafts: Secondary | ICD-10-CM

## 2020-01-27 DIAGNOSIS — D701 Agranulocytosis secondary to cancer chemotherapy: Secondary | ICD-10-CM | POA: Diagnosis not present

## 2020-01-27 DIAGNOSIS — Z7189 Other specified counseling: Secondary | ICD-10-CM

## 2020-01-27 LAB — CMP (CANCER CENTER ONLY)
ALT: 20 U/L (ref 0–44)
AST: 24 U/L (ref 15–41)
Albumin: 4.3 g/dL (ref 3.5–5.0)
Alkaline Phosphatase: 58 U/L (ref 38–126)
Anion gap: 7 (ref 5–15)
BUN: 20 mg/dL (ref 8–23)
CO2: 27 mmol/L (ref 22–32)
Calcium: 9.5 mg/dL (ref 8.9–10.3)
Chloride: 105 mmol/L (ref 98–111)
Creatinine: 0.81 mg/dL (ref 0.61–1.24)
GFR, Est AFR Am: >60 mL/min (ref >60)
GFR, Estimated: >60 mL/min (ref >60)
Glucose, Bld: 85 mg/dL (ref 70–99)
Potassium: 4 mmol/L (ref 3.5–5.1)
Sodium: 139 mmol/L (ref 135–145)
Total Bilirubin: 0.5 mg/dL (ref 0.3–1.2)
Total Protein: 5.9 g/dL — ABNORMAL LOW (ref 6.5–8.1)

## 2020-01-27 LAB — CBC WITH DIFFERENTIAL (CANCER CENTER ONLY)
Abs Immature Granulocytes: 0 10*3/uL (ref 0.00–0.07)
Basophils Absolute: 0 10*3/uL (ref 0.0–0.1)
Basophils Relative: 1 %
Eosinophils Absolute: 0 10*3/uL (ref 0.0–0.5)
Eosinophils Relative: 1 %
HCT: 37.7 % — ABNORMAL LOW (ref 39.0–52.0)
Hemoglobin: 13.2 g/dL (ref 13.0–17.0)
Immature Granulocytes: 0 %
Lymphocytes Relative: 23 %
Lymphs Abs: 0.8 10*3/uL (ref 0.7–4.0)
MCH: 31.1 pg (ref 26.0–34.0)
MCHC: 35 g/dL (ref 30.0–36.0)
MCV: 88.9 fL (ref 80.0–100.0)
Monocytes Absolute: 0.7 10*3/uL (ref 0.1–1.0)
Monocytes Relative: 18 %
Neutro Abs: 2.1 10*3/uL (ref 1.7–7.7)
Neutrophils Relative %: 57 %
Platelet Count: 134 10*3/uL — ABNORMAL LOW (ref 150–400)
RBC: 4.24 MIL/uL (ref 4.22–5.81)
RDW: 12.3 % (ref 11.5–15.5)
WBC Count: 3.7 10*3/uL — ABNORMAL LOW (ref 4.0–10.5)
nRBC: 0 % (ref 0.0–0.2)

## 2020-01-27 LAB — MAGNESIUM: Magnesium: 2.1 mg/dL (ref 1.7–2.4)

## 2020-01-27 MED ORDER — HEPARIN SOD (PORK) LOCK FLUSH 100 UNIT/ML IV SOLN
500.0000 [IU] | Freq: Once | INTRAVENOUS | Status: AC
  Administered 2020-01-27: 500 [IU] via INTRAVENOUS
  Filled 2020-01-27: qty 5

## 2020-01-27 MED ORDER — SODIUM CHLORIDE 0.9% FLUSH
10.0000 mL | INTRAVENOUS | Status: DC | PRN
  Administered 2020-01-27: 13:00:00 10 mL via INTRAVENOUS
  Filled 2020-01-27: qty 10

## 2020-01-27 NOTE — Patient Instructions (Signed)
Implanted Port Insertion, Care After °This sheet gives you information about how to care for yourself after your procedure. Your health care provider may also give you more specific instructions. If you have problems or questions, contact your health care provider. °What can I expect after the procedure? °After the procedure, it is common to have: °· Discomfort at the port insertion site. °· Bruising on the skin over the port. This should improve over 3-4 days. °Follow these instructions at home: °Port care °· After your port is placed, you will get a manufacturer's information card. The card has information about your port. Keep this card with you at all times. °· Take care of the port as told by your health care provider. Ask your health care provider if you or a family member can get training for taking care of the port at home. A home health care nurse may also take care of the port. °· Make sure to remember what type of port you have. °Incision care ° °  ° °· Follow instructions from your health care provider about how to take care of your port insertion site. Make sure you: °? Wash your hands with soap and water before and after you change your bandage (dressing). If soap and water are not available, use hand sanitizer. °? Change your dressing as told by your health care provider. °? Leave stitches (sutures), skin glue, or adhesive strips in place. These skin closures may need to stay in place for 2 weeks or longer. If adhesive strip edges start to loosen and curl up, you may trim the loose edges. Do not remove adhesive strips completely unless your health care provider tells you to do that. °· Check your port insertion site every day for signs of infection. Check for: °? Redness, swelling, or pain. °? Fluid or blood. °? Warmth. °? Pus or a bad smell. °Activity °· Return to your normal activities as told by your health care provider. Ask your health care provider what activities are safe for you. °· Do not  lift anything that is heavier than 10 lb (4.5 kg), or the limit that you are told, until your health care provider says that it is safe. °General instructions °· Take over-the-counter and prescription medicines only as told by your health care provider. °· Do not take baths, swim, or use a hot tub until your health care provider approves. Ask your health care provider if you may take showers. You may only be allowed to take sponge baths. °· Do not drive for 24 hours if you were given a sedative during your procedure. °· Wear a medical alert bracelet in case of an emergency. This will tell any health care providers that you have a port. °· Keep all follow-up visits as told by your health care provider. This is important. °Contact a health care provider if: °· You cannot flush your port with saline as directed, or you cannot draw blood from the port. °· You have a fever or chills. °· You have redness, swelling, or pain around your port insertion site. °· You have fluid or blood coming from your port insertion site. °· Your port insertion site feels warm to the touch. °· You have pus or a bad smell coming from the port insertion site. °Get help right away if: °· You have chest pain or shortness of breath. °· You have bleeding from your port that you cannot control. °Summary °· Take care of the port as told by your health   care provider. Keep the manufacturer's information card with you at all times. °· Change your dressing as told by your health care provider. °· Contact a health care provider if you have a fever or chills or if you have redness, swelling, or pain around your port insertion site. °· Keep all follow-up visits as told by your health care provider. °This information is not intended to replace advice given to you by your health care provider. Make sure you discuss any questions you have with your health care provider. °Document Revised: 06/04/2018 Document Reviewed: 06/04/2018 °Elsevier Patient Education ©  2020 [employer] ° °

## 2020-01-27 NOTE — Progress Notes (Signed)
[place] OFFICE PROGRESS NOTE  Patient Care Team: Patient, No Pcp Per as PCP - General ([place]) [name], MD as Medical Oncologist (Hematology)  HEME/ONC OVERVIEW: 1. 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 and DS  -11/2018: baseline labs  Hgb 10, Ca 12.2, Cr 2.89, albumin 2.9  M-spike 3.7 g/dL, free lambda 7.8, monoclonal IgG lambda on IFE, quant IgG 5602, LDH 180, B2M 6.8  PET showed innumerable lytic lesions involving the axial and proximal appendicular skeleton and calvarium; no plasmacytoma   BM bx: normocellular marrow with plasma cell neoplasm (~64% of all cells); myeloma FISH showed trisomy 11 and gain of ATM gene (standard risk) -12/2018 - 04/2019: 1st line q21d RVd, PR   Pre-BMT bone marrow eval showed hypocellular marrow with persistent 10-15% lambda-restricted plasma cells  -05/2019 - 06/2019: 2nd line CyBorD, PD   Disease progression with rising M-spike from 1.1 to 1.4g/dL and quant IgG from 1844 to 2152  -07/2019 - 08/2019: DPd x 2 cycles  -09/2019: melphalan, followed by [facility] SCT at [facility]  01/2020: no FDG-avid bone or soft tissue disease on PET    2. 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 ASSESSMENT & PLAN:   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 and DS  -[facility], lab studies, imaging results and pathology reports -[facility]  Post-transplant course was relatively unremarkable, and stem cells engrafted without complication.  -Zometa resumed in early 12/2019; PET in 01/2020 showed no active myeloma disease  -Currently ~Day 100; [facility] appt with Dr. RoNorma Fredricksonn 02/02/2020  -Pending the evaluation with Dr. RoNorma Fredricksonwe will coordinate the post-transplant maintenance regimen (daratumumab vs daratumumab + Pomalyst) -I have tentatively scheduled an infusion on 02/12/2020, in the event that he needs daratumumab  -Post-transplant plan:  Ppx: Bactrim and acyclovir   Influenza vaccine at Day +120.  COVID vaccine 4-6 months post-SCT.  Other post-transplant vaccines 6 months post-transplant.   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  -S/p intramedullary nail placement for pathologic left humerus fracture on 04/08/2019 -q3m21monthmeta resumed in 12/2019 after completing auto-SCT -Plan for 2 years  -On Ca-Vit D supplement BID   Chemotherapy-associated leukopenia -Secondary to chemotherapy and recent bone marrow transplant  -WBC 3.7k today, stable -Patient denies any symptoms of infection  -We will monitor it closely   Chemotherapy-associated thrombocytopenia -Secondary to chemotherapy and recent bone marrow transplant  -Plts 134k, stable  -Patient denies any symptoms of bleeding or excess bruising, such as epistaxis, hematochezia, melena, or hematuria -We will monitor it closely   Right chest pain/discomfort -Patient reports 1 month of right-sided chest pressure sensation, radiating to the right arm, lasting a few minutes, provoked by exertion and resolves with  rest -No prior cardiac history -Patient denies any chest pain today  -I have ordered baseline echocardiogram, and referred the patient to  cardiology for urgent evaluation   Orders Placed This Encounter  Procedures  . Ambulatory referral to Cardiology    Referral Priority:   Urgent    Referral Type:   Consultation    Referral Reason:   Specialty Services Required    Requested Specialty:   Cardiology    Number of Visits Requested:   1  . ECHOCARDIOGRAM COMPLETE    Standing Status:   Future    Standing Expiration Date:   04/28/2021    Order Specific Question:   Where should this test be performed    Answer:   [facility]    Order Specific Question:   Perflutren DEFINITY (image enhancing agent) should be administered unless hypersensitivity or allergy exist    Answer:   Administer Perflutren    Order Specific Question:   Reason for exam-Echo    Answer:   Chest Pain  786.50 / R07.9    Order Specific Question:   Other Comments    Answer:   Chest pressure sensation radiating to the R arm with exertion   The total time spent in the encounter was 45 minutes, including face-to-face time with the patient, review of various tests results, order additional studies/medications, documentation, and coordination of care plan.   All questions were answered. The patient knows to call the clinic with any problems, questions or concerns. No barriers to learning was detected.  Tentatively return on 02/12/2020 for labs, port flush, clinic:, And infusion.  [name], MD 3/9/20212:34 PM  CHIEF COMPLAINT: "I am doing fine"  INTERVAL HISTORY: [facility] for follow-up of multiple myeloma s/p autologous stem cell transplant.  Patient underwent bone marrow biopsy on 01/21/2020 at [facility], but has not yet received the results.  He is scheduled to meet with Dr. Norma Fredrickson on 02/02/2020 to discuss the results and the next steps.  He reports that over the past month, he has had a throbbing/pressure sensation in the right chest, provoked by exertion (such as walking his dog), and resolves with rest.  He denies any associated  dyspnea, palpitation, diaphoresis, or lightheadedness.  He does not have any prior history of coronary artery disease.  He has not discussed the symptoms with his PCP.  He denies any chest pain today.  REVIEW OF SYSTEMS:   Constitutional: ( - ) fevers, ( - )  chills , ( - ) night sweats Eyes: ( - ) blurriness of vision, ( - ) double vision, ( - ) watery eyes Ears, nose, mouth, throat, and face: ( - ) mucositis, ( - ) sore throat Respiratory: ( - ) cough, ( - ) dyspnea, ( - ) wheezes Cardiovascular: ( - ) palpitation, ( - ) lower extremity swelling Gastrointestinal:  ( - ) nausea, ( - ) heartburn, ( - ) change in bowel habits Skin: ( - ) abnormal skin rashes Lymphatics: ( - ) new lymphadenopathy, ( - ) easy bruising Neurological: ( - ) numbness, ( - ) tingling, ( - ) new weaknesses Behavioral/Psych: ( - ) mood change, ( - ) new changes  All other systems were reviewed with the patient and are negative.  SUMMARY OF ONCOLOGIC HISTORY: Oncology History  Multiple myeloma ([place])  11/11/2018 Imaging   MRI right shoulder for arm pain showed innumerable enhancing lesions throughout the right humerus, ribs and right scapula; no fractures  11/29/2018 Miscellaneous   Baseline labs -Hgb 10, Ca 12.2, Cr 2.89, albumin 2.9 -M-spike 3.7 g/dL, free lambda 7.8, monoclonal IgG lambda on IFE, quant IgG 5602, LDH 180, B2M 6.8   01/09/2019 - 05/14/2019 Chemotherapy   The patient had bortezomib SQ (VELCADE) chemo injection 2.5 mg, 1.3 mg/m2 = 2.5 mg, Subcutaneous,  Once, 6 of 10 cycles Administration: 2.5 mg (01/09/2019), 2.5 mg (01/16/2019), 2.5 mg (01/23/2019), 2.5 mg (01/30/2019), 2.5 mg (02/06/2019), 2.5 mg (02/13/2019), 2.5 mg (02/20/2019), 2.5 mg (02/27/2019), 2.5 mg (03/06/2019), 2.5 mg (03/13/2019), 2.5 mg (03/20/2019), 2.5 mg (03/27/2019), 2.5 mg (04/03/2019), 2.5 mg (04/10/2019), 2.5 mg (04/17/2019), 2.5 mg (04/24/2019), 2.5 mg (05/01/2019), 2.5 mg (05/08/2019)  for chemotherapy treatment.    05/29/2019 - 07/23/2019 Chemotherapy    The patient had bortezomib SQ (VELCADE) chemo injection 2.75 mg, 1.5 mg/m2 = 2.75 mg, Subcutaneous,  Once, 2 of 4 cycles Administration: 2.75 mg (05/29/2019), 2.75 mg (06/05/2019), 2.75 mg (06/12/2019), 2.75 mg (06/19/2019), 2.75 mg (06/26/2019), 2.75 mg (07/03/2019), 2.75 mg (07/10/2019), 2.75 mg (07/17/2019)  for chemotherapy treatment.    07/29/2019 -  Chemotherapy   The patient had daratumumab (DARZALEX) 600 mg in sodium chloride 0.9 % 470 mL (1.2 mg/mL) chemo infusion, 7.8 mg/kg = 620 mg, Intravenous, Once, 1 of 1 cycle Administration: 600 mg (07/29/2019), 600 mg (07/30/2019), 1,200 mg (08/06/2019) daratumumab (DARZALEX) 1,200 mg in sodium chloride 0.9 % 440 mL chemo infusion, 15.7 mg/kg = 1,220 mg, Intravenous, Once, 2 of 7 cycles Administration: 1,200 mg (08/12/2019), 1,200 mg (08/19/2019), 1,200 mg (08/26/2019), 1,200 mg (09/02/2019), 1,200 mg (09/09/2019)  for chemotherapy treatment.      I have reviewed the past medical history, past surgical history, social history and family history with the patient and they are unchanged from previous note.  ALLERGIES:  has No Known Allergies.  MEDICATIONS:  Current Outpatient Medications  Medication Sig Dispense Refill  . acyclovir (ZOVIRAX) 800 MG tablet Take 800 mg by mouth 2 (two) times daily.    . folic acid (FOLVITE) 1 MG tablet Take by mouth daily.     Marland Kitchen lidocaine-prilocaine (EMLA) cream Apply to affected area once 30 g 3  . sulfamethoxazole-trimethoprim (BACTRIM DS) 800-160 MG tablet Take 1 tablet by mouth. Take one tablet by mouth on M/W/F.    Marland Kitchen CALCIUM-VITAMIN D PO Take 1 tablet by mouth 2 (two) times a day.    . Multiple Vitamin (MULTIVITAMIN WITH MINERALS) TABS Take 1 tablet by mouth daily.     No current facility-administered medications for this visit.    PHYSICAL EXAMINATION: ECOG PERFORMANCE STATUS: 1 - Symptomatic but completely ambulatory  Today's Vitals   01/27/20 1336  BP: 112/64  Pulse: 73  Resp: 18  Temp: 98.2 F (36.8 C)   TempSrc: Temporal  SpO2: 99%  Weight: 169 lb 1.3 oz (76.7 kg)  Height: '5\' 9"'  (1.753 m)  PainSc: 0-No pain   Body mass index is 24.97 kg/m.  Filed Weights   01/27/20 1336  Weight: 169 lb 1.3 oz (76.7 kg)    GENERAL: alert, no distress and comfortable SKIN: skin color, texture, turgor are normal, no rashes or significant lesions EYES: conjunctiva are pink and non-injected, sclera clear OROPHARYNX: no exudate, no erythema; lips, buccal mucosa, and tongue normal  NECK: supple, non-tender LUNGS: clear to auscultation with normal breathing effort HEART: regular rate & rhythm and no murmurs and no lower extremity edema ABDOMEN: soft, non-tender, non-distended, normal bowel sounds Musculoskeletal: no cyanosis of digits and no clubbing  PSYCH: alert &  oriented x 3, fluent speech  LABORATORY DATA:  I have reviewed the data as listed    Component Value Date/Time   NA 139 01/27/2020 1300   K 4.0 01/27/2020 1300   CL 105 01/27/2020 1300   CO2 27 01/27/2020 1300   GLUCOSE 85 01/27/2020 1300   BUN 20 01/27/2020 1300   CREATININE 0.81 01/27/2020 1300   CALCIUM 9.5 01/27/2020 1300   PROT 5.9 (L) 01/27/2020 1300   ALBUMIN 4.3 01/27/2020 1300   AST 24 01/27/2020 1300   ALT 20 01/27/2020 1300   ALKPHOS 58 01/27/2020 1300   BILITOT 0.5 01/27/2020 1300   GFRNONAA >60 01/27/2020 1300   GFRAA >60 01/27/2020 1300    No results found for: SPEP, UPEP  Lab Results  Component Value Date   WBC 3.7 (L) 01/27/2020   NEUTROABS 2.1 01/27/2020   HGB 13.2 01/27/2020   HCT 37.7 (L) 01/27/2020   MCV 88.9 01/27/2020   PLT 134 (L) 01/27/2020      Chemistry      Component Value Date/Time   NA 139 01/27/2020 1300   K 4.0 01/27/2020 1300   CL 105 01/27/2020 1300   CO2 27 01/27/2020 1300   BUN 20 01/27/2020 1300   CREATININE 0.81 01/27/2020 1300      Component Value Date/Time   CALCIUM 9.5 01/27/2020 1300   ALKPHOS 58 01/27/2020 1300   AST 24 01/27/2020 1300   ALT 20 01/27/2020 1300    BILITOT 0.5 01/27/2020 1300       RADIOGRAPHIC STUDIES: I have personally reviewed the radiological images as listed below and agreed with the findings in the report. No results found.

## 2020-02-06 ENCOUNTER — Ambulatory Visit (HOSPITAL_BASED_OUTPATIENT_CLINIC_OR_DEPARTMENT_OTHER): Admission: RE | Admit: 2020-02-06 | Payer: 59 | Source: Ambulatory Visit

## 2020-02-12 ENCOUNTER — Inpatient Hospital Stay (HOSPITAL_BASED_OUTPATIENT_CLINIC_OR_DEPARTMENT_OTHER): Payer: 59 | Admitting: Hematology

## 2020-02-12 ENCOUNTER — Other Ambulatory Visit: Payer: Self-pay | Admitting: *Deleted

## 2020-02-12 ENCOUNTER — Inpatient Hospital Stay: Payer: 59

## 2020-02-12 ENCOUNTER — Other Ambulatory Visit: Payer: Self-pay

## 2020-02-12 ENCOUNTER — Encounter: Payer: Self-pay | Admitting: Hematology

## 2020-02-12 VITALS — BP 115/75 | HR 82 | Temp 97.5°F | Wt 169.0 lb

## 2020-02-12 DIAGNOSIS — C9000 Multiple myeloma not having achieved remission: Secondary | ICD-10-CM

## 2020-02-12 DIAGNOSIS — C9001 Multiple myeloma in remission: Secondary | ICD-10-CM

## 2020-02-12 DIAGNOSIS — D701 Agranulocytosis secondary to cancer chemotherapy: Secondary | ICD-10-CM

## 2020-02-12 DIAGNOSIS — Z95828 Presence of other vascular implants and grafts: Secondary | ICD-10-CM

## 2020-02-12 DIAGNOSIS — T451X5A Adverse effect of antineoplastic and immunosuppressive drugs, initial encounter: Secondary | ICD-10-CM

## 2020-02-12 DIAGNOSIS — D6959 Other secondary thrombocytopenia: Secondary | ICD-10-CM

## 2020-02-12 DIAGNOSIS — Z7189 Other specified counseling: Secondary | ICD-10-CM

## 2020-02-12 LAB — CMP (CANCER CENTER ONLY)
ALT: 21 U/L (ref 0–44)
AST: 28 U/L (ref 15–41)
Albumin: 4.2 g/dL (ref 3.5–5.0)
Alkaline Phosphatase: 55 U/L (ref 38–126)
Anion gap: 9 (ref 5–15)
BUN: 21 mg/dL (ref 8–23)
CO2: 26 mmol/L (ref 22–32)
Calcium: 9.2 mg/dL (ref 8.9–10.3)
Chloride: 104 mmol/L (ref 98–111)
Creatinine: 0.91 mg/dL (ref 0.61–1.24)
GFR, Est AFR Am: >60 mL/min (ref >60)
GFR, Estimated: >60 mL/min (ref >60)
Glucose, Bld: 113 mg/dL — ABNORMAL HIGH (ref 70–99)
Potassium: 4.3 mmol/L (ref 3.5–5.1)
Sodium: 139 mmol/L (ref 135–145)
Total Bilirubin: 0.6 mg/dL (ref 0.3–1.2)
Total Protein: 6.1 g/dL — ABNORMAL LOW (ref 6.5–8.1)

## 2020-02-12 LAB — CBC WITH DIFFERENTIAL (CANCER CENTER ONLY)
Abs Immature Granulocytes: 0.01 10*3/uL (ref 0.00–0.07)
Basophils Absolute: 0 10*3/uL (ref 0.0–0.1)
Basophils Relative: 1 %
Eosinophils Absolute: 0.1 10*3/uL (ref 0.0–0.5)
Eosinophils Relative: 2 %
HCT: 39.5 % (ref 39.0–52.0)
Hemoglobin: 13.6 g/dL (ref 13.0–17.0)
Immature Granulocytes: 0 %
Lymphocytes Relative: 20 %
Lymphs Abs: 0.7 10*3/uL (ref 0.7–4.0)
MCH: 30.5 pg (ref 26.0–34.0)
MCHC: 34.4 g/dL (ref 30.0–36.0)
MCV: 88.6 fL (ref 80.0–100.0)
Monocytes Absolute: 0.5 10*3/uL (ref 0.1–1.0)
Monocytes Relative: 15 %
Neutro Abs: 2.2 10*3/uL (ref 1.7–7.7)
Neutrophils Relative %: 62 %
Platelet Count: 124 10*3/uL — ABNORMAL LOW (ref 150–400)
RBC: 4.46 MIL/uL (ref 4.22–5.81)
RDW: 12 % (ref 11.5–15.5)
WBC Count: 3.4 10*3/uL — ABNORMAL LOW (ref 4.0–10.5)
nRBC: 0 % (ref 0.0–0.2)

## 2020-02-12 MED ORDER — SODIUM CHLORIDE 0.9% FLUSH
10.0000 mL | INTRAVENOUS | Status: AC | PRN
  Administered 2020-02-12: 10 mL via INTRAVENOUS
  Filled 2020-02-12: qty 10

## 2020-02-12 MED ORDER — POMALIDOMIDE 2 MG PO CAPS: 2.0000 mg | ORAL_CAPSULE | Freq: Every day | ORAL | 0 refills | Status: DC

## 2020-02-12 MED ORDER — POMALIDOMIDE 2 MG PO CAPS: 2.0000 mg | ORAL_CAPSULE | Freq: Every day | ORAL | 0 refills | Status: AC

## 2020-02-12 MED ORDER — HEPARIN SOD (PORK) LOCK FLUSH 100 UNIT/ML IV SOLN
500.0000 [IU] | Freq: Once | INTRAVENOUS | Status: AC
  Administered 2020-02-12: 500 [IU] via INTRAVENOUS
  Filled 2020-02-12: qty 5

## 2020-02-12 NOTE — Addendum Note (Signed)
Addended by: Rico Ala on: 02/12/2020 09:23 AM   Modules accepted: Orders, SmartSet

## 2020-02-12 NOTE — Progress Notes (Signed)
[place] OFFICE PROGRESS NOTE  Patient Care Team: Patient, No Pcp Per as PCP - General ([place]) [name], MD as Medical Oncologist (Hematology)  HEME/ONC OVERVIEW: 1. 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 and DS  -11/2018: baseline labs  Hgb 10, Ca 12.2, Cr 2.89, albumin 2.9  M-spike 3.7 g/dL, free lambda 7.8, monoclonal IgG lambda on IFE, quant IgG 5602, LDH 180, B2M 6.8  PET showed innumerable lytic lesions involving the axial and proximal appendicular skeleton and calvarium; no plasmacytoma   BM bx: normocellular marrow with plasma cell neoplasm (~64% of all cells); myeloma FISH showed trisomy 11 and gain of ATM gene (standard risk) -12/2018 - 04/2019: 1st line q21d RVd, PR   Pre-BMT bone marrow eval showed hypocellular marrow with persistent 10-15% lambda-restricted plasma cells  -05/2019 - 06/2019: 2nd line CyBorD, PD   Disease progression with rising M-spike from 1.1 to 1.4g/dL and quant IgG from 1844 to 2152  -07/2019 - 08/2019: DPd x 2 cycles   09/2019: melphalan, [facility]  01/2020: no FDG-avid bone or soft tissue disease on PET; bone marrow biopsy negative for plasma cell neoplasm -02/2020 - present: maintenance daratumumab (monthly) with pomalidomide 4m (3 weeks on/1 week off)  2. 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 - present: maintenance SubQ daratumumab q4weeks + Pomalyst 2mg89mily, 3 weeks on/1 week off   ASSESSMENT & PLAN:   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 and DS  -[facility] -In summary, bone marrow biopsy in 01/2020 did not show any evidence of residual plasma cell neoplasm, and the transplant team recommends starting maintenance daratumumab monthly with pomalidomide 2 mg daily, 3 weeks on/1 week off -I have sent a prescription for Pomalyst to the pharmacy with the plan to resume treatment on 02/19/2020 -Patient had previously received IV daratumumab during his induction treatment, but with the approval of SubQ daratumumab, we will plan to switch him to the SubQ form -If he tolerates the first cycle of maintenance therapy well, then we can plan to remove the port -We will monitor his labs weekly during the first cycle of treatment for cytopenias.  If there is no significant cytopenia, then we can monitor his labs monthly at the start of each cycle of maintenance therapy. -On ppx ASA, acyclovir and Bactrim  -Post-transplant care plan:  Dental evaluation twice a year  Bone density scan every 2 years  Monthly SPEP, IFE, free light chains, UPEP x 1 year post-transplant   [facility]Day [facility]   Bactrim x 6 months and acyclovir x 1 year post-transplant   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  -S/p intramedullary nail placement for  pathologic left humerus fracture on 04/08/2019 -q26monthZometa resumed in 12/2019 after completing auto-SCT -Plan for 2 years  -On Ca-Vit D supplement BID   Chemotherapy-associated leukopenia -Secondary to chemotherapy and recent bone marrow transplant  -WBC 3.4k today, stable -Patient denies any symptoms of infection  -We  will monitor it closely   Chemotherapy-associated thrombocytopenia -Secondary to chemotherapy and recent bone marrow transplant  -Plts 124k, stable  -Patient denies any symptoms of bleeding or excess bruising, such as epistaxis, hematochezia, melena, or hematuria -We will monitor it closely   Port-a-cath in place -As he will require weekly labs during the first cycle, we will leave the port in place -If he tolerates it well, then we can remove the port after Cycle 1 of maintenance therapy   Orders Placed This Encounter  Procedures  . Multiple Myeloma Panel (SPEP&IFE w/QIG)    Standing Status:   Standing    Number of Occurrences:   20    Standing Expiration Date:   02/11/2021  . Kappa/lambda light chains    Standing Status:   Standing    Number of Occurrences:   20    Standing Expiration Date:   02/11/2021  . UPEP/UIFE/Light Chains/TP, 24-Hr Ur    Standing Status:   Standing    Number of Occurrences:   20    Standing Expiration Date:   02/11/2021   The total time spent in the encounter was 45 minutes, including face-to-face time with the patient, review of various tests results, order additional studies/medications, documentation, and coordination of care plan.   All questions were answered. The patient knows to call the clinic with any problems, questions or concerns. No barriers to learning was detected.  Return in 1 week to tentatively start maintenance daratumumab and Pomalyst.   YTish Men MD 3/25/20219:49 AM  CHIEF COMPLAINT: "I am doing okay"  INTERVAL HISTORY: Mr. [place]ell[place] to clinic for follow-up of multiple myeloma s/p autologous stem cell transplant.  [facility] given [facility] bone marrow biopsy results, which showed no evidence of residual plasma cell neoplasm in the bone marrow.  He feels well today, and denies any complaint.  REVIEW OF SYSTEMS:   Constitutional: ( - ) fevers, ( - )  chills , ( - )  night sweats Eyes: ( - ) blurriness of vision, ( - ) double vision, ( - ) watery eyes Ears, nose, mouth, throat, and face: ( - ) mucositis, ( - ) sore throat Respiratory: ( - ) cough, ( - ) dyspnea, ( - ) wheezes Cardiovascular: ( - ) palpitation, ( - ) chest discomfort, ( - ) lower extremity swelling Gastrointestinal:  ( - ) nausea, ( - ) heartburn, ( - ) change in bowel habits Skin: ( - ) abnormal skin rashes Lymphatics: ( - ) new lymphadenopathy, ( - ) easy bruising Neurological: ( - ) numbness, ( - ) tingling, ( - ) new weaknesses Behavioral/Psych: ( - ) mood change, ( - ) new changes  All other systems were reviewed with the patient and are negative.  SUMMARY OF ONCOLOGIC HISTORY: Oncology History  [place]  11/11/2018 Imaging   MRI right shoulder for arm pain showed innumerable enhancing lesions throughout the right humerus, ribs and right scapula; no fractures   11/29/2018 Miscellaneous   Baseline labs -Hgb 10, Ca 12.2, Cr 2.89, albumin 2.9 -M-spike 3.7 g/dL, free lambda 7.8, monoclonal IgG lambda on IFE, quant IgG 5602, LDH  180, B2M 6.8   01/09/2019 - 05/14/2019 Chemotherapy   The patient had bortezomib SQ (VELCADE) chemo injection 2.5 mg, 1.3 mg/m2 = 2.5 mg, Subcutaneous,  Once, 6 of 10 cycles Administration: 2.5 mg (01/09/2019), 2.5 mg (01/16/2019), 2.5 mg (01/23/2019), 2.5 mg (01/30/2019), 2.5 mg (02/06/2019), 2.5 mg (02/13/2019), 2.5 mg (02/20/2019), 2.5 mg (02/27/2019), 2.5 mg (03/06/2019), 2.5 mg (03/13/2019), 2.5 mg (03/20/2019), 2.5 mg (03/27/2019), 2.5 mg (04/03/2019), 2.5 mg (04/10/2019), 2.5 mg (04/17/2019), 2.5 mg (04/24/2019), 2.5 mg (05/01/2019), 2.5 mg (05/08/2019)  for chemotherapy treatment.    05/29/2019 - 07/23/2019 Chemotherapy   The patient had bortezomib SQ (VELCADE) chemo injection 2.75 mg, 1.5 mg/m2 = 2.75 mg, Subcutaneous,  Once, 2 of 4 cycles Administration: 2.75 mg (05/29/2019), 2.75 mg (06/05/2019), 2.75 mg (06/12/2019), 2.75 mg (06/19/2019), 2.75 mg (06/26/2019), 2.75 mg  (07/03/2019), 2.75 mg (07/10/2019), 2.75 mg (07/17/2019)  for chemotherapy treatment.    07/29/2019 -  Chemotherapy   The patient had daratumumab (DARZALEX) 600 mg in sodium chloride 0.9 % 470 mL (1.2 mg/mL) chemo infusion, 7.8 mg/kg = 620 mg, Intravenous, Once, 1 of 1 cycle Administration: 600 mg (07/29/2019), 600 mg (07/30/2019), 1,200 mg (08/06/2019) daratumumab (DARZALEX) 1,200 mg in sodium chloride 0.9 % 440 mL chemo infusion, 15.7 mg/kg = 1,220 mg, Intravenous, Once, 2 of 7 cycles Administration: 1,200 mg (08/12/2019), 1,200 mg (08/19/2019), 1,200 mg (08/26/2019), 1,200 mg (09/02/2019), 1,200 mg (09/09/2019)  for chemotherapy treatment.      I have reviewed the past medical history, past surgical history, social history and family history with the patient and they are unchanged from previous note.  ALLERGIES:  has No Known Allergies.  MEDICATIONS:  Current Outpatient Medications  Medication Sig Dispense Refill  . acyclovir (ZOVIRAX) 800 MG tablet Take 800 mg by mouth 2 (two) times daily.    Marland Kitchen CALCIUM-VITAMIN D PO Take 1 tablet by mouth 2 (two) times a day.    . folic acid (FOLVITE) 1 MG tablet Take by mouth daily.     Marland Kitchen lidocaine-prilocaine (EMLA) cream Apply to affected area once 30 g 3  . Multiple Vitamin (MULTIVITAMIN WITH MINERALS) TABS Take 1 tablet by mouth daily.    Marland Kitchen sulfamethoxazole-trimethoprim (BACTRIM DS) 800-160 MG tablet Take 1 tablet by mouth. Take one tablet by mouth on M/W/F.    Marland Kitchen pomalidomide (POMALYST) 2 MG capsule Take 1 capsule (2 mg total) by mouth daily for 21 days. [employer] Auth #      Date Obtained     3 weeks on/1 week off 21 capsule 0   No current facility-administered medications for this visit.   Facility-Administered Medications Ordered in Other Visits  Medication Dose Route Frequency Provider Last Rate Last Admin  . sodium chloride flush (NS) 0.9 % injection 10 mL  10 mL Intravenous PRN [name], MD   10 mL at 02/12/20 0922    PHYSICAL EXAMINATION: ECOG  PERFORMANCE STATUS: 1 - Symptomatic but completely ambulatory  Today's Vitals   02/12/20 0857  BP: 115/75  Pulse: 82  Temp: (!) 97.5 F (36.4 C)  TempSrc: Oral  SpO2: 100%  Weight: 169 lb (76.7 kg)  PainSc: 0-No pain   Body mass index is 24.96 kg/m.  Filed Weights   02/12/20 0857  Weight: 169 lb (76.7 kg)    GENERAL: alert, no distress and comfortable SKIN: skin color, texture, turgor are normal, no rashes or significant lesions EYES: conjunctiva are pink and non-injected, sclera clear OROPHARYNX: no exudate, no erythema; lips, buccal mucosa, and tongue normal  NECK: supple, non-tender LUNGS: clear to auscultation with normal breathing effort HEART: regular rate & rhythm and no murmurs and no lower extremity edema ABDOMEN: soft, non-tender, non-distended, normal bowel sounds Musculoskeletal: no cyanosis of digits and no clubbing  PSYCH: alert & oriented x 3, fluent speech  LABORATORY DATA:  I have reviewed the data as listed    Component Value Date/Time   NA 139 02/12/2020 0854   K 4.3 02/12/2020 0854   CL 104 02/12/2020 0854   CO2 26 02/12/2020 0854   GLUCOSE 113 (H) 02/12/2020 0854   BUN 21 02/12/2020 0854   CREATININE 0.91 02/12/2020 0854   CALCIUM 9.2 02/12/2020 0854   PROT 6.1 (L) 02/12/2020 0854   ALBUMIN 4.2 02/12/2020 0854   AST 28 02/12/2020 0854   ALT 21 02/12/2020 0854   ALKPHOS 55 02/12/2020 0854   BILITOT 0.6 02/12/2020 0854   GFRNONAA >60 02/12/2020 0854   GFRAA >60 02/12/2020 0854    No results found for: SPEP, UPEP  Lab Results  Component Value Date   WBC 3.4 (L) 02/12/2020   NEUTROABS 2.2 02/12/2020   HGB 13.6 02/12/2020   HCT 39.5 02/12/2020   MCV 88.6 02/12/2020   PLT 124 (L) 02/12/2020      Chemistry      Component Value Date/Time   NA 139 02/12/2020 0854   K 4.3 02/12/2020 0854   CL 104 02/12/2020 0854   CO2 26 02/12/2020 0854   BUN 21 02/12/2020 0854   CREATININE 0.91 02/12/2020 0854      Component Value Date/Time    CALCIUM 9.2 02/12/2020 0854   ALKPHOS 55 02/12/2020 0854   AST 28 02/12/2020 0854   ALT 21 02/12/2020 0854   BILITOT 0.6 02/12/2020 0854       RADIOGRAPHIC STUDIES: I have personally reviewed the radiological images as listed below and agreed with the findings in the report. No results found.

## 2020-02-12 NOTE — Patient Instructions (Signed)
Implanted Port Insertion, Care After °This sheet gives you information about how to care for yourself after your procedure. Your health care provider may also give you more specific instructions. If you have problems or questions, contact your health care provider. °What can I expect after the procedure? °After the procedure, it is common to have: °· Discomfort at the port insertion site. °· Bruising on the skin over the port. This should improve over 3-4 days. °Follow these instructions at home: °Port care °· After your port is placed, you will get a manufacturer's information card. The card has information about your port. Keep this card with you at all times. °· Take care of the port as told by your health care provider. Ask your health care provider if you or a family member can get training for taking care of the port at home. A home health care nurse may also take care of the port. °· Make sure to remember what type of port you have. °Incision care ° °  ° °· Follow instructions from your health care provider about how to take care of your port insertion site. Make sure you: °? Wash your hands with soap and water before and after you change your bandage (dressing). If soap and water are not available, use hand sanitizer. °? Change your dressing as told by your health care provider. °? Leave stitches (sutures), skin glue, or adhesive strips in place. These skin closures may need to stay in place for 2 weeks or longer. If adhesive strip edges start to loosen and curl up, you may trim the loose edges. Do not remove adhesive strips completely unless your health care provider tells you to do that. °· Check your port insertion site every day for signs of infection. Check for: °? Redness, swelling, or pain. °? Fluid or blood. °? Warmth. °? Pus or a bad smell. °Activity °· Return to your normal activities as told by your health care provider. Ask your health care provider what activities are safe for you. °· Do not  lift anything that is heavier than 10 lb (4.5 kg), or the limit that you are told, until your health care provider says that it is safe. °General instructions °· Take over-the-counter and prescription medicines only as told by your health care provider. °· Do not take baths, swim, or use a hot tub until your health care provider approves. Ask your health care provider if you may take showers. You may only be allowed to take sponge baths. °· Do not drive for 24 hours if you were given a sedative during your procedure. °· Wear a medical alert bracelet in case of an emergency. This will tell any health care providers that you have a port. °· Keep all follow-up visits as told by your health care provider. This is important. °Contact a health care provider if: °· You cannot flush your port with saline as directed, or you cannot draw blood from the port. °· You have a fever or chills. °· You have redness, swelling, or pain around your port insertion site. °· You have fluid or blood coming from your port insertion site. °· Your port insertion site feels warm to the touch. °· You have pus or a bad smell coming from the port insertion site. °Get help right away if: °· You have chest pain or shortness of breath. °· You have bleeding from your port that you cannot control. °Summary °· Take care of the port as told by your health   care provider. Keep the manufacturer's information card with you at all times. °· Change your dressing as told by your health care provider. °· Contact a health care provider if you have a fever or chills or if you have redness, swelling, or pain around your port insertion site. °· Keep all follow-up visits as told by your health care provider. °This information is not intended to replace advice given to you by your health care provider. Make sure you discuss any questions you have with your health care provider. °Document Revised: 06/04/2018 Document Reviewed: 06/04/2018 °Elsevier Patient Education ©  2020 [employer] ° °

## 2020-02-19 ENCOUNTER — Other Ambulatory Visit: Payer: Self-pay | Admitting: Hematology

## 2020-02-19 ENCOUNTER — Inpatient Hospital Stay: Payer: 59 | Attending: Hematology

## 2020-02-19 ENCOUNTER — Other Ambulatory Visit: Payer: Self-pay

## 2020-02-19 ENCOUNTER — Inpatient Hospital Stay: Payer: 59

## 2020-02-19 VITALS — BP 122/76 | HR 99

## 2020-02-19 DIAGNOSIS — Z7189 Other specified counseling: Secondary | ICD-10-CM

## 2020-02-19 DIAGNOSIS — Z5112 Encounter for antineoplastic immunotherapy: Secondary | ICD-10-CM | POA: Diagnosis present

## 2020-02-19 DIAGNOSIS — C9000 Multiple myeloma not having achieved remission: Secondary | ICD-10-CM

## 2020-02-19 DIAGNOSIS — C9001 Multiple myeloma in remission: Secondary | ICD-10-CM

## 2020-02-19 LAB — CBC WITH DIFFERENTIAL (CANCER CENTER ONLY)
Abs Immature Granulocytes: 0.01 10*3/uL (ref 0.00–0.07)
Basophils Absolute: 0 10*3/uL (ref 0.0–0.1)
Basophils Relative: 0 %
Eosinophils Absolute: 0 10*3/uL (ref 0.0–0.5)
Eosinophils Relative: 1 %
HCT: 40.6 % (ref 39.0–52.0)
Hemoglobin: 14.3 g/dL (ref 13.0–17.0)
Immature Granulocytes: 0 %
Lymphocytes Relative: 18 %
Lymphs Abs: 0.7 10*3/uL (ref 0.7–4.0)
MCH: 31.2 pg (ref 26.0–34.0)
MCHC: 35.2 g/dL (ref 30.0–36.0)
MCV: 88.5 fL (ref 80.0–100.0)
Monocytes Absolute: 0.5 10*3/uL (ref 0.1–1.0)
Monocytes Relative: 14 %
Neutro Abs: 2.4 10*3/uL (ref 1.7–7.7)
Neutrophils Relative %: 67 %
Platelet Count: 123 10*3/uL — ABNORMAL LOW (ref 150–400)
RBC: 4.59 MIL/uL (ref 4.22–5.81)
RDW: 12.2 % (ref 11.5–15.5)
WBC Count: 3.6 10*3/uL — ABNORMAL LOW (ref 4.0–10.5)
nRBC: 0 % (ref 0.0–0.2)

## 2020-02-19 LAB — CMP (CANCER CENTER ONLY)
ALT: 21 U/L (ref 0–44)
AST: 26 U/L (ref 15–41)
Albumin: 4.1 g/dL (ref 3.5–5.0)
Alkaline Phosphatase: 53 U/L (ref 38–126)
Anion gap: 8 (ref 5–15)
BUN: 20 mg/dL (ref 8–23)
CO2: 27 mmol/L (ref 22–32)
Calcium: 9.2 mg/dL (ref 8.9–10.3)
Chloride: 107 mmol/L (ref 98–111)
Creatinine: 0.95 mg/dL (ref 0.61–1.24)
GFR, Est AFR Am: >60 mL/min (ref >60)
GFR, Estimated: >60 mL/min (ref >60)
Glucose, Bld: 115 mg/dL — ABNORMAL HIGH (ref 70–99)
Potassium: 4 mmol/L (ref 3.5–5.1)
Sodium: 142 mmol/L (ref 135–145)
Total Bilirubin: 0.6 mg/dL (ref 0.3–1.2)
Total Protein: 6 g/dL — ABNORMAL LOW (ref 6.5–8.1)

## 2020-02-19 MED ORDER — DARATUMUMAB-HYALURONIDASE-FIHJ 1800-30000 MG-UT/15ML ~~LOC~~ SOLN
1800.0000 mg | Freq: Once | SUBCUTANEOUS | Status: AC
  Administered 2020-02-19: 1800 mg via SUBCUTANEOUS
  Filled 2020-02-19: qty 15

## 2020-02-19 MED ORDER — DIPHENHYDRAMINE HCL 25 MG PO CAPS
ORAL_CAPSULE | ORAL | Status: AC
  Filled 2020-02-19: qty 2

## 2020-02-19 MED ORDER — MONTELUKAST SODIUM 10 MG PO TABS
10.0000 mg | ORAL_TABLET | Freq: Once | ORAL | Status: AC
  Administered 2020-02-19: 10 mg via ORAL
  Filled 2020-02-19: qty 1

## 2020-02-19 MED ORDER — ACETAMINOPHEN 325 MG PO TABS
650.0000 mg | ORAL_TABLET | Freq: Once | ORAL | Status: AC
  Administered 2020-02-19: 10:00:00 650 mg via ORAL

## 2020-02-19 MED ORDER — ACETAMINOPHEN 325 MG PO TABS
ORAL_TABLET | ORAL | Status: AC
  Filled 2020-02-19: qty 2

## 2020-02-19 MED ORDER — DEXAMETHASONE 4 MG PO TABS
20.0000 mg | ORAL_TABLET | Freq: Once | ORAL | Status: AC
  Administered 2020-02-19: 20 mg via ORAL

## 2020-02-19 MED ORDER — DIPHENHYDRAMINE HCL 25 MG PO CAPS
50.0000 mg | ORAL_CAPSULE | Freq: Once | ORAL | Status: AC
  Administered 2020-02-19: 50 mg via ORAL

## 2020-02-19 NOTE — Patient Instructions (Signed)

## 2020-02-19 NOTE — Patient Instructions (Signed)
Daratumumab injection What is this medicine? DARATUMUMAB (dar a toom ue mab) is a monoclonal antibody. It is used to treat multiple myeloma. This medicine may be used for other purposes; ask your health care provider or pharmacist if you have questions. COMMON BRAND NAME(S): DARZALEX What should I tell my health care provider before I take this medicine? They need to know if you have any of these conditions:  infection (especially a virus infection such as chickenpox, herpes, or hepatitis B virus)  lung or breathing disease  an unusual or allergic reaction to daratumumab, other medicines, foods, dyes, or preservatives  pregnant or trying to get pregnant  breast-feeding How should I use this medicine? This medicine is for infusion into a vein. It is given by a health care professional in a hospital or clinic setting. Talk to your pediatrician regarding the use of this medicine in children. Special care may be needed. Overdosage: If you think you have taken too much of this medicine contact a poison control center or emergency room at once. NOTE: This medicine is only for you. Do not share this medicine with others. What if I miss a dose? Keep appointments for follow-up doses as directed. It is important not to miss your dose. Call your doctor or health care professional if you are unable to keep an appointment. What may interact with this medicine? Interactions have not been studied. This list may not describe all possible interactions. Give your health care provider a list of all the medicines, herbs, non-prescription drugs, or dietary supplements you use. Also tell them if you smoke, drink alcohol, or use illegal drugs. Some items may interact with your medicine. What should I watch for while using this medicine? This drug may make you feel generally unwell. Report any side effects. Continue your course of treatment even though you feel ill unless your doctor tells you to stop. This  medicine can cause serious allergic reactions. To reduce your risk you may need to take medicine before treatment with this medicine. Take your medicine as directed. This medicine can affect the results of blood tests to match your blood type. These changes can last for up to 6 months after the final dose. Your healthcare provider will do blood tests to match your blood type before you start treatment. Tell all of your healthcare providers that you are being treated with this medicine before receiving a blood transfusion. This medicine can affect the results of some tests used to determine treatment response; extra tests may be needed to evaluate response. Do not become pregnant while taking this medicine or for 3 months after stopping it. Women should inform their doctor if they wish to become pregnant or think they might be pregnant. There is a potential for serious side effects to an unborn child. Talk to your health care professional or pharmacist for more information. What side effects may I notice from receiving this medicine? Side effects that you should report to your doctor or health care professional as soon as possible:  allergic reactions like skin rash, itching or hives, swelling of the face, lips, or tongue  breathing problems  chills  cough  dizziness  feeling faint or lightheaded  headache  low blood counts - this medicine may decrease the number of white blood cells, red blood cells and platelets. You may be at increased risk for infections and bleeding.  nausea, vomiting  shortness of breath  signs of decreased platelets or bleeding - bruising, pinpoint red spots on  the skin, black, tarry stools, blood in the urine  signs of decreased red blood cells - unusually weak or tired, feeling faint or lightheaded, falls  signs of infection - fever or chills, cough, sore throat, pain or difficulty passing urine  signs and symptoms of liver injury like dark yellow or brown  urine; general ill feeling or flu-like symptoms; light-colored stools; loss of appetite; right upper belly pain; unusually weak or tired; yellowing of the eyes or skin Side effects that usually do not require medical attention (report to your doctor or health care professional if they continue or are bothersome):  back pain  constipation  diarrhea  joint pain  muscle cramps  pain, tingling, numbness in the hands or feet  swelling of the ankles, feet, hands  tiredness  trouble sleeping This list may not describe all possible side effects. Call your doctor for medical advice about side effects. You may report side effects to FDA at 1-800-FDA-1088. Where should I keep my medicine? This drug is given in a hospital or clinic and will not be stored at home. NOTE: This sheet is a summary. It may not cover all possible information. If you have questions about this medicine, talk to your doctor, pharmacist, or health care provider.  2020 Elsevier/Gold Standard (2019-07-15 18:10:54)  

## 2020-02-19 NOTE — Progress Notes (Signed)
Post [place] vital signs WNL.  Patient observed x 2 hours.  Patient denies complaints

## 2020-02-20 ENCOUNTER — Encounter: Payer: Self-pay | Admitting: Cardiology

## 2020-02-20 ENCOUNTER — Ambulatory Visit: Payer: 59 | Admitting: Cardiology

## 2020-02-20 VITALS — BP 98/72 | HR 94 | Ht 68.0 in | Wt 169.8 lb

## 2020-02-20 DIAGNOSIS — I4892 Unspecified atrial flutter: Secondary | ICD-10-CM

## 2020-02-20 DIAGNOSIS — R079 Chest pain, unspecified: Secondary | ICD-10-CM

## 2020-02-20 LAB — KAPPA/LAMBDA LIGHT CHAINS
Kappa free light chain: 8.5 mg/L (ref 3.3–19.4)
Kappa, lambda light chain ratio: 1.25 (ref 0.26–1.65)
Lambda free light chains: 6.8 mg/L (ref 5.7–26.3)

## 2020-02-20 MED ORDER — APIXABAN 5 MG PO TABS: 5.0000 mg | ORAL_TABLET | Freq: Two times a day (BID) | ORAL | 6 refills | Status: DC

## 2020-02-20 MED ORDER — METOPROLOL TARTRATE 25 MG PO TABS: 25.0000 mg | ORAL_TABLET | Freq: Two times a day (BID) | ORAL | 6 refills | Status: DC

## 2020-02-20 NOTE — Progress Notes (Signed)
Cardiology Office Note:    Date:  02/20/2020   ID:  Danny Juarez, DOB [birth year], MRN [id number]  PCP:  Patient, No Pcp Per  Cardiologist:  Donato Heinz, MD  Electrophysiologist:  None   Referring MD: [name], MD   Chief Complaint  Patient presents with  . Chest Pain    History of Present Illness:    Danny Juarez is a 64 y.o. male with a hx of multiple myeloma status post bone marrow transplant at [facility] 09/2019, pulmonary embolism who is referred by Dr. Tyrone Apple for evaluation of chest pain.  Reports that since January he has been having right-sided chest pain.  Describes as dull aching pain, primarily occurs in the morning when he is walking his dog.  Has only noted the chest pain when exercising, though states that sometimes he is able to do significant exertion without any chest pain.  Past Medical History:  Diagnosis Date  . Anemia    due to chemo   . Cancer ([place])    Multiple myeloma with  Bone Mets  . Leukopenia    chemo related  . PE (pulmonary embolism) 1996    "3 in right lung"  . 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 HUMERUS IM NAIL Left 04/08/2019   Procedure: INTRAMEDULLARY (IM) NAIL HUMERUS;  Surgeon: Nicholes Stairs, MD;  Location: [facility];  Service: Orthopedics;  Laterality: Left;  120 mins  . IR IMAGING GUIDED PORT INSERTION  07/25/2019  . KNEE ARTHROSCOPY Left     Current Medications: Current Meds  Medication Sig  . acyclovir (ZOVIRAX) 800 MG tablet Take 800 mg by mouth 2 (two) times daily.  Marland Kitchen CALCIUM-VITAMIN D PO Take 1 tablet by mouth 2 (two) times a day.  . ferrous sulfate 325 (65 FE) MG tablet Take 325 mg by mouth daily with breakfast.  . folic acid (FOLVITE) 1 MG tablet Take by mouth daily.   . Multiple Vitamin (MULTIVITAMIN WITH MINERALS) TABS Take 1 tablet by mouth daily.  . pomalidomide (POMALYST) 2 MG capsule Take 1 capsule (2 mg total) by mouth daily for 21 days.  [place] # [id number]  . sulfamethoxazole-trimethoprim (BACTRIM) 400-80 MG tablet Take 1 tablet by mouth 3 (three) times a week. Monday Wednesday and Friday     Allergies:   Patient has no known allergies.   Social History   Socioeconomic History  . Marital status: Divorced    Spouse name: Not on file  . Number of children: 1  . Years of education: Not on file  . Highest education level: Not on file  Occupational History  . Not on file  Tobacco Use  . Smoking status: Never Smoker  . Smokeless tobacco: Never Used  Substance and Sexual Activity  . Alcohol use: Yes    Comment: occassional  . Drug use: No  . Sexual activity: Not on file  Other Topics Concern  . Not on file  Social History Narrative   Patient is divorced with one daughter that lives in [place], [place].   Patient has never smoked nor used smokeless tobacco.   Patient with occasional use of alcohol.   Patient denies use of illicit drugs.   Patient is a retired Set designer.   Social Determinants of Health   Financial Resource Strain:   . Difficulty of Paying Living Expenses:   Food Insecurity:   . Worried About Running  Out of Food in the Last Year:   . [place] in the Last Year:   Transportation Needs:   . Lack of Transportation (Medical):   Marland Kitchen Lack of Transportation (Non-Medical):   Physical Activity:   . Days of Exercise per Week:   . Minutes of Exercise per Session:   Stress:   . Feeling of Stress :   Social Connections:   . Frequency of Communication with Friends and Family:   . Frequency of Social Gatherings with Friends and Family:   . Attends Religious Services:   . Active Member of Clubs or Organizations:   . Attends Archivist Meetings:   Marland Kitchen Marital Status:      Family History: The patient's family history is not on file.  ROS:   Please see the history of present illness.     All other systems reviewed and are negative.  EKGs/Labs/Other Studies  Reviewed:    The following studies were reviewed today:   EKG:  EKG is ordered today.  The ekg ordered today demonstrates atrial flutter with 3-1 conduction, rate 94  Recent Labs: 09/09/2019: TSH 0.683 01/27/2020: Magnesium 2.1 02/19/2020: ALT 21; BUN 20; Creatinine 0.95; Hemoglobin 14.3; Platelet Count 123; Potassium 4.0; Sodium 142  Recent Lipid Panel No results found for: CHOL, TRIG, HDL, CHOLHDL, VLDL, LDLCALC, LDLDIRECT  Physical Exam:    VS:  BP 98/72   Pulse 94   Ht '5\' 8"'  (1.727 m)   Wt 169 lb 12.8 oz (77 kg)   SpO2 96%   BMI 25.82 kg/m     Wt Readings from Last 3 Encounters:  02/20/20 169 lb 12.8 oz (77 kg)  02/12/20 169 lb (76.7 kg)  01/27/20 169 lb 1.3 oz (76.7 kg)     GEN:   in no acute distress HEENT: Normal NECK: No JVD LYMPHATICS: No lymphadenopathy CARDIAC: RRR, no murmurs, rubs, gallops RESPIRATORY:  Clear to auscultation without rales, wheezing or rhonchi  ABDOMEN: Soft, non-tender, non-distended MUSCULOSKELETAL:  No edema; No deformity  SKIN: Warm and dry NEUROLOGIC:  Alert and oriented x 3 PSYCHIATRIC:  Normal affect   ASSESSMENT:    1. New onset atrial flutter (HCC)   2. Chest pain of uncertain etiology    PLAN:     Atrial flutter: New diagnosis, EKG today shows atrial flutter with 3:1 conduction, rate 94.  CHA2DS2-VASc score 0.  Appears symptomatic. -TTE -Start metoprolol 25 mg twice daily for rate control -Start Eliquis 5 mg twice daily for anticoagulation.  He has a history of multiple myeloma status post bone marrow transplant in November, his counts are currently stable.  OK to anticoagulate per Dr Maylon Peppers.  Given his CHA2DS2-VASc score is 0, will not need long-term anticoagulation.  Will plan to anticoagulate for 3 weeks, and if still in atrial flutter, proceed to cardioversion.  He will then need to complete 4 weeks of uninterrupted anticoagulation post cardioversion.  At that point, Eliquis could be discontinued  Chest pain: Atypical in  description, as describes right-sided chest pain, though does occur with exertion.  May be related to atrial flutter as above.  We will plan to treat atrial flutter, and if continues to have chest pain once back in sinus rhythm, will plan further ischemia evaluation  RTC in 3 weeks   Medication Adjustments/Labs and Tests Ordered: Current medicines are reviewed at length with the patient today.  Concerns regarding medicines are outlined above.  Orders Placed This Encounter  Procedures  . EKG 12-Lead  .  ECHOCARDIOGRAM COMPLETE   Meds ordered this encounter  Medications  . metoprolol tartrate (LOPRESSOR) 25 MG tablet    Sig: Take 1 tablet (25 mg total) by mouth 2 (two) times daily.    Dispense:  60 tablet    Refill:  6  . apixaban (ELIQUIS) 5 MG TABS tablet    Sig: Take 1 tablet (5 mg total) by mouth 2 (two) times daily.    Dispense:  60 tablet    Refill:  6    Patient Instructions  Medication Instructions:   Start Metoprolol tartrate 25 mg  One tablet twice a day    okay'd by Dr Maylon Peppers-- start Eliquis 5 mg one tablet twice a day   *If you need a refill on your cardiac medications before your next appointment, please call your pharmacy*   Lab Work: Not needed   Testing/Procedures:  will be schedule at [facility] has requested that you have an echocardiogram. Echocardiography is a painless test that uses sound waves to create images of your heart. It provides your doctor with information about the size and shape of your heart and how well your heart's chambers and valves are working. This procedure takes approximately one hour. There are no restrictions for this procedure.     Follow-Up: At [facility], you and your health needs are our priority.  As part of our continuing mission to provide you with exceptional heart care, we have created designated Provider Care Teams.  These Care Teams include your primary Cardiologist (physician)  and Advanced Practice Providers (APPs -  Physician Assistants and Nurse Practitioners) who all work together to provide you with the care you need, when you need it.  We recommend signing up for the patient portal called "MyChart".  Sign up information is provided on this After Visit Summary.  MyChart is used to connect with patients for Virtual Visits (Telemedicine).  Patients are able to view lab/test results, encounter notes, upcoming appointments, etc.  Non-urgent messages can be sent to your provider as well.   To learn more about what you can do with MyChart, go to [email or web address].    Your next appointment:   3 week(s)  The format for your next appointment:   In Person  Provider:   Oswaldo Milian, MD   Other Instructions     Signed, Donato Heinz, MD  02/20/2020 10:26 PM    [facility]

## 2020-02-20 NOTE — Patient Instructions (Addendum)
Medication Instructions:   Start Metoprolol tartrate 25 mg  One tablet twice a day    okay'd by Dr Maylon Peppers-- start Eliquis 5 mg one tablet twice a day   *If you need a refill on your cardiac medications before your next appointment, please call your pharmacy*   Lab Work: Not needed   Testing/Procedures:  will be schedule at [facility] has requested that you have an echocardiogram. Echocardiography is a painless test that uses sound waves to create images of your heart. It provides your doctor with information about the size and shape of your heart and how well your heart's chambers and valves are working. This procedure takes approximately one hour. There are no restrictions for this procedure.     Follow-Up: At [facility], you and your health needs are our priority.  As part of our continuing mission to provide you with exceptional heart care, we have created designated Provider Care Teams.  These Care Teams include your primary Cardiologist (physician) and Advanced Practice Providers (APPs -  Physician Assistants and Nurse Practitioners) who all work together to provide you with the care you need, when you need it.  We recommend signing up for the patient portal called "MyChart".  Sign up information is provided on this After Visit Summary.  MyChart is used to connect with patients for Virtual Visits (Telemedicine).  Patients are able to view lab/test results, encounter notes, upcoming appointments, etc.  Non-urgent messages can be sent to your provider as well.   To learn more about what you can do with MyChart, go to [email or web address].    Your next appointment:   3 week(s)  The format for your next appointment:   In Person  Provider:   Oswaldo Milian, MD   Other Instructions

## 2020-02-23 ENCOUNTER — Telehealth: Payer: Self-pay | Admitting: *Deleted

## 2020-02-23 LAB — MULTIPLE MYELOMA PANEL, SERUM
Albumin SerPl Elph-Mcnc: 3.8 g/dL (ref 2.9–4.4)
Albumin/Glob SerPl: 1.6 (ref 0.7–1.7)
Alpha 1: 0.3 g/dL (ref 0.0–0.4)
Alpha2 Glob SerPl Elph-Mcnc: 0.7 g/dL (ref 0.4–1.0)
B-Globulin SerPl Elph-Mcnc: 0.9 g/dL (ref 0.7–1.3)
Gamma Glob SerPl Elph-Mcnc: 0.4 g/dL (ref 0.4–1.8)
Globulin, Total: 2.4 g/dL (ref 2.2–3.9)
IgA: 8 mg/dL — ABNORMAL LOW (ref 61–437)
IgG (Immunoglobin G), Serum: 460 mg/dL — ABNORMAL LOW (ref 603–1613)
IgM (Immunoglobulin M), Srm: 15 mg/dL — ABNORMAL LOW (ref 20–172)
M Protein SerPl Elph-Mcnc: 0.2 g/dL — ABNORMAL HIGH
Total Protein ELP: 6.2 g/dL (ref 6.0–8.5)

## 2020-02-23 NOTE — Telephone Encounter (Signed)
I called patient regarding Pomalyst shipment. Patient stated,"no one has called me to set up shipment and delivery." I called [facility] and they have been trying to call him on his home number and they've left a message. I told patient that he needs to check his messages. He verbalized understanding.

## 2020-02-26 ENCOUNTER — Inpatient Hospital Stay: Payer: 59

## 2020-02-26 ENCOUNTER — Other Ambulatory Visit: Payer: Self-pay

## 2020-02-26 ENCOUNTER — Telehealth: Payer: Self-pay | Admitting: *Deleted

## 2020-02-26 DIAGNOSIS — Z7189 Other specified counseling: Secondary | ICD-10-CM

## 2020-02-26 DIAGNOSIS — C9000 Multiple myeloma not having achieved remission: Secondary | ICD-10-CM

## 2020-02-26 DIAGNOSIS — Z5112 Encounter for antineoplastic immunotherapy: Secondary | ICD-10-CM | POA: Diagnosis not present

## 2020-02-26 DIAGNOSIS — C9001 Multiple myeloma in remission: Secondary | ICD-10-CM

## 2020-02-26 LAB — CBC WITH DIFFERENTIAL (CANCER CENTER ONLY)
Abs Immature Granulocytes: 0.01 10*3/uL (ref 0.00–0.07)
Basophils Absolute: 0 10*3/uL (ref 0.0–0.1)
Basophils Relative: 1 %
Eosinophils Absolute: 0 10*3/uL (ref 0.0–0.5)
Eosinophils Relative: 1 %
HCT: 40.2 % (ref 39.0–52.0)
Hemoglobin: 13.9 g/dL (ref 13.0–17.0)
Immature Granulocytes: 0 %
Lymphocytes Relative: 17 %
Lymphs Abs: 0.6 10*3/uL — ABNORMAL LOW (ref 0.7–4.0)
MCH: 30.8 pg (ref 26.0–34.0)
MCHC: 34.6 g/dL (ref 30.0–36.0)
MCV: 89.1 fL (ref 80.0–100.0)
Monocytes Absolute: 0.5 10*3/uL (ref 0.1–1.0)
Monocytes Relative: 15 %
Neutro Abs: 2.2 10*3/uL (ref 1.7–7.7)
Neutrophils Relative %: 66 %
Platelet Count: 116 10*3/uL — ABNORMAL LOW (ref 150–400)
RBC: 4.51 MIL/uL (ref 4.22–5.81)
RDW: 12.3 % (ref 11.5–15.5)
WBC Count: 3.3 10*3/uL — ABNORMAL LOW (ref 4.0–10.5)
nRBC: 0 % (ref 0.0–0.2)

## 2020-02-26 LAB — CMP (CANCER CENTER ONLY)
ALT: 28 U/L (ref 0–44)
AST: 26 U/L (ref 15–41)
Albumin: 4.2 g/dL (ref 3.5–5.0)
Alkaline Phosphatase: 59 U/L (ref 38–126)
Anion gap: 8 (ref 5–15)
BUN: 30 mg/dL — ABNORMAL HIGH (ref 8–23)
CO2: 27 mmol/L (ref 22–32)
Calcium: 9.3 mg/dL (ref 8.9–10.3)
Chloride: 105 mmol/L (ref 98–111)
Creatinine: 0.94 mg/dL (ref 0.61–1.24)
GFR, Est AFR Am: >60 mL/min (ref >60)
GFR, Estimated: >60 mL/min (ref >60)
Glucose, Bld: 115 mg/dL — ABNORMAL HIGH (ref 70–99)
Potassium: 4.1 mmol/L (ref 3.5–5.1)
Sodium: 140 mmol/L (ref 135–145)
Total Bilirubin: 0.5 mg/dL (ref 0.3–1.2)
Total Protein: 5.9 g/dL — ABNORMAL LOW (ref 6.5–8.1)

## 2020-02-26 MED ORDER — SODIUM CHLORIDE 0.9% FLUSH
10.0000 mL | Freq: Once | INTRAVENOUS | Status: AC | PRN
  Administered 2020-02-26: 10 mL
  Filled 2020-02-26: qty 10

## 2020-02-26 MED ORDER — HEPARIN SOD (PORK) LOCK FLUSH 100 UNIT/ML IV SOLN
500.0000 [IU] | Freq: Once | INTRAVENOUS | Status: AC | PRN
  Administered 2020-02-26: 10:00:00 500 [IU]
  Filled 2020-02-26: qty 5

## 2020-02-26 NOTE — Telephone Encounter (Signed)
-----   Message from [name], MD sent at 02/26/2020  9:03 AM EDT ----- Delrae Sawyers, Can you let Bronsyn know that his labs are adequate, and he can start Pomalyst today?Thanks.  [place]  ----- Message ----- From: Buel Ream, Lab In [place] Sent: 02/26/2020   8:53 AM EDT To: [name], MD

## 2020-02-26 NOTE — Telephone Encounter (Signed)
As noted below by Dr. Maylon Peppers, I informed the patient while he was here at the [facility] that he should go ahead and start Pomalyst today. He verbalized understanding.

## 2020-02-27 LAB — UPEP/UIFE/LIGHT CHAINS/TP, 24-HR UR
% BETA, Urine: 32.6 %
ALPHA 1 URINE: 7.9 %
Albumin, U: 44.2 %
Alpha 2, Urine: 9.4 %
Free Kappa Lt Chains,Ur: 5.94 mg/L (ref 0.63–113.79)
Free Kappa/Lambda Ratio: >8.49 (ref 1.03–31.76)
Free Lambda Lt Chains,Ur: <0.7 mg/L (ref 0.47–11.77)
GAMMA GLOBULIN URINE: 5.9 %
Total Protein, Urine-Ur/day: 65 mg/24 hr (ref 30–150)
Total Protein, Urine: 8.7 mg/dL
Total Volume: 700

## 2020-03-02 ENCOUNTER — Other Ambulatory Visit: Payer: Self-pay | Admitting: Hematology

## 2020-03-03 ENCOUNTER — Ambulatory Visit (HOSPITAL_COMMUNITY): Payer: 59 | Attending: Cardiovascular Disease

## 2020-03-03 ENCOUNTER — Other Ambulatory Visit: Payer: Self-pay

## 2020-03-03 DIAGNOSIS — I4892 Unspecified atrial flutter: Secondary | ICD-10-CM | POA: Diagnosis not present

## 2020-03-04 ENCOUNTER — Inpatient Hospital Stay: Payer: 59

## 2020-03-04 DIAGNOSIS — Z5112 Encounter for antineoplastic immunotherapy: Secondary | ICD-10-CM | POA: Diagnosis not present

## 2020-03-04 DIAGNOSIS — Z7189 Other specified counseling: Secondary | ICD-10-CM

## 2020-03-04 DIAGNOSIS — C9000 Multiple myeloma not having achieved remission: Secondary | ICD-10-CM

## 2020-03-04 LAB — CBC WITH DIFFERENTIAL (CANCER CENTER ONLY)
Abs Immature Granulocytes: 0.01 10*3/uL (ref 0.00–0.07)
Basophils Absolute: 0 10*3/uL (ref 0.0–0.1)
Basophils Relative: 1 %
Eosinophils Absolute: 0.1 10*3/uL (ref 0.0–0.5)
Eosinophils Relative: 2 %
HCT: 37 % — ABNORMAL LOW (ref 39.0–52.0)
Hemoglobin: 12.9 g/dL — ABNORMAL LOW (ref 13.0–17.0)
Immature Granulocytes: 0 %
Lymphocytes Relative: 10 %
Lymphs Abs: 0.3 10*3/uL — ABNORMAL LOW (ref 0.7–4.0)
MCH: 30.9 pg (ref 26.0–34.0)
MCHC: 34.9 g/dL (ref 30.0–36.0)
MCV: 88.7 fL (ref 80.0–100.0)
Monocytes Absolute: 0.4 10*3/uL (ref 0.1–1.0)
Monocytes Relative: 13 %
Neutro Abs: 2.4 10*3/uL (ref 1.7–7.7)
Neutrophils Relative %: 74 %
Platelet Count: 91 10*3/uL — ABNORMAL LOW (ref 150–400)
RBC: 4.17 MIL/uL — ABNORMAL LOW (ref 4.22–5.81)
RDW: 12.7 % (ref 11.5–15.5)
WBC Count: 3.3 10*3/uL — ABNORMAL LOW (ref 4.0–10.5)
nRBC: 0 % (ref 0.0–0.2)

## 2020-03-04 LAB — CMP (CANCER CENTER ONLY)
ALT: 25 U/L (ref 0–44)
AST: 24 U/L (ref 15–41)
Albumin: 4 g/dL (ref 3.5–5.0)
Alkaline Phosphatase: 52 U/L (ref 38–126)
Anion gap: 8 (ref 5–15)
BUN: 19 mg/dL (ref 8–23)
CO2: 27 mmol/L (ref 22–32)
Calcium: 9.2 mg/dL (ref 8.9–10.3)
Chloride: 105 mmol/L (ref 98–111)
Creatinine: 0.91 mg/dL (ref 0.61–1.24)
GFR, Est AFR Am: >60 mL/min (ref >60)
GFR, Estimated: >60 mL/min (ref >60)
Glucose, Bld: 103 mg/dL — ABNORMAL HIGH (ref 70–99)
Potassium: 4.2 mmol/L (ref 3.5–5.1)
Sodium: 140 mmol/L (ref 135–145)
Total Bilirubin: 0.6 mg/dL (ref 0.3–1.2)
Total Protein: 5.5 g/dL — ABNORMAL LOW (ref 6.5–8.1)

## 2020-03-04 NOTE — Patient Instructions (Signed)

## 2020-03-11 ENCOUNTER — Inpatient Hospital Stay: Payer: 59

## 2020-03-11 ENCOUNTER — Other Ambulatory Visit: Payer: Self-pay | Admitting: *Deleted

## 2020-03-11 ENCOUNTER — Other Ambulatory Visit: Payer: Self-pay

## 2020-03-11 ENCOUNTER — Other Ambulatory Visit: Payer: Self-pay | Admitting: Hematology

## 2020-03-11 DIAGNOSIS — C9000 Multiple myeloma not having achieved remission: Secondary | ICD-10-CM

## 2020-03-11 DIAGNOSIS — C9001 Multiple myeloma in remission: Secondary | ICD-10-CM

## 2020-03-11 DIAGNOSIS — Z5112 Encounter for antineoplastic immunotherapy: Secondary | ICD-10-CM | POA: Diagnosis not present

## 2020-03-11 DIAGNOSIS — Z7189 Other specified counseling: Secondary | ICD-10-CM

## 2020-03-11 LAB — CMP (CANCER CENTER ONLY)
ALT: 20 U/L (ref 0–44)
AST: 20 U/L (ref 15–41)
Albumin: 4 g/dL (ref 3.5–5.0)
Alkaline Phosphatase: 61 U/L (ref 38–126)
Anion gap: 7 (ref 5–15)
BUN: 22 mg/dL (ref 8–23)
CO2: 26 mmol/L (ref 22–32)
Calcium: 9.1 mg/dL (ref 8.9–10.3)
Chloride: 105 mmol/L (ref 98–111)
Creatinine: 0.92 mg/dL (ref 0.61–1.24)
GFR, Est AFR Am: >60 mL/min (ref >60)
GFR, Estimated: >60 mL/min (ref >60)
Glucose, Bld: 111 mg/dL — ABNORMAL HIGH (ref 70–99)
Potassium: 4.3 mmol/L (ref 3.5–5.1)
Sodium: 138 mmol/L (ref 135–145)
Total Bilirubin: 0.5 mg/dL (ref 0.3–1.2)
Total Protein: 5.8 g/dL — ABNORMAL LOW (ref 6.5–8.1)

## 2020-03-11 LAB — CBC WITH DIFFERENTIAL (CANCER CENTER ONLY)
Abs Immature Granulocytes: 0.01 10*3/uL (ref 0.00–0.07)
Basophils Absolute: 0 10*3/uL (ref 0.0–0.1)
Basophils Relative: 1 %
Eosinophils Absolute: 0.1 10*3/uL (ref 0.0–0.5)
Eosinophils Relative: 4 %
HCT: 37.4 % — ABNORMAL LOW (ref 39.0–52.0)
Hemoglobin: 13.1 g/dL (ref 13.0–17.0)
Immature Granulocytes: 1 %
Lymphocytes Relative: 17 %
Lymphs Abs: 0.4 10*3/uL — ABNORMAL LOW (ref 0.7–4.0)
MCH: 30.8 pg (ref 26.0–34.0)
MCHC: 35 g/dL (ref 30.0–36.0)
MCV: 88 fL (ref 80.0–100.0)
Monocytes Absolute: 0.7 10*3/uL (ref 0.1–1.0)
Monocytes Relative: 32 %
Neutro Abs: 1 10*3/uL — ABNORMAL LOW (ref 1.7–7.7)
Neutrophils Relative %: 45 %
Platelet Count: 73 10*3/uL — ABNORMAL LOW (ref 150–400)
RBC: 4.25 MIL/uL (ref 4.22–5.81)
RDW: 12.5 % (ref 11.5–15.5)
WBC Count: 2.1 10*3/uL — ABNORMAL LOW (ref 4.0–10.5)
nRBC: 0 % (ref 0.0–0.2)

## 2020-03-11 MED ORDER — POMALIDOMIDE 2 MG PO CAPS: 2.0000 mg | ORAL_CAPSULE | Freq: Every day | ORAL | 0 refills | Status: DC

## 2020-03-11 MED ORDER — POMALIDOMIDE 2 MG PO CAPS: ORAL_CAPSULE | ORAL | 0 refills | Status: DC

## 2020-03-11 NOTE — Patient Instructions (Signed)

## 2020-03-12 NOTE — Progress Notes (Signed)
Pharmacist Chemotherapy Monitoring - Follow Up Assessment    I verify that I have reviewed each item in the below checklist:  . Regimen for the patient is scheduled for the appropriate day and plan matches scheduled date. Marland Kitchen Appropriate non-routine labs are ordered dependent on drug ordered. . If applicable, additional medications reviewed and ordered per protocol based on lifetime cumulative doses and/or treatment regimen.   Plan for follow-up and/or issues identified: No . I-vent associated with next due treatment: No . MD and/or nursing notified: No  Ares Cardozo, Jacqlyn Larsen 03/12/2020 8:19 AM

## 2020-03-14 NOTE — H&P (View-Only) (Signed)
Cardiology Office Note:    Date:  03/15/2020   ID:  Danny Juarez, DOB 02-Jul-1956, MRN [id number]  PCP:  Patient, No Pcp Per  Cardiologist:  Donato Heinz, MD  Electrophysiologist:  None   Referring MD: No ref. provider found   No chief complaint on file.   History of Present Illness:    Danny Juarez is a 64 y.o. male with a hx of multiple myeloma status post bone marrow transplant at [facility] 09/2019, pulmonary embolism who presents for follow-up.  He was referred by Dr. Tyrone Apple for evaluation of chest pain, initially seen on 02/22/2020.  Reports that since January he has been having right-sided chest pain.  Describes as dull aching pain, primarily occurs in the morning when he is walking his dog.  Has only noted the chest pain when exercising, though states that sometimes he is able to do significant exertion without any chest pain.  At initial clinic visit on 02/22/2020, EKG showed atrial flutter with 3-1 conduction, rate 94.  He was started on metoprolol 25 mg daily for rate control in Eliquis 5 mg twice daily.  Discussed anticoagulation with Dr. Maylon Peppers.  Plan was to anticoagulate for 3 weeks, and if still in atrial flutter proceed to cardioversion.  TTE on 03/03/2020 showed LVEF 35 to 40%, diffuse hypokinesis worse in inferior wall with abnormal septal motion, normal RV systolic function, mild MR, IVC small/collapsible.  Since last clinic visit, he reports his chest pain has improved.  Denies any dyspnea or lower extremity edema.  Does report some lightheadedness.  He did not start taking Eliquis until 4/15.  He denies any bleeding issues.    Past Medical History:  Diagnosis Date  . Anemia    due to chemo   . Cancer ([place])    Multiple myeloma with  Bone Mets  . Leukopenia    chemo related  . PE (pulmonary embolism) 1996    "3 in right lung"  . 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 HUMERUS IM NAIL Left  04/08/2019   Procedure: INTRAMEDULLARY (IM) NAIL HUMERUS;  Surgeon: Nicholes Stairs, MD;  Location: [facility];  Service: Orthopedics;  Laterality: Left;  120 mins  . IR IMAGING GUIDED PORT INSERTION  07/25/2019  . KNEE ARTHROSCOPY Left     Current Medications: Current Meds  Medication Sig  . acyclovir (ZOVIRAX) 800 MG tablet Take 800 mg by mouth 2 (two) times daily.  Marland Kitchen apixaban (ELIQUIS) 5 MG TABS tablet Take 1 tablet (5 mg total) by mouth 2 (two) times daily.  Marland Kitchen CALCIUM-VITAMIN D PO Take 1 tablet by mouth 2 (two) times a day.  . ferrous sulfate 325 (65 FE) MG tablet Take 325 mg by mouth daily with breakfast.  . folic acid (FOLVITE) 1 MG tablet Take by mouth daily.   . Multiple Vitamin (MULTIVITAMIN WITH MINERALS) TABS Take 1 tablet by mouth daily.  . pomalidomide (POMALYST) 2 MG capsule Take one capsule by mouth daily for three weeks on, one week off. [place] # [id number].  . sulfamethoxazole-trimethoprim (BACTRIM) 400-80 MG tablet Take 1 tablet by mouth 3 (three) times a week. Monday Wednesday and Friday  . [DISCONTINUED] metoprolol tartrate (LOPRESSOR) 25 MG tablet Take 1 tablet (25 mg total) by mouth 2 (two) times daily.     Allergies:   Patient has no known allergies.   Social History   Socioeconomic History  . Marital status: Divorced  Spouse name: Not on file  . Number of children: 1  . Years of education: Not on file  . Highest education level: Not on file  Occupational History  . Not on file  Tobacco Use  . Smoking status: Never Smoker  . Smokeless tobacco: Never Used  Substance and Sexual Activity  . Alcohol use: Yes    Comment: occassional  . Drug use: No  . Sexual activity: Not on file  Other Topics Concern  . Not on file  Social History Narrative   Patient is divorced with one daughter that lives in [place], [place].   Patient has never smoked nor used smokeless tobacco.   Patient with occasional use of alcohol.   Patient denies use of illicit drugs.    Patient is a retired Set designer.   Social Determinants of Health   Financial Resource Strain:   . Difficulty of Paying Living Expenses:   Food Insecurity:   . Worried About Charity fundraiser in the Last Year:   . Arboriculturist in the Last Year:   Transportation Needs:   . Film/video editor (Medical):   Marland Kitchen Lack of Transportation (Non-Medical):   Physical Activity:   . Days of Exercise per Week:   . Minutes of Exercise per Session:   Stress:   . Feeling of Stress :   Social Connections:   . Frequency of Communication with Friends and Family:   . Frequency of Social Gatherings with Friends and Family:   . Attends Religious Services:   . Active Member of Clubs or Organizations:   . Attends Archivist Meetings:   Marland Kitchen Marital Status:      Family History: The patient's family history is not on file.  ROS:   Please see the history of present illness.     All other systems reviewed and are negative.  EKGs/Labs/Other Studies Reviewed:    The following studies were reviewed today:   EKG:  EKG is ordered today.  The ekg ordered 03/15/20 demonstrates atrial flutter with variable conduction, rate 64  TTE 03/03/20: 1. Diffuse hypokinesis worse in the inferior wall with abnormal septal  motion Patient in atrial flutter throughout the study . Left ventricular  ejection fraction, by estimation, is 35 to 40%. The left ventricle has  moderately decreased function. The  left ventricle demonstrates global hypokinesis. The left ventricular  internal cavity size was mildly dilated. Left ventricular diastolic  parameters are indeterminate.  2. Right ventricular systolic function is normal. The right ventricular  size is normal. There is normal pulmonary artery systolic pressure.  3. Left atrial size was mildly dilated.  4. The mitral valve is normal in structure. Mild mitral valve  regurgitation. No evidence of mitral stenosis.  5. The aortic  valve is tricuspid. Aortic valve regurgitation is not  visualized. Mild aortic valve sclerosis is present, with no evidence of  aortic valve stenosis.  6. The inferior vena cava is normal in size with greater than 50%  respiratory variability, suggesting right atrial pressure of 3 mmHg.   Recent Labs: 09/09/2019: TSH 0.683 01/27/2020: Magnesium 2.1 03/11/2020: ALT 20; BUN 22; Creatinine 0.92; Hemoglobin 13.1; Platelet Count 73; Potassium 4.3; Sodium 138  Recent Lipid Panel No results found for: CHOL, TRIG, HDL, CHOLHDL, VLDL, LDLCALC, LDLDIRECT  Physical Exam:    VS:  BP 112/74   Pulse 64   Ht _0  (1.727 m)   Wt 167 lb 3.2 oz (75.8 kg)  BMI 25.42 kg/m     Wt Readings from Last 3 Encounters:  03/15/20 167 lb 3.2 oz (75.8 kg)  02/20/20 169 lb 12.8 oz (77 kg)  02/12/20 169 lb (76.7 kg)     GEN:   in no acute distress HEENT: Normal NECK: No JVD CARDIAC: RRR, no murmurs, rubs, gallops RESPIRATORY:  Clear to auscultation without rales, wheezing or rhonchi  ABDOMEN: Soft, non-tender, non-distended MUSCULOSKELETAL:  No edema; No deformity  SKIN: Warm and dry NEUROLOGIC:  Alert and oriented x 3 PSYCHIATRIC:  Normal affect   ASSESSMENT:    1. New onset atrial flutter (HCC)   2. Chest pain of uncertain etiology   3. Systolic dysfunction    PLAN:     Atrial flutter: New diagnosis. CHA2DS2-VASc score 1 given new diagnosis of systolic heart failure.  Appears symptomatic.  TTE 03/03/20 showed LVEF 35 to 40%, diffuse hypokinesis worse in inferior wall with abnormal septal motion, normal RV systolic function, mild MR, IVC small/collapsible. -Plan for DCCV once he has completed 3 weeks of anticoagulation (after 03/25/2020). -Switch from Lopressor to Toprol-XL 25 mg daily -Continue Eliquis 5 mg twice daily for anticoagulation.  He has a history of multiple myeloma status post bone marrow transplant in November, his counts are currently stable.  OK to anticoagulate per Dr Maylon Peppers.  Given  his CHA2DS2-VASc score is 1, may not need long-term anticoagulation. He will need to complete 4 weeks of uninterrupted anticoagulation post cardioversion.  At that point, Eliquis could be discontinued if needed  Chest pain: Atypical in description, as describes right-sided chest pain, though does occur with exertion.  May be related to atrial flutter as above.  We will plan to treat atrial flutter, and if continues to have chest pain once back in sinus rhythm, will plan further ischemia evaluation  Systolic dysfunction: EF 35 to 40%.  Appears euvolemic.  May be tachycardia induced due to atrial flutter.  We will plan reevaluation of systolic function after normal sinus rhythm is restored.  If EF remains low, will plan for ischemia evaluation.  RTC in 1 month   Medication Adjustments/Labs and Tests Ordered: Current medicines are reviewed at length with the patient today.  Concerns regarding medicines are outlined above.  Orders Placed This Encounter  Procedures  . CBC  . Basic metabolic panel  . EKG 12-Lead   Meds ordered this encounter  Medications  . metoprolol succinate (TOPROL XL) 25 MG 24 hr tablet    Sig: Take 1 tablet (25 mg total) by mouth daily.    Dispense:  30 tablet    Refill:  3    Patient Instructions   Please arrive at the [facility] (Main Entrance A) at [facility]: [place], [place] [address] (1 hour prior to procedure unless lab work is needed; if lab work is needed arrive 1.5 hours ahead)  ----I will call you with the date and time tomorrow  DIET: Nothing to eat or drink after midnight except a sip of water with medications (see medication instructions below)  Medication Instructions:  Continue your anticoagulant: Eliquis  You will need to continue your anticoagulant after your procedure until you  are told by your  Provider that it is safe to stop   Labs: (BMET, CBC within 1 week of procedure)  Covid test 3 days prior-I will schedule  this once cardioversion is scheduled.   You must have a responsible person to drive you home and stay in the waiting area  during your procedure. Failure to do so could result in cancellation.  Bring your insurance cards.  *Special Note: Every effort is made to have your procedure done on time. Occasionally there are emergencies that occur at the hospital that may cause delays. Please be patient if a delay does occur.      STOP metoprolol tartrate (Lopressor) START metoprolol succinate (Toprol XL) 25 mg daily       Signed, Donato Heinz, MD  03/15/2020 5:49 PM    [place]

## 2020-03-14 NOTE — Progress Notes (Signed)
Cardiology Office Note:    Date:  03/15/2020   ID:  Danny Juarez, DOB 02-Jul-1956, MRN [id number]  PCP:  Patient, No Pcp Per  Cardiologist:  Donato Heinz, MD  Electrophysiologist:  None   Referring MD: No ref. provider found   No chief complaint on file.   History of Present Illness:    Danny Juarez is a 64 y.o. male with a hx of multiple myeloma status post bone marrow transplant at [facility] 09/2019, pulmonary embolism who presents for follow-up.  He was referred by Dr. Tyrone Apple for evaluation of chest pain, initially seen on 02/22/2020.  Reports that since January he has been having right-sided chest pain.  Describes as dull aching pain, primarily occurs in the morning when he is walking his dog.  Has only noted the chest pain when exercising, though states that sometimes he is able to do significant exertion without any chest pain.  At initial clinic visit on 02/22/2020, EKG showed atrial flutter with 3-1 conduction, rate 94.  He was started on metoprolol 25 mg daily for rate control in Eliquis 5 mg twice daily.  Discussed anticoagulation with Dr. Maylon Peppers.  Plan was to anticoagulate for 3 weeks, and if still in atrial flutter proceed to cardioversion.  TTE on 03/03/2020 showed LVEF 35 to 40%, diffuse hypokinesis worse in inferior wall with abnormal septal motion, normal RV systolic function, mild MR, IVC small/collapsible.  Since last clinic visit, he reports his chest pain has improved.  Denies any dyspnea or lower extremity edema.  Does report some lightheadedness.  He did not start taking Eliquis until 4/15.  He denies any bleeding issues.    Past Medical History:  Diagnosis Date  . Anemia    due to chemo   . Cancer ([place])    Multiple myeloma with  Bone Mets  . Leukopenia    chemo related  . PE (pulmonary embolism) 1996    "3 in right lung"  . 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 HUMERUS IM NAIL Left  04/08/2019   Procedure: INTRAMEDULLARY (IM) NAIL HUMERUS;  Surgeon: Nicholes Stairs, MD;  Location: [facility];  Service: Orthopedics;  Laterality: Left;  120 mins  . IR IMAGING GUIDED PORT INSERTION  07/25/2019  . KNEE ARTHROSCOPY Left     Current Medications: Current Meds  Medication Sig  . acyclovir (ZOVIRAX) 800 MG tablet Take 800 mg by mouth 2 (two) times daily.  Marland Kitchen apixaban (ELIQUIS) 5 MG TABS tablet Take 1 tablet (5 mg total) by mouth 2 (two) times daily.  Marland Kitchen CALCIUM-VITAMIN D PO Take 1 tablet by mouth 2 (two) times a day.  . ferrous sulfate 325 (65 FE) MG tablet Take 325 mg by mouth daily with breakfast.  . folic acid (FOLVITE) 1 MG tablet Take by mouth daily.   . Multiple Vitamin (MULTIVITAMIN WITH MINERALS) TABS Take 1 tablet by mouth daily.  . pomalidomide (POMALYST) 2 MG capsule Take one capsule by mouth daily for three weeks on, one week off. [place] # [id number].  . sulfamethoxazole-trimethoprim (BACTRIM) 400-80 MG tablet Take 1 tablet by mouth 3 (three) times a week. Monday Wednesday and Friday  . [DISCONTINUED] metoprolol tartrate (LOPRESSOR) 25 MG tablet Take 1 tablet (25 mg total) by mouth 2 (two) times daily.     Allergies:   Patient has no known allergies.   Social History   Socioeconomic History  . Marital status: Divorced  Spouse name: Not on file  . Number of children: 1  . Years of education: Not on file  . Highest education level: Not on file  Occupational History  . Not on file  Tobacco Use  . Smoking status: Never Smoker  . Smokeless tobacco: Never Used  Substance and Sexual Activity  . Alcohol use: Yes    Comment: occassional  . Drug use: No  . Sexual activity: Not on file  Other Topics Concern  . Not on file  Social History Narrative   Patient is divorced with one daughter that lives in [place], [place].   Patient has never smoked nor used smokeless tobacco.   Patient with occasional use of alcohol.   Patient denies use of illicit drugs.    Patient is a retired Set designer.   Social Determinants of Health   Financial Resource Strain:   . Difficulty of Paying Living Expenses:   Food Insecurity:   . Worried About Charity fundraiser in the Last Year:   . Arboriculturist in the Last Year:   Transportation Needs:   . Film/video editor (Medical):   Marland Kitchen Lack of Transportation (Non-Medical):   Physical Activity:   . Days of Exercise per Week:   . Minutes of Exercise per Session:   Stress:   . Feeling of Stress :   Social Connections:   . Frequency of Communication with Friends and Family:   . Frequency of Social Gatherings with Friends and Family:   . Attends Religious Services:   . Active Member of Clubs or Organizations:   . Attends Archivist Meetings:   Marland Kitchen Marital Status:      Family History: The patient's family history is not on file.  ROS:   Please see the history of present illness.     All other systems reviewed and are negative.  EKGs/Labs/Other Studies Reviewed:    The following studies were reviewed today:   EKG:  EKG is ordered today.  The ekg ordered 03/15/20 demonstrates atrial flutter with variable conduction, rate 64  TTE 03/03/20: 1. Diffuse hypokinesis worse in the inferior wall with abnormal septal  motion Patient in atrial flutter throughout the study . Left ventricular  ejection fraction, by estimation, is 35 to 40%. The left ventricle has  moderately decreased function. The  left ventricle demonstrates global hypokinesis. The left ventricular  internal cavity size was mildly dilated. Left ventricular diastolic  parameters are indeterminate.  2. Right ventricular systolic function is normal. The right ventricular  size is normal. There is normal pulmonary artery systolic pressure.  3. Left atrial size was mildly dilated.  4. The mitral valve is normal in structure. Mild mitral valve  regurgitation. No evidence of mitral stenosis.  5. The aortic  valve is tricuspid. Aortic valve regurgitation is not  visualized. Mild aortic valve sclerosis is present, with no evidence of  aortic valve stenosis.  6. The inferior vena cava is normal in size with greater than 50%  respiratory variability, suggesting right atrial pressure of 3 mmHg.   Recent Labs: 09/09/2019: TSH 0.683 01/27/2020: Magnesium 2.1 03/11/2020: ALT 20; BUN 22; Creatinine 0.92; Hemoglobin 13.1; Platelet Count 73; Potassium 4.3; Sodium 138  Recent Lipid Panel No results found for: CHOL, TRIG, HDL, CHOLHDL, VLDL, LDLCALC, LDLDIRECT  Physical Exam:    VS:  BP 112/74   Pulse 64   Ht _0  (1.727 m)   Wt 167 lb 3.2 oz (75.8 kg)  BMI 25.42 kg/m     Wt Readings from Last 3 Encounters:  03/15/20 167 lb 3.2 oz (75.8 kg)  02/20/20 169 lb 12.8 oz (77 kg)  02/12/20 169 lb (76.7 kg)     GEN:   in no acute distress HEENT: Normal NECK: No JVD CARDIAC: RRR, no murmurs, rubs, gallops RESPIRATORY:  Clear to auscultation without rales, wheezing or rhonchi  ABDOMEN: Soft, non-tender, non-distended MUSCULOSKELETAL:  No edema; No deformity  SKIN: Warm and dry NEUROLOGIC:  Alert and oriented x 3 PSYCHIATRIC:  Normal affect   ASSESSMENT:    1. New onset atrial flutter (HCC)   2. Chest pain of uncertain etiology   3. Systolic dysfunction    PLAN:     Atrial flutter: New diagnosis. CHA2DS2-VASc score 1 given new diagnosis of systolic heart failure.  Appears symptomatic.  TTE 03/03/20 showed LVEF 35 to 40%, diffuse hypokinesis worse in inferior wall with abnormal septal motion, normal RV systolic function, mild MR, IVC small/collapsible. -Plan for DCCV once he has completed 3 weeks of anticoagulation (after 03/25/2020). -Switch from Lopressor to Toprol-XL 25 mg daily -Continue Eliquis 5 mg twice daily for anticoagulation.  He has a history of multiple myeloma status post bone marrow transplant in November, his counts are currently stable.  OK to anticoagulate per Dr Maylon Peppers.  Given  his CHA2DS2-VASc score is 1, may not need long-term anticoagulation. He will need to complete 4 weeks of uninterrupted anticoagulation post cardioversion.  At that point, Eliquis could be discontinued if needed  Chest pain: Atypical in description, as describes right-sided chest pain, though does occur with exertion.  May be related to atrial flutter as above.  We will plan to treat atrial flutter, and if continues to have chest pain once back in sinus rhythm, will plan further ischemia evaluation  Systolic dysfunction: EF 35 to 40%.  Appears euvolemic.  May be tachycardia induced due to atrial flutter.  We will plan reevaluation of systolic function after normal sinus rhythm is restored.  If EF remains low, will plan for ischemia evaluation.  RTC in 1 month   Medication Adjustments/Labs and Tests Ordered: Current medicines are reviewed at length with the patient today.  Concerns regarding medicines are outlined above.  Orders Placed This Encounter  Procedures  . CBC  . Basic metabolic panel  . EKG 12-Lead   Meds ordered this encounter  Medications  . metoprolol succinate (TOPROL XL) 25 MG 24 hr tablet    Sig: Take 1 tablet (25 mg total) by mouth daily.    Dispense:  30 tablet    Refill:  3    Patient Instructions   Please arrive at the [facility] (Main Entrance A) at [facility]: [place], [place] [address] (1 hour prior to procedure unless lab work is needed; if lab work is needed arrive 1.5 hours ahead)  ----I will call you with the date and time tomorrow  DIET: Nothing to eat or drink after midnight except a sip of water with medications (see medication instructions below)  Medication Instructions:  Continue your anticoagulant: Eliquis  You will need to continue your anticoagulant after your procedure until you  are told by your  Provider that it is safe to stop   Labs: (BMET, CBC within 1 week of procedure)  Covid test 3 days prior-I will schedule  this once cardioversion is scheduled.   You must have a responsible person to drive you home and stay in the waiting area  during your procedure. Failure to do so could result in cancellation.  Bring your insurance cards.  *Special Note: Every effort is made to have your procedure done on time. Occasionally there are emergencies that occur at the hospital that may cause delays. Please be patient if a delay does occur.      STOP metoprolol tartrate (Lopressor) START metoprolol succinate (Toprol XL) 25 mg daily       Signed, Donato Heinz, MD  03/15/2020 5:49 PM    [place]

## 2020-03-15 ENCOUNTER — Encounter: Payer: Self-pay | Admitting: Cardiology

## 2020-03-15 ENCOUNTER — Ambulatory Visit: Payer: 59 | Admitting: Cardiology

## 2020-03-15 ENCOUNTER — Other Ambulatory Visit: Payer: Self-pay

## 2020-03-15 VITALS — BP 112/74 | HR 64 | Ht 68.0 in | Wt 167.2 lb

## 2020-03-15 DIAGNOSIS — I4892 Unspecified atrial flutter: Secondary | ICD-10-CM | POA: Diagnosis not present

## 2020-03-15 DIAGNOSIS — I519 Heart disease, unspecified: Secondary | ICD-10-CM | POA: Diagnosis not present

## 2020-03-15 DIAGNOSIS — R079 Chest pain, unspecified: Secondary | ICD-10-CM | POA: Diagnosis not present

## 2020-03-15 MED ORDER — METOPROLOL SUCCINATE ER 25 MG PO TB24: 25.0000 mg | ORAL_TABLET | Freq: Every day | ORAL | 3 refills | Status: DC

## 2020-03-15 NOTE — Patient Instructions (Addendum)
Please arrive at the [facility] (Main Entrance A) at [facility]: [place], [place] [address] (1 hour prior to procedure unless lab work is needed; if lab work is needed arrive 1.5 hours ahead)  ----I will call you with the date and time tomorrow  DIET: Nothing to eat or drink after midnight except a sip of water with medications (see medication instructions below)  Medication Instructions:  Continue your anticoagulant: Eliquis  You will need to continue your anticoagulant after your procedure until you  are told by your  Provider that it is safe to stop   Labs: (BMET, CBC within 1 week of procedure)  Covid test 3 days prior-I will schedule this once cardioversion is scheduled.   You must have a responsible person to drive you home and stay in the waiting area during your procedure. Failure to do so could result in cancellation.  Bring your insurance cards.  *Special Note: Every effort is made to have your procedure done on time. Occasionally there are emergencies that occur at the hospital that may cause delays. Please be patient if a delay does occur.      STOP metoprolol tartrate (Lopressor) START metoprolol succinate (Toprol XL) 25 mg daily

## 2020-03-16 ENCOUNTER — Telehealth: Payer: Self-pay | Admitting: *Deleted

## 2020-03-16 ENCOUNTER — Other Ambulatory Visit: Payer: Self-pay | Admitting: *Deleted

## 2020-03-16 NOTE — Telephone Encounter (Signed)
Left message to call back  

## 2020-03-16 NOTE — Telephone Encounter (Signed)
Patient returning [facility] call.

## 2020-03-16 NOTE — Telephone Encounter (Addendum)
Cardioversion scheduled for Monday 5/10 with Dr. Acie Fredrickson (per Dr. Gardiner Rhyme OV 4/26).  Covid test scheduled Thursday 5/6 at 2:45 pm. Labs 5/6 as well.     Instructions were provided and reviewed in office 4/26 (see AVS)

## 2020-03-16 NOTE — Telephone Encounter (Signed)
Spoke to patient, aware of cardioversion scheduled on 5/10 (arrive at [facility])  Covid and labs to be completed 5/6  Patient verbalized understanding.  Advised to call with questions or concerns.

## 2020-03-19 ENCOUNTER — Other Ambulatory Visit: Payer: Self-pay | Admitting: *Deleted

## 2020-03-19 ENCOUNTER — Other Ambulatory Visit: Payer: Self-pay

## 2020-03-19 ENCOUNTER — Other Ambulatory Visit: Payer: Self-pay | Admitting: Hematology

## 2020-03-19 ENCOUNTER — Inpatient Hospital Stay: Payer: 59

## 2020-03-19 VITALS — BP 103/58 | HR 95 | Temp 97.8°F | Resp 18

## 2020-03-19 DIAGNOSIS — C9001 Multiple myeloma in remission: Secondary | ICD-10-CM

## 2020-03-19 DIAGNOSIS — Z7189 Other specified counseling: Secondary | ICD-10-CM

## 2020-03-19 DIAGNOSIS — C9000 Multiple myeloma not having achieved remission: Secondary | ICD-10-CM

## 2020-03-19 DIAGNOSIS — Z5112 Encounter for antineoplastic immunotherapy: Secondary | ICD-10-CM | POA: Diagnosis not present

## 2020-03-19 LAB — CBC WITH DIFFERENTIAL (CANCER CENTER ONLY)
Abs Immature Granulocytes: 0 10*3/uL (ref 0.00–0.07)
Basophils Absolute: 0 10*3/uL (ref 0.0–0.1)
Basophils Relative: 2 %
Eosinophils Absolute: 0.1 10*3/uL (ref 0.0–0.5)
Eosinophils Relative: 2 %
HCT: 35.3 % — ABNORMAL LOW (ref 39.0–52.0)
Hemoglobin: 12.3 g/dL — ABNORMAL LOW (ref 13.0–17.0)
Immature Granulocytes: 0 %
Lymphocytes Relative: 24 %
Lymphs Abs: 0.6 10*3/uL — ABNORMAL LOW (ref 0.7–4.0)
MCH: 30.7 pg (ref 26.0–34.0)
MCHC: 34.8 g/dL (ref 30.0–36.0)
MCV: 88 fL (ref 80.0–100.0)
Monocytes Absolute: 0.7 10*3/uL (ref 0.1–1.0)
Monocytes Relative: 30 %
Neutro Abs: 1 10*3/uL — ABNORMAL LOW (ref 1.7–7.7)
Neutrophils Relative %: 42 %
Platelet Count: 93 10*3/uL — ABNORMAL LOW (ref 150–400)
RBC: 4.01 MIL/uL — ABNORMAL LOW (ref 4.22–5.81)
RDW: 13 % (ref 11.5–15.5)
WBC Count: 2.3 10*3/uL — ABNORMAL LOW (ref 4.0–10.5)
nRBC: 0 % (ref 0.0–0.2)

## 2020-03-19 LAB — CMP (CANCER CENTER ONLY)
ALT: 19 U/L (ref 0–44)
AST: 20 U/L (ref 15–41)
Albumin: 4 g/dL (ref 3.5–5.0)
Alkaline Phosphatase: 49 U/L (ref 38–126)
Anion gap: 6 (ref 5–15)
BUN: 28 mg/dL — ABNORMAL HIGH (ref 8–23)
CO2: 28 mmol/L (ref 22–32)
Calcium: 9.4 mg/dL (ref 8.9–10.3)
Chloride: 106 mmol/L (ref 98–111)
Creatinine: 0.83 mg/dL (ref 0.61–1.24)
GFR, Est AFR Am: >60 mL/min (ref >60)
GFR, Estimated: >60 mL/min (ref >60)
Glucose, Bld: 116 mg/dL — ABNORMAL HIGH (ref 70–99)
Potassium: 3.9 mmol/L (ref 3.5–5.1)
Sodium: 140 mmol/L (ref 135–145)
Total Bilirubin: 0.7 mg/dL (ref 0.3–1.2)
Total Protein: 5.8 g/dL — ABNORMAL LOW (ref 6.5–8.1)

## 2020-03-19 MED ORDER — ACETAMINOPHEN 325 MG PO TABS
ORAL_TABLET | ORAL | Status: AC
  Filled 2020-03-19: qty 2

## 2020-03-19 MED ORDER — SODIUM CHLORIDE 0.9 % IV SOLN
Freq: Once | INTRAVENOUS | Status: AC
  Filled 2020-03-19: qty 250

## 2020-03-19 MED ORDER — HEPARIN SOD (PORK) LOCK FLUSH 100 UNIT/ML IV SOLN
500.0000 [IU] | Freq: Once | INTRAVENOUS | Status: AC | PRN
  Administered 2020-03-19: 500 [IU]
  Filled 2020-03-19: qty 5

## 2020-03-19 MED ORDER — ACETAMINOPHEN 325 MG PO TABS
650.0000 mg | ORAL_TABLET | Freq: Once | ORAL | Status: AC
  Administered 2020-03-19: 650 mg via ORAL

## 2020-03-19 MED ORDER — POMALIDOMIDE 1 MG PO CAPS: 1.0000 mg | ORAL_CAPSULE | Freq: Every day | ORAL | 0 refills | Status: DC

## 2020-03-19 MED ORDER — SODIUM CHLORIDE 0.9% FLUSH
10.0000 mL | INTRAVENOUS | Status: DC | PRN
  Administered 2020-03-19: 10 mL
  Filled 2020-03-19: qty 10

## 2020-03-19 MED ORDER — ZOLEDRONIC ACID 4 MG/100ML IV SOLN
4.0000 mg | Freq: Once | INTRAVENOUS | Status: AC
  Administered 2020-03-19: 4 mg via INTRAVENOUS
  Filled 2020-03-19: qty 100

## 2020-03-19 MED ORDER — DIPHENHYDRAMINE HCL 25 MG PO CAPS
50.0000 mg | ORAL_CAPSULE | Freq: Once | ORAL | Status: AC
  Administered 2020-03-19: 12:00:00 50 mg via ORAL

## 2020-03-19 MED ORDER — DEXAMETHASONE 4 MG PO TABS
ORAL_TABLET | ORAL | Status: AC
  Filled 2020-03-19: qty 5

## 2020-03-19 MED ORDER — DIPHENHYDRAMINE HCL 25 MG PO CAPS
ORAL_CAPSULE | ORAL | Status: AC
Start: 2020-03-19 — End: ?
  Filled 2020-03-19: qty 2

## 2020-03-19 MED ORDER — DARATUMUMAB-HYALURONIDASE-FIHJ 1800-30000 MG-UT/15ML ~~LOC~~ SOLN
1800.0000 mg | Freq: Once | SUBCUTANEOUS | Status: AC
  Administered 2020-03-19: 1800 mg via SUBCUTANEOUS
  Filled 2020-03-19: qty 15

## 2020-03-19 MED ORDER — DEXAMETHASONE 4 MG PO TABS
20.0000 mg | ORAL_TABLET | Freq: Once | ORAL | Status: AC
  Administered 2020-03-19: 20 mg via ORAL

## 2020-03-19 MED ORDER — MONTELUKAST SODIUM 10 MG PO TABS
10.0000 mg | ORAL_TABLET | Freq: Once | ORAL | Status: AC
  Administered 2020-03-19: 10 mg via ORAL
  Filled 2020-03-19: qty 1

## 2020-03-19 NOTE — Progress Notes (Signed)
Labs reviewed with MD ok to treat despite counts

## 2020-03-19 NOTE — Patient Instructions (Signed)
Daratumumab injection What is this medicine? DARATUMUMAB (dar a toom ue mab) is a monoclonal antibody. It is used to treat multiple myeloma. This medicine may be used for other purposes; ask your health care provider or pharmacist if you have questions. COMMON BRAND NAME(S): DARZALEX What should I tell my health care provider before I take this medicine? They need to know if you have any of these conditions:  infection (especially a virus infection such as chickenpox, herpes, or hepatitis B virus)  lung or breathing disease  an unusual or allergic reaction to daratumumab, other medicines, foods, dyes, or preservatives  pregnant or trying to get pregnant  breast-feeding How should I use this medicine? This medicine is for infusion into a vein. It is given by a health care professional in a hospital or clinic setting. Talk to your pediatrician regarding the use of this medicine in children. Special care may be needed. Overdosage: If you think you have taken too much of this medicine contact a poison control center or emergency room at once. NOTE: This medicine is only for you. Do not share this medicine with others. What if I miss a dose? Keep appointments for follow-up doses as directed. It is important not to miss your dose. Call your doctor or health care professional if you are unable to keep an appointment. What may interact with this medicine? Interactions have not been studied. This list may not describe all possible interactions. Give your health care provider a list of all the medicines, herbs, non-prescription drugs, or dietary supplements you use. Also tell them if you smoke, drink alcohol, or use illegal drugs. Some items may interact with your medicine. What should I watch for while using this medicine? This drug may make you feel generally unwell. Report any side effects. Continue your course of treatment even though you feel ill unless your doctor tells you to stop. This  medicine can cause serious allergic reactions. To reduce your risk you may need to take medicine before treatment with this medicine. Take your medicine as directed. This medicine can affect the results of blood tests to match your blood type. These changes can last for up to 6 months after the final dose. Your healthcare provider will do blood tests to match your blood type before you start treatment. Tell all of your healthcare providers that you are being treated with this medicine before receiving a blood transfusion. This medicine can affect the results of some tests used to determine treatment response; extra tests may be needed to evaluate response. Do not become pregnant while taking this medicine or for 3 months after stopping it. Women should inform their doctor if they wish to become pregnant or think they might be pregnant. There is a potential for serious side effects to an unborn child. Talk to your health care professional or pharmacist for more information. What side effects may I notice from receiving this medicine? Side effects that you should report to your doctor or health care professional as soon as possible:  allergic reactions like skin rash, itching or hives, swelling of the face, lips, or tongue  breathing problems  chills  cough  dizziness  feeling faint or lightheaded  headache  low blood counts - this medicine may decrease the number of white blood cells, red blood cells and platelets. You may be at increased risk for infections and bleeding.  nausea, vomiting  shortness of breath  signs of decreased platelets or bleeding - bruising, pinpoint red spots on  the skin, black, tarry stools, blood in the urine  signs of decreased red blood cells - unusually weak or tired, feeling faint or lightheaded, falls  signs of infection - fever or chills, cough, sore throat, pain or difficulty passing urine  signs and symptoms of liver injury like dark yellow or brown  urine; general ill feeling or flu-like symptoms; light-colored stools; loss of appetite; right upper belly pain; unusually weak or tired; yellowing of the eyes or skin Side effects that usually do not require medical attention (report to your doctor or health care professional if they continue or are bothersome):  back pain  constipation  diarrhea  joint pain  muscle cramps  pain, tingling, numbness in the hands or feet  swelling of the ankles, feet, hands  tiredness  trouble sleeping This list may not describe all possible side effects. Call your doctor for medical advice about side effects. You may report side effects to FDA at 1-800-FDA-1088. Where should I keep my medicine? This drug is given in a hospital or clinic and will not be stored at home. NOTE: This sheet is a summary. It may not cover all possible information. If you have questions about this medicine, talk to your doctor, pharmacist, or health care provider.  2020 Elsevier/Gold Standard (2019-07-15 18:10:54)  

## 2020-03-22 LAB — MULTIPLE MYELOMA PANEL, SERUM
Albumin SerPl Elph-Mcnc: 3.4 g/dL (ref 2.9–4.4)
Albumin/Glob SerPl: 1.7 (ref 0.7–1.7)
Alpha 1: 0.3 g/dL (ref 0.0–0.4)
Alpha2 Glob SerPl Elph-Mcnc: 0.6 g/dL (ref 0.4–1.0)
B-Globulin SerPl Elph-Mcnc: 0.8 g/dL (ref 0.7–1.3)
Gamma Glob SerPl Elph-Mcnc: 0.4 g/dL (ref 0.4–1.8)
Globulin, Total: 2.1 g/dL — ABNORMAL LOW (ref 2.2–3.9)
IgA: 10 mg/dL — ABNORMAL LOW (ref 61–437)
IgG (Immunoglobin G), Serum: 389 mg/dL — ABNORMAL LOW (ref 603–1613)
IgM (Immunoglobulin M), Srm: 27 mg/dL (ref 20–172)
M Protein SerPl Elph-Mcnc: 0.1 g/dL — ABNORMAL HIGH
Total Protein ELP: 5.5 g/dL — ABNORMAL LOW (ref 6.0–8.5)

## 2020-03-22 LAB — KAPPA/LAMBDA LIGHT CHAINS
Kappa free light chain: 15 mg/L (ref 3.3–19.4)
Kappa, lambda light chain ratio: 1.25 (ref 0.26–1.65)
Lambda free light chains: 12 mg/L (ref 5.7–26.3)

## 2020-03-25 ENCOUNTER — Other Ambulatory Visit (HOSPITAL_COMMUNITY)
Admission: RE | Admit: 2020-03-25 | Discharge: 2020-03-25 | Disposition: A | Discharge status: Home / Self Care | Payer: 59 | Source: Ambulatory Visit | Attending: Cardiovascular Disease | Admitting: Cardiovascular Disease

## 2020-03-25 DIAGNOSIS — Z20822 Contact with and (suspected) exposure to covid-19: Secondary | ICD-10-CM | POA: Insufficient documentation

## 2020-03-25 DIAGNOSIS — Z01812 Encounter for preprocedural laboratory examination: Secondary | ICD-10-CM | POA: Diagnosis present

## 2020-03-26 LAB — BASIC METABOLIC PANEL
BUN/Creatinine Ratio: 24 (ref 10–24)
BUN: 23 mg/dL (ref 8–27)
CO2: 25 mmol/L (ref 20–29)
Calcium: 9.7 mg/dL (ref 8.6–10.2)
Chloride: 102 mmol/L (ref 96–106)
Creatinine, Ser: 0.94 mg/dL (ref 0.76–1.27)
GFR calc Af Amer: 99 mL/min/{1.73_m2} (ref >59)
GFR calc non Af Amer: 85 mL/min/{1.73_m2} (ref >59)
Glucose: 88 mg/dL (ref 65–99)
Potassium: 4.9 mmol/L (ref 3.5–5.2)
Sodium: 139 mmol/L (ref 134–144)

## 2020-03-26 LAB — CBC
Hematocrit: 40.3 % (ref 37.5–51.0)
Hemoglobin: 13.9 g/dL (ref 13.0–17.7)
MCH: 31.1 pg (ref 26.6–33.0)
MCHC: 34.5 g/dL (ref 31.5–35.7)
MCV: 90 fL (ref 79–97)
Platelets: 176 10*3/uL (ref 150–450)
RBC: 4.47 x10E6/uL (ref 4.14–5.80)
RDW: 13.8 % (ref 11.6–15.4)
WBC: 3.1 10*3/uL — ABNORMAL LOW (ref 3.4–10.8)

## 2020-03-26 LAB — SARS CORONAVIRUS 2 (TAT 6-24 HRS): SARS Coronavirus 2: NEGATIVE

## 2020-03-29 ENCOUNTER — Encounter (HOSPITAL_COMMUNITY)
Admission: RE | Disposition: A | Discharge status: Home / Self Care | Payer: 59 | Source: Home / Self Care | Attending: Cardiovascular Disease

## 2020-03-29 ENCOUNTER — Encounter (HOSPITAL_COMMUNITY): Payer: Self-pay | Admitting: Cardiovascular Disease

## 2020-03-29 ENCOUNTER — Ambulatory Visit (HOSPITAL_COMMUNITY)
Admission: RE | Admit: 2020-03-29 | Discharge: 2020-03-29 | Disposition: A | Discharge status: Home / Self Care | Payer: 59 | Attending: Cardiovascular Disease | Admitting: Cardiovascular Disease

## 2020-03-29 ENCOUNTER — Ambulatory Visit (HOSPITAL_COMMUNITY): Payer: 59 | Admitting: Certified Registered"

## 2020-03-29 ENCOUNTER — Other Ambulatory Visit: Payer: Self-pay

## 2020-03-29 DIAGNOSIS — Z86711 Personal history of pulmonary embolism: Secondary | ICD-10-CM | POA: Diagnosis not present

## 2020-03-29 DIAGNOSIS — I4892 Unspecified atrial flutter: Secondary | ICD-10-CM

## 2020-03-29 DIAGNOSIS — I5020 Unspecified systolic (congestive) heart failure: Secondary | ICD-10-CM | POA: Insufficient documentation

## 2020-03-29 DIAGNOSIS — I484 Atypical atrial flutter: Secondary | ICD-10-CM

## 2020-03-29 DIAGNOSIS — Z79899 Other long term (current) drug therapy: Secondary | ICD-10-CM | POA: Diagnosis not present

## 2020-03-29 DIAGNOSIS — Z7901 Long term (current) use of anticoagulants: Secondary | ICD-10-CM | POA: Diagnosis not present

## 2020-03-29 DIAGNOSIS — Z9481 Bone marrow transplant status: Secondary | ICD-10-CM | POA: Diagnosis not present

## 2020-03-29 HISTORY — PX: CARDIOVERSION: SHX1299

## 2020-03-29 SURGERY — CARDIOVERSION
Anesthesia: General

## 2020-03-29 MED ORDER — LIDOCAINE HCL (CARDIAC) PF 100 MG/5ML IV SOSY
PREFILLED_SYRINGE | INTRAVENOUS | Status: DC | PRN
Start: 2020-03-29 — End: 2020-03-29
  Administered 2020-03-29: 60 mg via INTRATRACHEAL

## 2020-03-29 MED ORDER — PHENYLEPHRINE HCL (PRESSORS) 10 MG/ML IV SOLN
INTRAVENOUS | Status: DC | PRN
Start: 2020-03-29 — End: 2020-03-29
  Administered 2020-03-29: 120 ug via INTRAVENOUS

## 2020-03-29 MED ORDER — SODIUM CHLORIDE 0.9 % IV SOLN: INTRAVENOUS | Status: DC | PRN

## 2020-03-29 MED ORDER — PROPOFOL 10 MG/ML IV BOLUS
INTRAVENOUS | Status: DC | PRN
  Administered 2020-03-29: 50 mg via INTRAVENOUS
  Administered 2020-03-29: 20 mg via INTRAVENOUS

## 2020-03-29 NOTE — Transfer of Care (Signed)
Immediate Anesthesia Transfer of Care Note  Patient: Danny Juarez  Procedure(s) Performed: CARDIOVERSION (N/A )  Patient Location: Endoscopy Unit  Anesthesia Type:General  Level of Consciousness: awake, alert  and sedated  Airway & Oxygen Therapy: Patient connected to nasal cannula oxygen  Post-op Assessment: Post -op Vital signs reviewed and stable  Post vital signs: stable  Last Vitals:  Vitals Value Taken Time  BP    Temp    Pulse    Resp    SpO2      Last Pain:  Vitals:   03/29/20 0754  TempSrc: Oral  PainSc: 0-No pain         Complications: No apparent anesthesia complications

## 2020-03-29 NOTE — Anesthesia Preprocedure Evaluation (Addendum)
Anesthesia Evaluation  Patient identified by MRN, date of birth, ID band Patient awake    Reviewed: Allergy & Precautions, NPO status , Patient's Chart, lab work & pertinent test results, reviewed documented beta blocker date and time   Airway Mallampati: II  TM Distance: >3 FB Neck ROM: Full    Dental  (+) Dental Advisory Given, Missing   Pulmonary PE (1996)   Pulmonary exam normal breath sounds clear to auscultation       Cardiovascular hypertension, Pt. on home beta blockers + dysrhythmias Atrial Fibrillation  Rhythm:Irregular Rate:Abnormal  03/03/20 Echo: 1. Diffuse hypokinesis worse in the inferior wall with abnormal septal motion Patient in atrial flutter throughout the study . Left ventricular ejection fraction, by estimation, is 35 to 40%. The left ventricle has moderately decreased function. The left ventricle demonstrates global hypokinesis. The left ventricular internal cavity size was mildly dilated. Left ventricular diastolic parameters are indeterminate.  2. Right ventricular systolic function is normal. The right ventricular size is normal. There is normal pulmonary artery systolic pressure.  3. Left atrial size was mildly dilated.  4. The mitral valve is normal in structure. Mild mitral valve  regurgitation. No evidence of mitral stenosis.  5. The aortic valve is tricuspid. Aortic valve regurgitation is not visualized. Mild aortic valve sclerosis is present, with no evidence of aortic valve stenosis.  6. The inferior vena cava is normal in size with greater than 50% respiratory variability, suggesting right atrial pressure of 3 mmHg.   Neuro/Psych negative neurological ROS     GI/Hepatic negative GI ROS, Neg liver ROS,   Endo/Other  negative endocrine ROS  Renal/GU negative Renal ROS     Musculoskeletal negative musculoskeletal ROS (+)   Abdominal   Peds  Hematology  (+) Blood dyscrasia (Eliquis), ,  Multiple myeloma s/p chemotherapy   Anesthesia Other Findings Day of surgery medications reviewed with the patient.  Reproductive/Obstetrics                            Anesthesia Physical Anesthesia Plan  ASA: III  Anesthesia Plan: General   Post-op Pain Management:    Induction: Intravenous  PONV Risk Score and Plan: 2 and Propofol infusion and Treatment may vary due to age or medical condition  Airway Management Planned: Mask  Additional Equipment:   Intra-op Plan:   Post-operative Plan:   Informed Consent: I have reviewed the patients History and Physical, chart, labs and discussed the procedure including the risks, benefits and alternatives for the proposed anesthesia with the patient or authorized representative who has indicated his/her understanding and acceptance.     Dental advisory given  Plan Discussed with: CRNA  Anesthesia Plan Comments:        Anesthesia Quick Evaluation

## 2020-03-29 NOTE — CV Procedure (Signed)
    Cardioversion Note  Alfie Everts FG:[id number] 02-20-1956  Procedure: DC Cardioversion Indications: atrial flutter   Procedure Details Consent: Obtained Time Out: Verified patient identification, verified procedure, site/side was marked, verified correct patient position, special equipment/implants available, Radiology Safety Procedures followed,  medications/allergies/relevent history reviewed, required imaging and test results available.  Performed  The patient has been on adequate anticoagulation.  The patient received IV Lidocaine 60 mg followed by Propofol 70 mg IV  for sedation.  Synchronous cardioversion was performed at 50, 200  joules.  The cardioversion was successful     Complications: No apparent complications Patient did tolerate procedure well.   Thayer Headings, Brooke Bonito., MD, [facility] 03/29/2020, 9:03 AM

## 2020-03-29 NOTE — Anesthesia Postprocedure Evaluation (Signed)
Anesthesia Post Note  Patient: Danny Juarez  Procedure(s) Performed: CARDIOVERSION (N/A )     Patient location during evaluation: Endoscopy Anesthesia Type: General Level of consciousness: awake and alert Pain management: pain level controlled Vital Signs Assessment: post-procedure vital signs reviewed and stable Respiratory status: spontaneous breathing, nonlabored ventilation, respiratory function stable and patient connected to nasal cannula oxygen Cardiovascular status: blood pressure returned to baseline and stable Postop Assessment: no apparent nausea or vomiting Anesthetic complications: no    Last Vitals:  Vitals:   03/29/20 0906 03/29/20 0915  BP: 92/63 102/62  Pulse: 63 66  Resp: 17 18  Temp: 36.6 C   SpO2: 100% 100%    Last Pain:  Vitals:   03/29/20 0915  TempSrc:   PainSc: 0-No pain                 Catalina Gravel

## 2020-03-29 NOTE — Discharge Instructions (Signed)
Electrical Cardioversion Electrical cardioversion is the delivery of a jolt of electricity to restore a normal rhythm to the heart. A rhythm that is too fast or is not regular keeps the heart from pumping well. In this procedure, sticky patches or metal paddles are placed on the chest to deliver electricity to the heart from a device. This procedure may be done in an emergency if:  There is low or no blood pressure as a result of the heart rhythm.  Normal rhythm must be restored as fast as possible to protect the brain and heart from further damage.  It may save a life. This may also be a scheduled procedure for irregular or fast heart rhythms that are not immediately life-threatening. Tell a health care provider about:  Any allergies you have.  All medicines you are taking, including vitamins, herbs, eye drops, creams, and over-the-counter medicines.  Any problems you or family members have had with anesthetic medicines.  Any blood disorders you have.  Any surgeries you have had.  Any medical conditions you have.  Whether you are pregnant or may be pregnant. What are the risks? Generally, this is a safe procedure. However, problems may occur, including:  Allergic reactions to medicines.  A blood clot that breaks free and travels to other parts of your body.  The possible return of an abnormal heart rhythm within hours or days after the procedure.  Your heart stopping (cardiac arrest). This is rare. What happens before the procedure? Medicines  Your health care provider may have you start taking: ? Blood-thinning medicines (anticoagulants) so your blood does not clot as easily. ? Medicines to help stabilize your heart rate and rhythm.  Ask your health care provider about: ? Changing or stopping your regular medicines. This is especially important if you are taking diabetes medicines or blood thinners. ? Taking medicines such as aspirin and ibuprofen. These medicines can  thin your blood. Do not take these medicines unless your health care provider tells you to take them. ? Taking over-the-counter medicines, vitamins, herbs, and supplements. General instructions  Follow instructions from your health care provider about eating or drinking restrictions.  Plan to have someone take you home from the hospital or clinic.  If you will be going home right after the procedure, plan to have someone with you for 24 hours.  Ask your health care provider what steps will be taken to help prevent infection. These may include washing your skin with a germ-killing soap. What happens during the procedure?   An IV will be inserted into one of your veins.  Sticky patches (electrodes) or metal paddles may be placed on your chest.  You will be given a medicine to help you relax (sedative).  An electrical shock will be delivered. The procedure may vary among health care providers and hospitals. What can I expect after the procedure?  Your blood pressure, heart rate, breathing rate, and blood oxygen level will be monitored until you leave the hospital or clinic.  Your heart rhythm will be watched to make sure it does not change.  You may have some redness on the skin where the shocks were given. Follow these instructions at home:  Do not drive for 24 hours if you were given a sedative during your procedure.  Take over-the-counter and prescription medicines only as told by your health care provider.  Ask your health care provider how to check your pulse. Check it often.  Rest for 48 hours after the procedure or   as told by your health care provider.  Avoid or limit your caffeine use as told by your health care provider.  Keep all follow-up visits as told by your health care provider. This is important. Contact a health care provider if:  You feel like your heart is beating too quickly or your pulse is not regular.  You have a serious muscle cramp that does not go  away. Get help right away if:  You have discomfort in your chest.  You are dizzy or you feel faint.  You have trouble breathing or you are short of breath.  Your speech is slurred.  You have trouble moving an arm or leg on one side of your body.  Your fingers or toes turn cold or blue. Summary  Electrical cardioversion is the delivery of a jolt of electricity to restore a normal rhythm to the heart.  This procedure may be done right away in an emergency or may be a scheduled procedure if the condition is not an emergency.  Generally, this is a safe procedure.  After the procedure, check your pulse often as told by your health care provider. This information is not intended to replace advice given to you by your health care provider. Make sure you discuss any questions you have with your health care provider. Document Revised: 06/09/2019 Document Reviewed: 06/09/2019 Elsevier Patient Education  2020 [employer]  

## 2020-03-29 NOTE — Anesthesia Procedure Notes (Signed)
Procedure Name: General with mask airway Date/Time: 03/29/2020 8:49 AM Performed by: Lavell Luster, CRNA Pre-anesthesia Checklist: Patient identified, Emergency Drugs available, Suction available, Patient being monitored and Timeout performed Patient Re-evaluated:Patient Re-evaluated prior to induction Oxygen Delivery Method: Ambu bag Preoxygenation: Pre-oxygenation with 100% oxygen Induction Type: IV induction Placement Confirmation: breath sounds checked- equal and bilateral

## 2020-03-29 NOTE — Interval H&P Note (Signed)
History and Physical Interval Note:  03/29/2020 8:45 AM  Danny Juarez  has presented today for surgery, with the diagnosis of A-FLUTTER.  The various methods of treatment have been discussed with the patient and family. After consideration of risks, benefits and other options for treatment, the patient has consented to  Procedure(s): CARDIOVERSION (N/A) as a surgical intervention.  The patient's history has been reviewed, patient examined, no change in status, stable for surgery.  I have reviewed the patient's chart and labs.  Questions were answered to the patient's satisfaction.     Mertie Moores

## 2020-04-09 ENCOUNTER — Other Ambulatory Visit: Payer: Self-pay

## 2020-04-09 ENCOUNTER — Encounter: Payer: Self-pay | Admitting: Cardiology

## 2020-04-09 ENCOUNTER — Ambulatory Visit: Payer: 59 | Admitting: Cardiology

## 2020-04-09 VITALS — BP 118/70 | HR 71 | Temp 97.2°F | Ht 68.0 in | Wt 160.6 lb

## 2020-04-09 DIAGNOSIS — R079 Chest pain, unspecified: Secondary | ICD-10-CM | POA: Diagnosis not present

## 2020-04-09 DIAGNOSIS — I5042 Chronic combined systolic (congestive) and diastolic (congestive) heart failure: Secondary | ICD-10-CM

## 2020-04-09 DIAGNOSIS — I4892 Unspecified atrial flutter: Secondary | ICD-10-CM | POA: Diagnosis not present

## 2020-04-09 MED ORDER — METOPROLOL TARTRATE 50 MG PO TABS
ORAL_TABLET | ORAL | 0 refills | Status: DC
Start: 2020-04-09 — End: 2020-04-09

## 2020-04-09 MED ORDER — LOSARTAN POTASSIUM 25 MG PO TABS
25.0000 mg | ORAL_TABLET | Freq: Every day | ORAL | 3 refills | Status: DC
Start: 2020-04-09 — End: 2020-05-13

## 2020-04-09 MED ORDER — METOPROLOL TARTRATE 50 MG PO TABS: ORAL_TABLET | ORAL | 0 refills | Status: DC

## 2020-04-09 NOTE — Patient Instructions (Signed)
Medication Instructions:  START Losartan 25 mg daily  AM OF CT scan: HOLD LOSARTAN AND TOPROL XL Take metoprolol 50 mg two hours prior to scan  *If you need a refill on your cardiac medications before your next appointment, please call your pharmacy*   Lab Work: BMET, LIPID in 1 WEEK   If you have labs (blood work) drawn today and your tests are completely normal, you will receive your results only by: Marland Kitchen MyChart Message (if you have MyChart) OR . A paper copy in the mail If you have any lab test that is abnormal or we need to change your treatment, we will call you to review the results.   Testing/Procedures: Your physician has requested that you have cardiac CT. Cardiac computed tomography (CT) is a painless test that uses an x-ray machine to take clear, detailed pictures of your heart. For further information please visit [email or web address].[email or web address]. Please follow instruction sheet as given.  Follow-Up: At [facility], you and your health needs are our priority.  As part of our continuing mission to provide you with exceptional heart care, we have created designated Provider Care Teams.  These Care Teams include your primary Cardiologist (physician) and Advanced Practice Providers (APPs -  Physician Assistants and Nurse Practitioners) who all work together to provide you with the care you need, when you need it.  We recommend signing up for the patient portal called "MyChart".  Sign up information is provided on this After Visit Summary.  MyChart is used to connect with patients for Virtual Visits (Telemedicine).  Patients are able to view lab/test results, encounter notes, upcoming appointments, etc.  Non-urgent messages can be sent to your provider as well.   To learn more about what you can do with MyChart, go to [email or web address].    Your next appointment:   2 month(s)  The format for your next appointment:   In Person  Provider:   Oswaldo Milian, MD

## 2020-04-09 NOTE — Progress Notes (Signed)
Cardiology Office Note:    Date:  04/11/2020   ID:  Danny Juarez, DOB 17-Oct-1956, MRN [id number]  PCP:  Patient, No Pcp Per  Cardiologist:  Donato Heinz, MD  Electrophysiologist:  None   Referring MD: No ref. provider found   Chief Complaint  Patient presents with  . Chest Pain    History of Present Illness:    Danny Juarez is a 64 y.o. male with a hx of multiple myeloma status post bone marrow transplant at [facility] 09/2019, pulmonary embolism who presents for follow-up.  He was referred by Dr. Maylon Peppers for evaluation of chest pain, initially seen on 02/20/2020.  Reports that since January he has been having right-sided chest pain.  Describes as dull aching pain, primarily occurs in the morning when he is walking his dog.  Has only noted the chest pain when exercising, though states that sometimes he is able to do significant exertion without any chest pain.  At initial clinic visit on 02/20/2020, EKG showed atrial flutter with 3:1 conduction, rate 94.  He was started on metoprolol 25 mg daily for rate control in Eliquis 5 mg twice daily.  Discussed anticoagulation with Dr. Maylon Peppers.  TTE on 03/03/2020 showed LVEF 35 to 40%, diffuse hypokinesis worse in inferior wall with abnormal septal motion, normal RV systolic function, mild MR, IVC small/collapsible.  Underwent successful DCCV on 03/29/2020.  Since last clinic visit, reports that he continues to have chest pain, describes.as dull achign pain on right side of chest.  Occurs with exertion, resolves with rest after a few minutes.  He denies any dyspnea, lower extremity edema, lightheadedness, palpitations, or syncope.  He is tolerating Eliquis, denies any bleeding issues.    Past Medical History:  Diagnosis Date  . Anemia    due to chemo   . Cancer ([place])    Multiple myeloma with  Bone Mets  . Leukopenia    chemo related  . PE (pulmonary embolism) 1996    "3 in right lung"  . 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 CARDIOVERSION N/A 03/29/2020   Procedure: CARDIOVERSION;  Surgeon: Acie Fredrickson Wonda Cheng, MD;  Location: [place];  Service: Cardiovascular;  Laterality: N/A;  . HUMERUS IM NAIL Left 04/08/2019   Procedure: INTRAMEDULLARY (IM) NAIL HUMERUS;  Surgeon: Nicholes Stairs, MD;  Location: [place];  Service: Orthopedics;  Laterality: Left;  120 mins  . IR IMAGING GUIDED PORT INSERTION  07/25/2019  . KNEE ARTHROSCOPY Left     Current Medications: Current Meds  Medication Sig  . acyclovir (ZOVIRAX) 800 MG tablet Take 800 mg by mouth 2 (two) times daily.  Marland Kitchen apixaban (ELIQUIS) 5 MG TABS tablet Take 1 tablet (5 mg total) by mouth 2 (two) times daily.  Marland Kitchen CALCIUM-VITAMIN D PO Take 1 tablet by mouth daily.   . ferrous sulfate 325 (65 FE) MG tablet Take 325 mg by mouth daily with breakfast.  . folic acid (FOLVITE) 1 MG tablet Take 1 mg by mouth daily.   Marland Kitchen ibuprofen (ADVIL) 200 MG tablet Take 400 mg by mouth daily as needed (hip pain.).  Marland Kitchen metoprolol succinate (TOPROL XL) 25 MG 24 hr tablet Take 1 tablet (25 mg total) by mouth daily.  . Multiple Vitamin (MULTIVITAMIN WITH MINERALS) TABS Take 1 tablet by mouth daily.  . [EXPIRED] pomalidomide (POMALYST) 1 MG capsule Take 1 capsule (1 mg total) by mouth daily for 21 days. Take  one capsule by mouth daily for three weeks on, one week off.  . sulfamethoxazole-trimethoprim (BACTRIM DS) 800-160 MG tablet Take 1 tablet by mouth every Monday, Wednesday, and Friday.      Allergies:   Patient has no known allergies.   Social History   Socioeconomic History  . Marital status: Divorced    Spouse name: Not on file  . Number of children: 1  . Years of education: Not on file  . Highest education level: Not on file  Occupational History  . Not on file  Tobacco Use  . Smoking status: Never Smoker  . Smokeless tobacco: Never Used  Substance and Sexual Activity  . Alcohol use: Yes    Comment: occassional   . Drug use: No  . Sexual activity: Not on file  Other Topics Concern  . Not on file  Social History Narrative   Patient is divorced with one daughter that lives in [place], [place].   Patient has never smoked nor used smokeless tobacco.   Patient with occasional use of alcohol.   Patient denies use of illicit drugs.   Patient is a retired Set designer.   Social Determinants of Health   Financial Resource Strain:   . Difficulty of Paying Living Expenses:   Food Insecurity:   . Worried About Charity fundraiser in the Last Year:   . Arboriculturist in the Last Year:   Transportation Needs:   . Film/video editor (Medical):   Marland Kitchen Lack of Transportation (Non-Medical):   Physical Activity:   . Days of Exercise per Week:   . Minutes of Exercise per Session:   Stress:   . Feeling of Stress :   Social Connections:   . Frequency of Communication with Friends and Family:   . Frequency of Social Gatherings with Friends and Family:   . Attends Religious Services:   . Active Member of Clubs or Organizations:   . Attends Archivist Meetings:   Marland Kitchen Marital Status:      Family History: The patient's family history is not on file.  ROS:   Please see the history of present illness.     All other systems reviewed and are negative.  EKGs/Labs/Other Studies Reviewed:    The following studies were reviewed today:   EKG:  EKG is ordered today.  The ekg ordered 04/11/2020 demonstrates normal sinus rhythm, rate 71, T wave inversions in leads III, aVF  TTE 03/03/20: 1. Diffuse hypokinesis worse in the inferior wall with abnormal septal  motion Patient in atrial flutter throughout the study . Left ventricular  ejection fraction, by estimation, is 35 to 40%. The left ventricle has  moderately decreased function. The  left ventricle demonstrates global hypokinesis. The left ventricular  internal cavity size was mildly dilated. Left ventricular diastolic   parameters are indeterminate.  2. Right ventricular systolic function is normal. The right ventricular  size is normal. There is normal pulmonary artery systolic pressure.  3. Left atrial size was mildly dilated.  4. The mitral valve is normal in structure. Mild mitral valve  regurgitation. No evidence of mitral stenosis.  5. The aortic valve is tricuspid. Aortic valve regurgitation is not  visualized. Mild aortic valve sclerosis is present, with no evidence of  aortic valve stenosis.  6. The inferior vena cava is normal in size with greater than 50%  respiratory variability, suggesting right atrial pressure of 3 mmHg.   Recent Labs: 09/09/2019: TSH 0.683  01/27/2020: Magnesium 2.1 03/19/2020: ALT 19 03/25/2020: BUN 23; Creatinine, Ser 0.94; Hemoglobin 13.9; Platelets 176; Potassium 4.9; Sodium 139  Recent Lipid Panel No results found for: CHOL, TRIG, HDL, CHOLHDL, VLDL, LDLCALC, LDLDIRECT  Physical Exam:    VS:  BP 118/70   Pulse 71   Temp (!) 97.2 F (36.2 C)   Ht _0  (1.727 m)   Wt 160 lb 9.6 oz (72.8 kg)   SpO2 97%   BMI 24.42 kg/m     Wt Readings from Last 3 Encounters:  04/09/20 160 lb 9.6 oz (72.8 kg)  03/29/20 167 lb 1.7 oz (75.8 kg)  03/15/20 167 lb 3.2 oz (75.8 kg)     GEN:   in no acute distress HEENT: Normal NECK: No JVD CARDIAC: RRR, no murmurs, rubs, gallops RESPIRATORY:  Clear to auscultation without rales, wheezing or rhonchi  ABDOMEN: Soft, non-tender, non-distended MUSCULOSKELETAL:  No edema; No deformity  SKIN: Warm and dry NEUROLOGIC:  Alert and oriented x 3 PSYCHIATRIC:  Normal affect   ASSESSMENT:    1. Chest pain of uncertain etiology   2. Chronic combined systolic and diastolic CHF (congestive heart failure) ([place])   3. Atrial flutter, unspecified type [facility])    PLAN:     Atrial flutter: diagnosed at initial clinic visit on 02/20/2020. CHA2DS2-VASc score 1 given new diagnosis of systolic heart failure.  Appears symptomatic.  TTE 03/03/20  showed LVEF 35 to 40%, diffuse hypokinesis worse in inferior wall with abnormal septal motion, normal RV systolic function, mild MR, IVC small/collapsible.  Underwent successful cardioversion on 03/29/2020, remains in normal sinus rhythm -Continue Toprol-XL 25 mg daily -Continue Eliquis 5 mg twice daily for anticoagulation.  He has a history of multiple myeloma status post bone marrow transplant in November, his counts are currently stable.  OK to anticoagulate per Dr Maylon Peppers.  Given his CHA2DS2-VASc score is 1, may not need long-term anticoagulation. He will need to complete 4 weeks of uninterrupted anticoagulation post cardioversion (though 04/29/20).  At that point, Eliquis could be discontinued if needed  Chest pain: Atypical in description, as describes right-sided chest pain, though does occur with exertion.  However has continued to have chest pain despite now being back in sinus rhythm.  Will evaluate further with coronary CTA  Chronic combined systolic and diastolic heart failure: EF 35 to 40%.  Appears euvolemic.  May be tachycardia induced due to atrial flutter.  Ischemia also on differential given his chest pain.   -Continue Toprol-XL 25 mg daily -Start losartan 25 mg daily.  Check BMP in 1 week -Coronary CTA as above -Plan repeat TTE 2 to 3 months after cardioversion   RTC in 2 months   Medication Adjustments/Labs and Tests Ordered: Current medicines are reviewed at length with the patient today.  Concerns regarding medicines are outlined above.  Orders Placed This Encounter  Procedures  . CT CORONARY MORPH W/CTA COR W/SCORE W/CA W/CM &/OR WO/CM  . CT CORONARY FRACTIONAL FLOW RESERVE DATA PREP  . CT CORONARY FRACTIONAL FLOW RESERVE FLUID ANALYSIS  . Basic metabolic panel  . Lipid panel   Meds ordered this encounter  Medications  . DISCONTD: metoprolol tartrate (LOPRESSOR) 50 MG tablet    Sig: Take 50 mg (1 tablet) TWO hours prior to CT HOLD TOPROL XL AND LOSARTAN AM OF CT     Dispense:  1 tablet    Refill:  0  . losartan (COZAAR) 25 MG tablet    Sig: Take 1 tablet (25 mg total)  by mouth daily.    Dispense:  90 tablet    Refill:  3  . metoprolol tartrate (LOPRESSOR) 50 MG tablet    Sig: Take 50 mg (1 tablet) TWO hours prior to CT HOLD TOPROL XL AND LOSARTAN AM OF CT    Dispense:  1 tablet    Refill:  0    Patient Instructions  Medication Instructions:  START Losartan 25 mg daily  AM OF CT scan: HOLD LOSARTAN AND TOPROL XL Take metoprolol 50 mg two hours prior to scan  *If you need a refill on your cardiac medications before your next appointment, please call your pharmacy*   Lab Work: BMET, LIPID in 1 WEEK   If you have labs (blood work) drawn today and your tests are completely normal, you will receive your results only by: Marland Kitchen MyChart Message (if you have MyChart) OR . A paper copy in the mail If you have any lab test that is abnormal or we need to change your treatment, we will call you to review the results.   Testing/Procedures: Your physician has requested that you have cardiac CT. Cardiac computed tomography (CT) is a painless test that uses an x-ray machine to take clear, detailed pictures of your heart. For further information please visit [email or web address].[email or web address]. Please follow instruction sheet as given.  Follow-Up: At [facility], you and your health needs are our priority.  As part of our continuing mission to provide you with exceptional heart care, we have created designated Provider Care Teams.  These Care Teams include your primary Cardiologist (physician) and Advanced Practice Providers (APPs -  Physician Assistants and Nurse Practitioners) who all work together to provide you with the care you need, when you need it.  We recommend signing up for the patient portal called "MyChart".  Sign up information is provided on this After Visit Summary.  MyChart is used to connect with patients for Virtual Visits (Telemedicine).  Patients are able  to view lab/test results, encounter notes, upcoming appointments, etc.  Non-urgent messages can be sent to your provider as well.   To learn more about what you can do with MyChart, go to [email or web address].    Your next appointment:   2 month(s)  The format for your next appointment:   In Person  Provider:   Oswaldo Milian, MD         Signed, Donato Heinz, MD  04/11/2020 3:46 PM    [place]

## 2020-04-14 ENCOUNTER — Telehealth: Payer: Self-pay | Admitting: *Deleted

## 2020-04-14 DIAGNOSIS — I4892 Unspecified atrial flutter: Secondary | ICD-10-CM

## 2020-04-14 DIAGNOSIS — I484 Atypical atrial flutter: Secondary | ICD-10-CM

## 2020-04-14 NOTE — Telephone Encounter (Signed)
I called patient and added him on tomorrow for labs/MD/chemotheapy appointments. He verbalized understanding.

## 2020-04-15 ENCOUNTER — Encounter: Payer: Self-pay | Admitting: Family

## 2020-04-15 ENCOUNTER — Inpatient Hospital Stay (HOSPITAL_BASED_OUTPATIENT_CLINIC_OR_DEPARTMENT_OTHER): Payer: 59 | Admitting: Family

## 2020-04-15 ENCOUNTER — Inpatient Hospital Stay: Payer: 59

## 2020-04-15 ENCOUNTER — Inpatient Hospital Stay: Payer: 59 | Attending: Hematology

## 2020-04-15 ENCOUNTER — Other Ambulatory Visit: Payer: Self-pay

## 2020-04-15 VITALS — BP 100/85 | HR 54 | Temp 97.8°F | Resp 18 | Ht 68.0 in | Wt 159.1 lb

## 2020-04-15 DIAGNOSIS — C9000 Multiple myeloma not having achieved remission: Secondary | ICD-10-CM | POA: Diagnosis present

## 2020-04-15 DIAGNOSIS — C9001 Multiple myeloma in remission: Secondary | ICD-10-CM

## 2020-04-15 DIAGNOSIS — Z7189 Other specified counseling: Secondary | ICD-10-CM

## 2020-04-15 DIAGNOSIS — Z5112 Encounter for antineoplastic immunotherapy: Secondary | ICD-10-CM | POA: Diagnosis not present

## 2020-04-15 MED ORDER — DEXAMETHASONE 4 MG PO TABS: 20.0000 mg | ORAL_TABLET | Freq: Once | ORAL | Status: DC

## 2020-04-15 MED ORDER — DIPHENHYDRAMINE HCL 25 MG PO CAPS: 50.0000 mg | ORAL_CAPSULE | Freq: Once | ORAL | Status: DC

## 2020-04-15 MED ORDER — ACETAMINOPHEN 325 MG PO TABS: 650.0000 mg | ORAL_TABLET | Freq: Once | ORAL | Status: DC

## 2020-04-15 MED ORDER — HEPARIN SOD (PORK) LOCK FLUSH 100 UNIT/ML IV SOLN
500.0000 [IU] | Freq: Once | INTRAVENOUS | Status: AC
  Administered 2020-04-15: 500 [IU] via INTRAVENOUS
  Filled 2020-04-15: qty 5

## 2020-04-15 MED ORDER — DARATUMUMAB-HYALURONIDASE-FIHJ 1800-30000 MG-UT/15ML ~~LOC~~ SOLN
1800.0000 mg | Freq: Once | SUBCUTANEOUS | Status: AC
  Administered 2020-04-15: 1800 mg via SUBCUTANEOUS
  Filled 2020-04-15: qty 15

## 2020-04-15 MED ORDER — SODIUM CHLORIDE 0.9% FLUSH
10.0000 mL | Freq: Once | INTRAVENOUS | Status: DC
  Administered 2020-04-15: 10 mL via INTRAVENOUS
  Filled 2020-04-15: qty 10

## 2020-04-15 NOTE — Progress Notes (Signed)
Hematology and Oncology Follow Up Visit  Danny Juarez [id number] January 04, 1956 64 y.o. 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 Norma Juarez   Current Therapy: Maintenance Daratumumab (monthly) with Pomalidomide 65m (3 weeks on/1 week off) Zometa every 3 months; resumed in 12/2019 post-transplant, plan for 2 years    Interim History:  Danny Juarez here today for follow-up and treatment. He is doing fairly well but has had issues with new onset of atrial flutter. He had a successful cardioversion on 03/29/2020 and is currently on Eliquis for 6 weeks. Heart rate and rhythm regular on ascultation.  He has had no issues with bleeding. No abnormal bruising or petechiae.  He follows up again with cardiology in July.  April M-spike was 0.1, IgG level 389 mg/dL and lambda light chains 12.0 mg/L. CBC and CMP were drawn yesterday with [facility] follow-up.  No fever, chills, n/v, cough, rash, dizziness, SOB, palpitations, abdominal pain or changes in bowel or bladder habits.  No swelling , tenderness, numbness or tingling in his extremities at this time.  He ambulates with a cane for added support. No falls or syncopal episodes to report.  He has maintained a good appetite and is staying well hydrated. His weight is stable.   ECOG Performance Status: 1 - Symptomatic but completely ambulatory  Medications:  Allergies as of 04/15/2020   No Known Allergies     Medication List       Accurate as of Apr 15, 2020  2:11 PM. If you have any questions, ask your nurse or  doctor.        STOP taking these medications   sulfamethoxazole-trimethoprim 800-160 MG tablet Commonly known as: BACTRIM DS Stopped by: SLaverna Peace NP     TAKE these medications   acyclovir 800 MG tablet Commonly known as: ZOVIRAX Take 800 mg by mouth 2 (two) times daily.   apixaban 5 MG Tabs tablet Commonly known as: Eliquis Take 1 tablet (5 mg total) by mouth 2 (two) times daily.   CALCIUM-VITAMIN D PO Take 1 tablet by mouth daily.   ferrous sulfate 325 (65 FE) MG tablet Take 325 mg by mouth daily with breakfast.   folic acid 1 MG tablet Commonly known as: FOLVITE Take 1 mg by mouth daily.   ibuprofen 200 MG tablet Commonly known as: ADVIL Take 400 mg by mouth daily as needed (hip pain.).   losartan 25 MG tablet Commonly known as: COZAAR Take 1 tablet (25 mg total) by mouth daily.   metoprolol succinate 25 MG 24 hr tablet Commonly known as: Toprol XL Take 1 tablet (25 mg total) by mouth daily.   metoprolol tartrate 50 MG tablet Commonly known as: LOPRESSOR Take 50 mg (1 tablet) TWO hours prior to CT HOLD TOPROL XL AND LOSARTAN AM OF CT   multivitamin with minerals Tabs tablet Take 1 tablet by mouth daily.       Allergies: No Known Allergies  Past Medical History, Surgical history, Social history, and Family History were reviewed and updated.  Review of Systems: All  other 10 point review of systems is negative.   Physical Exam:  height is '5\' 8"'  (1.727 m) and weight is 159 lb 1.3 oz (72.2 kg). His temporal temperature is 97.8 F (36.6 C). His blood pressure is 100/85 and his pulse is 54 (abnormal). His respiration is 18 and oxygen saturation is 100%.   Wt Readings from Last 3 Encounters:  04/15/20 159 lb 1.3 oz (72.2 kg)  04/09/20 160 lb 9.6 oz (72.8 kg)  03/29/20 167 lb 1.7 oz (75.8 kg)    Ocular: Sclerae unicteric, pupils equal, round and reactive to light Ear-nose-throat: Oropharynx clear, dentition fair Lymphatic: No cervical or  supraclavicular adenopathy Lungs no rales or rhonchi, good excursion bilaterally Heart regular rate and rhythm, no murmur appreciated Abd soft, nontender, positive bowel sounds, no liver or spleen tip palpated on exam, no fluid wave  MSK no focal spinal tenderness, no joint edema Neuro: non-focal, well-oriented, appropriate affect Breasts: Deferred   Lab Results  Component Value Date   WBC 3.1 (L) 03/25/2020   HGB 13.9 03/25/2020   HCT 40.3 03/25/2020   MCV 90 03/25/2020   PLT 176 03/25/2020   No results found for: FERRITIN, IRON, TIBC, UIBC, IRONPCTSAT Lab Results  Component Value Date   RBC 4.47 03/25/2020   Lab Results  Component Value Date   KPAFRELGTCHN 15.0 03/19/2020   LAMBDASER 12.0 03/19/2020   KAPLAMBRATIO 1.25 03/19/2020   Lab Results  Component Value Date   IGGSERUM 389 (L) 03/19/2020   IGA 10 (L) 03/19/2020   IGMSERUM 27 03/19/2020   Lab Results  Component Value Date   TOTALPROTELP 5.5 (L) 03/19/2020     Chemistry      Component Value Date/Time   NA 139 03/25/2020 1423   K 4.9 03/25/2020 1423   CL 102 03/25/2020 1423   CO2 25 03/25/2020 1423   BUN 23 03/25/2020 1423   CREATININE 0.94 03/25/2020 1423   CREATININE 0.83 03/19/2020 1040      Component Value Date/Time   CALCIUM 9.7 03/25/2020 1423   ALKPHOS 49 03/19/2020 1040   AST 20 03/19/2020 1040   ALT 19 03/19/2020 1040   BILITOT 0.7 03/19/2020 1040       Impression and Plan: Danny Juarez is a very pleasant 64 yo caucasian gentleman with 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 and DS.  He is s/p autologous stem cell transplant at [facility] 11/202.  He is currently on maintenance Daratumumab and Pomalyst and tolerating these nicely.  We will proceed with treatment today per Dr. Marin Olp. We will see him again in another month.  He promises to contact our office with any questions or concerns. We can certainly see him sooner if needed.   Laverna Peace, NP 5/27/20212:11 PM

## 2020-04-15 NOTE — Patient Instructions (Signed)
Tunneled Central Venous Catheter Flushing Guide  It is important to flush your tunneled central venous catheter each time you use it, both before and after you use it. Flushing your catheter will help prevent it from clogging. What are the risks? Risks may include:  Infection.  Air getting into the catheter and bloodstream. Supplies needed:  A clean pair of gloves.  A disinfecting wipe. Use an alcohol wipe, chlorhexidine wipe, or iodine wipe as told by your health care provider.  A 10 mL syringe that has been prefilled with saline solution.  An empty 10 mL syringe, if a substance called heparin was injected into your catheter. How to flush your catheter When you flush your catheter, make sure you follow any specific instructions from your health care provider or the manufacturer. These are general guidelines. Flushing your catheter before use If there is heparin in your catheter: 1. Wash your hands with soap and water. 2. Put on gloves. 3. Scrub the injection cap for a minimum of 15 seconds with a disinfecting wipe. 4. Unclamp the catheter. 5. Attach the empty syringe to the injection cap. 6. Pull the syringe plunger back and withdraw 10 mL of blood. 7. Place the syringe into an appropriate waste container. 8. Scrub the injection cap for 15 seconds with a disinfecting wipe. 9. Attach the prefilled syringe to the injection cap. 10. Flush the catheter by pushing the plunger forward until all the liquid from the syringe is in the catheter. 11. Remove the syringe from the injection cap. 12. Clamp the catheter. If there is no heparin in your catheter: 1. Wash your hands with soap and water. 2. Put on gloves. 3. Scrub the injection cap for 15 seconds with a disinfecting wipe. 4. Unclamp the catheter. 5. Attach the prefilled syringe to the injection cap. 6. Flush the catheter by pushing the plunger forward until 5 mL of the liquid from the syringe is in the catheter. 7. Pull back on  the syringe until you see blood in the catheter. 8. If you have been asked to collect any blood, follow your health care provider's instructions. Otherwise, flush the catheter with the rest of the solution from the syringe. 9. Remove the syringe from the injection cap. 10. Clamp the catheter.  Flushing your catheter after use 1. Wash your hands with soap and water. 2. Put on gloves. 3. Scrub the injection cap for 15 seconds with a disinfecting wipe. 4. Unclamp the catheter. 5. Attach the prefilled syringe to the injection cap. 6. Flush the catheter by pushing the plunger forward until all of the liquid from the syringe is in the catheter. 7. Remove the syringe from the injection cap. 8. Clamp the catheter. Problems and solutions  If blood cannot be completely cleared from the injection cap, you may need to have the injection cap replaced.  If the catheter is difficult to flush, use the pulsing method. The pulsing method involves pushing only a few milliliters of solution into the catheter at a time and pausing between pushes.  If you do not see blood in the catheter when you pull back on the syringe, change your body position, such as by raising your arms above your head. Take a deep breath and cough. Then, pull back on the syringe. If you still do not see blood, flush the catheter with a small amount of solution. Then, change positions again and take a breath or cough. Pull back on the syringe again. If you still do not see   blood, finish flushing the catheter and contact your health care provider. Do not use your catheter until your health care provider says it is okay. General tips  Have someone help you flush your catheter, if possible.  Do not force fluid through your catheter.  Do not use a syringe that is larger or smaller than 10 mL. Using a smaller syringe can make the catheter burst.  Do not use your catheter without flushing it first if it has heparin in it. Contact a health  care provider if:  You cannot see any blood in the catheter when you flush it before using it.  Your catheter is difficult to flush. Get help right away if:  You cannot flush the catheter.  The catheter leaks when you flush it or when there is fluid in it.  There are cracks or breaks in the catheter. Summary  It is important to flush your tunneled central venous catheter each time you use it, both before and after you use it.  Scrub the injection cap for 15 seconds with a disinfecting wipe before and after you flush it.  When you flush your catheter, make sure you follow any specific instructions from your health care provider or the manufacturer.  Get help right away if you cannot flush the catheter. This information is not intended to replace advice given to you by your health care provider. Make sure you discuss any questions you have with your health care provider. Document Revised: 08/01/2019 Document Reviewed: 01/22/2019 Elsevier Patient Education  2020 [employer]vier Inc.  

## 2020-04-15 NOTE — Patient Instructions (Signed)
Daratumumab injection What is this medicine? DARATUMUMAB (dar a toom ue mab) is a monoclonal antibody. It is used to treat multiple myeloma. This medicine may be used for other purposes; ask your health care provider or pharmacist if you have questions. COMMON BRAND NAME(S): DARZALEX What should I tell my health care provider before I take this medicine? They need to know if you have any of these conditions:  infection (especially a virus infection such as chickenpox, herpes, or hepatitis B virus)  lung or breathing disease  an unusual or allergic reaction to daratumumab, other medicines, foods, dyes, or preservatives  pregnant or trying to get pregnant  breast-feeding How should I use this medicine? This medicine is for infusion into a vein. It is given by a health care professional in a hospital or clinic setting. Talk to your pediatrician regarding the use of this medicine in children. Special care may be needed. Overdosage: If you think you have taken too much of this medicine contact a poison control center or emergency room at once. NOTE: This medicine is only for you. Do not share this medicine with others. What if I miss a dose? Keep appointments for follow-up doses as directed. It is important not to miss your dose. Call your doctor or health care professional if you are unable to keep an appointment. What may interact with this medicine? Interactions have not been studied. This list may not describe all possible interactions. Give your health care provider a list of all the medicines, herbs, non-prescription drugs, or dietary supplements you use. Also tell them if you smoke, drink alcohol, or use illegal drugs. Some items may interact with your medicine. What should I watch for while using this medicine? This drug may make you feel generally unwell. Report any side effects. Continue your course of treatment even though you feel ill unless your doctor tells you to stop. This  medicine can cause serious allergic reactions. To reduce your risk you may need to take medicine before treatment with this medicine. Take your medicine as directed. This medicine can affect the results of blood tests to match your blood type. These changes can last for up to 6 months after the final dose. Your healthcare provider will do blood tests to match your blood type before you start treatment. Tell all of your healthcare providers that you are being treated with this medicine before receiving a blood transfusion. This medicine can affect the results of some tests used to determine treatment response; extra tests may be needed to evaluate response. Do not become pregnant while taking this medicine or for 3 months after stopping it. Women should inform their doctor if they wish to become pregnant or think they might be pregnant. There is a potential for serious side effects to an unborn child. Talk to your health care professional or pharmacist for more information. What side effects may I notice from receiving this medicine? Side effects that you should report to your doctor or health care professional as soon as possible:  allergic reactions like skin rash, itching or hives, swelling of the face, lips, or tongue  breathing problems  chills  cough  dizziness  feeling faint or lightheaded  headache  low blood counts - this medicine may decrease the number of white blood cells, red blood cells and platelets. You may be at increased risk for infections and bleeding.  nausea, vomiting  shortness of breath  signs of decreased platelets or bleeding - bruising, pinpoint red spots on  the skin, black, tarry stools, blood in the urine  signs of decreased red blood cells - unusually weak or tired, feeling faint or lightheaded, falls  signs of infection - fever or chills, cough, sore throat, pain or difficulty passing urine  signs and symptoms of liver injury like dark yellow or brown  urine; general ill feeling or flu-like symptoms; light-colored stools; loss of appetite; right upper belly pain; unusually weak or tired; yellowing of the eyes or skin Side effects that usually do not require medical attention (report to your doctor or health care professional if they continue or are bothersome):  back pain  constipation  diarrhea  joint pain  muscle cramps  pain, tingling, numbness in the hands or feet  swelling of the ankles, feet, hands  tiredness  trouble sleeping This list may not describe all possible side effects. Call your doctor for medical advice about side effects. You may report side effects to FDA at 1-800-FDA-1088. Where should I keep my medicine? This drug is given in a hospital or clinic and will not be stored at home. NOTE: This sheet is a summary. It may not cover all possible information. If you have questions about this medicine, talk to your doctor, pharmacist, or health care provider.  2020 Elsevier/Gold Standard (2019-07-15 18:10:54)  

## 2020-04-16 LAB — KAPPA/LAMBDA LIGHT CHAINS
Kappa free light chain: 17.9 mg/L (ref 3.3–19.4)
Kappa, lambda light chain ratio: 1.44 (ref 0.26–1.65)
Lambda free light chains: 12.4 mg/L (ref 5.7–26.3)

## 2020-04-16 LAB — LIPID PANEL
Chol/HDL Ratio: 2.7 ratio (ref 0.0–5.0)
Cholesterol, Total: 152 mg/dL (ref 100–199)
HDL: 56 mg/dL (ref >39)
LDL Chol Calc (NIH): 80 mg/dL (ref 0–99)
Triglycerides: 83 mg/dL (ref 0–149)
VLDL Cholesterol Cal: 16 mg/dL (ref 5–40)

## 2020-04-16 LAB — BASIC METABOLIC PANEL
BUN/Creatinine Ratio: 22 (ref 10–24)
BUN: 19 mg/dL (ref 8–27)
CO2: 22 mmol/L (ref 20–29)
Calcium: 9.2 mg/dL (ref 8.6–10.2)
Chloride: 104 mmol/L (ref 96–106)
Creatinine, Ser: 0.87 mg/dL (ref 0.76–1.27)
GFR calc Af Amer: 105 mL/min/{1.73_m2} (ref >59)
GFR calc non Af Amer: 91 mL/min/{1.73_m2} (ref >59)
Glucose: 84 mg/dL (ref 65–99)
Potassium: 4.6 mmol/L (ref 3.5–5.2)
Sodium: 137 mmol/L (ref 134–144)

## 2020-04-21 ENCOUNTER — Other Ambulatory Visit: Payer: Self-pay | Admitting: Hematology

## 2020-04-21 DIAGNOSIS — C9000 Multiple myeloma not having achieved remission: Secondary | ICD-10-CM

## 2020-04-21 DIAGNOSIS — C9001 Multiple myeloma in remission: Secondary | ICD-10-CM

## 2020-04-21 LAB — MULTIPLE MYELOMA PANEL, SERUM
Albumin SerPl Elph-Mcnc: 3.5 g/dL (ref 2.9–4.4)
Albumin/Glob SerPl: 1.8 — ABNORMAL HIGH (ref 0.7–1.7)
Alpha 1: 0.2 g/dL (ref 0.0–0.4)
Alpha2 Glob SerPl Elph-Mcnc: 0.6 g/dL (ref 0.4–1.0)
B-Globulin SerPl Elph-Mcnc: 0.8 g/dL (ref 0.7–1.3)
Gamma Glob SerPl Elph-Mcnc: 0.3 g/dL — ABNORMAL LOW (ref 0.4–1.8)
Globulin, Total: 2 g/dL — ABNORMAL LOW (ref 2.2–3.9)
IgA: 11 mg/dL — ABNORMAL LOW (ref 61–437)
IgG (Immunoglobin G), Serum: 419 mg/dL — ABNORMAL LOW (ref 603–1613)
IgM (Immunoglobulin M), Srm: 28 mg/dL (ref 20–172)
M Protein SerPl Elph-Mcnc: 0.1 g/dL — ABNORMAL HIGH
Total Protein ELP: 5.5 g/dL — ABNORMAL LOW (ref 6.0–8.5)

## 2020-04-22 ENCOUNTER — Other Ambulatory Visit: Payer: Self-pay | Admitting: *Deleted

## 2020-04-22 DIAGNOSIS — C9001 Multiple myeloma in remission: Secondary | ICD-10-CM

## 2020-04-22 DIAGNOSIS — C9000 Multiple myeloma not having achieved remission: Secondary | ICD-10-CM

## 2020-04-22 MED ORDER — POMALIDOMIDE 1 MG PO CAPS: ORAL_CAPSULE | ORAL | 0 refills | Status: DC

## 2020-04-22 NOTE — Telephone Encounter (Signed)
Dr. Marin Olp and Erline Levine, It looks like you are assuming this patient's care.  Sending refill request to you and Erline Levine.  Thanks! Threasa Beards

## 2020-04-26 NOTE — Addendum Note (Signed)
Addended by: Merri Ray A on: 04/26/2020 03:56 PM   Modules accepted: Orders

## 2020-05-13 ENCOUNTER — Other Ambulatory Visit: Payer: Self-pay

## 2020-05-13 ENCOUNTER — Encounter: Payer: Self-pay | Admitting: Hematology & Oncology

## 2020-05-13 ENCOUNTER — Inpatient Hospital Stay (HOSPITAL_BASED_OUTPATIENT_CLINIC_OR_DEPARTMENT_OTHER): Payer: 59 | Admitting: Hematology & Oncology

## 2020-05-13 ENCOUNTER — Inpatient Hospital Stay: Payer: 59

## 2020-05-13 ENCOUNTER — Inpatient Hospital Stay: Payer: 59 | Attending: Hematology

## 2020-05-13 VITALS — BP 99/55 | HR 56 | Temp 97.3°F | Resp 20 | Wt 154.0 lb

## 2020-05-13 VITALS — BP 99/55 | HR 56 | Temp 97.3°F | Resp 18 | Ht 68.0 in | Wt 154.0 lb

## 2020-05-13 DIAGNOSIS — C9000 Multiple myeloma not having achieved remission: Secondary | ICD-10-CM

## 2020-05-13 DIAGNOSIS — Z95828 Presence of other vascular implants and grafts: Secondary | ICD-10-CM

## 2020-05-13 DIAGNOSIS — Z5112 Encounter for antineoplastic immunotherapy: Secondary | ICD-10-CM | POA: Insufficient documentation

## 2020-05-13 DIAGNOSIS — Z7189 Other specified counseling: Secondary | ICD-10-CM

## 2020-05-13 DIAGNOSIS — C9001 Multiple myeloma in remission: Secondary | ICD-10-CM

## 2020-05-13 LAB — CBC WITH DIFFERENTIAL (CANCER CENTER ONLY)
Abs Immature Granulocytes: 0.01 10*3/uL (ref 0.00–0.07)
Basophils Absolute: 0 10*3/uL (ref 0.0–0.1)
Basophils Relative: 1 %
Eosinophils Absolute: 0.2 10*3/uL (ref 0.0–0.5)
Eosinophils Relative: 5 %
HCT: 38.6 % — ABNORMAL LOW (ref 39.0–52.0)
Hemoglobin: 13.4 g/dL (ref 13.0–17.0)
Immature Granulocytes: 0 %
Lymphocytes Relative: 17 %
Lymphs Abs: 0.6 10*3/uL — ABNORMAL LOW (ref 0.7–4.0)
MCH: 31.7 pg (ref 26.0–34.0)
MCHC: 34.7 g/dL (ref 30.0–36.0)
MCV: 91.3 fL (ref 80.0–100.0)
Monocytes Absolute: 0.8 10*3/uL (ref 0.1–1.0)
Monocytes Relative: 23 %
Neutro Abs: 1.9 10*3/uL (ref 1.7–7.7)
Neutrophils Relative %: 54 %
Platelet Count: 88 10*3/uL — ABNORMAL LOW (ref 150–400)
RBC: 4.23 MIL/uL (ref 4.22–5.81)
RDW: 13.8 % (ref 11.5–15.5)
WBC Count: 3.5 10*3/uL — ABNORMAL LOW (ref 4.0–10.5)
nRBC: 0 % (ref 0.0–0.2)

## 2020-05-13 LAB — CMP (CANCER CENTER ONLY)
ALT: 18 U/L (ref 0–44)
AST: 19 U/L (ref 15–41)
Albumin: 4.4 g/dL (ref 3.5–5.0)
Alkaline Phosphatase: 52 U/L (ref 38–126)
Anion gap: 8 (ref 5–15)
BUN: 31 mg/dL — ABNORMAL HIGH (ref 8–23)
CO2: 26 mmol/L (ref 22–32)
Calcium: 9.5 mg/dL (ref 8.9–10.3)
Chloride: 105 mmol/L (ref 98–111)
Creatinine: 0.86 mg/dL (ref 0.61–1.24)
GFR, Est AFR Am: >60 mL/min (ref >60)
GFR, Estimated: >60 mL/min (ref >60)
Glucose, Bld: 123 mg/dL — ABNORMAL HIGH (ref 70–99)
Potassium: 4.1 mmol/L (ref 3.5–5.1)
Sodium: 139 mmol/L (ref 135–145)
Total Bilirubin: 0.8 mg/dL (ref 0.3–1.2)
Total Protein: 6.2 g/dL — ABNORMAL LOW (ref 6.5–8.1)

## 2020-05-13 MED ORDER — HEPARIN SOD (PORK) LOCK FLUSH 100 UNIT/ML IV SOLN
500.0000 [IU] | Freq: Once | INTRAVENOUS | Status: AC
  Administered 2020-05-13: 500 [IU] via INTRAVENOUS
  Filled 2020-05-13: qty 5

## 2020-05-13 MED ORDER — SODIUM CHLORIDE 0.9% FLUSH
10.0000 mL | INTRAVENOUS | Status: DC | PRN
  Administered 2020-05-13: 10 mL via INTRAVENOUS
  Filled 2020-05-13: qty 10

## 2020-05-13 MED ORDER — HEPARIN SOD (PORK) LOCK FLUSH 100 UNIT/ML IV SOLN
500.0000 [IU] | Freq: Once | INTRAVENOUS | Status: DC | PRN
  Filled 2020-05-13: qty 5

## 2020-05-13 MED ORDER — ACETAMINOPHEN 325 MG PO TABS
650.0000 mg | ORAL_TABLET | Freq: Once | ORAL | Status: AC
  Administered 2020-05-13: 650 mg via ORAL

## 2020-05-13 MED ORDER — SODIUM CHLORIDE 0.9% FLUSH
10.0000 mL | INTRAVENOUS | Status: DC | PRN
  Administered 2020-05-13 (×2): 10 mL
  Filled 2020-05-13: qty 10

## 2020-05-13 MED ORDER — DIPHENHYDRAMINE HCL 25 MG PO CAPS
ORAL_CAPSULE | ORAL | Status: AC
  Filled 2020-05-13: qty 2

## 2020-05-13 MED ORDER — DEXAMETHASONE 4 MG PO TABS
20.0000 mg | ORAL_TABLET | Freq: Once | ORAL | Status: AC
  Administered 2020-05-13: 20 mg via ORAL

## 2020-05-13 MED ORDER — DARATUMUMAB-HYALURONIDASE-FIHJ 1800-30000 MG-UT/15ML ~~LOC~~ SOLN
1800.0000 mg | Freq: Once | SUBCUTANEOUS | Status: AC
  Administered 2020-05-13: 1800 mg via SUBCUTANEOUS
  Filled 2020-05-13: qty 15

## 2020-05-13 MED ORDER — ACETAMINOPHEN 325 MG PO TABS
ORAL_TABLET | ORAL | Status: AC
  Filled 2020-05-13: qty 2

## 2020-05-13 MED ORDER — DIPHENHYDRAMINE HCL 25 MG PO CAPS
50.0000 mg | ORAL_CAPSULE | Freq: Once | ORAL | Status: AC
  Administered 2020-05-13: 50 mg via ORAL

## 2020-05-13 MED ORDER — SODIUM CHLORIDE 0.9 % IV SOLN
Freq: Once | INTRAVENOUS | Status: DC
  Filled 2020-05-13: qty 250

## 2020-05-13 MED ORDER — DEXAMETHASONE 4 MG PO TABS
ORAL_TABLET | ORAL | Status: AC
  Filled 2020-05-13: qty 5

## 2020-05-13 NOTE — Patient Instructions (Signed)

## 2020-05-13 NOTE — Patient Instructions (Signed)
Daratumumab injection What is this medicine? DARATUMUMAB (dar a toom ue mab) is a monoclonal antibody. It is used to treat multiple myeloma. This medicine may be used for other purposes; ask your health care provider or pharmacist if you have questions. COMMON BRAND NAME(S): DARZALEX What should I tell my health care provider before I take this medicine? They need to know if you have any of these conditions:  infection (especially a virus infection such as chickenpox, herpes, or hepatitis B virus)  lung or breathing disease  an unusual or allergic reaction to daratumumab, other medicines, foods, dyes, or preservatives  pregnant or trying to get pregnant  breast-feeding How should I use this medicine? This medicine is for infusion into a vein. It is given by a health care professional in a hospital or clinic setting. Talk to your pediatrician regarding the use of this medicine in children. Special care may be needed. Overdosage: If you think you have taken too much of this medicine contact a poison control center or emergency room at once. NOTE: This medicine is only for you. Do not share this medicine with others. What if I miss a dose? Keep appointments for follow-up doses as directed. It is important not to miss your dose. Call your doctor or health care professional if you are unable to keep an appointment. What may interact with this medicine? Interactions have not been studied. This list may not describe all possible interactions. Give your health care provider a list of all the medicines, herbs, non-prescription drugs, or dietary supplements you use. Also tell them if you smoke, drink alcohol, or use illegal drugs. Some items may interact with your medicine. What should I watch for while using this medicine? This drug may make you feel generally unwell. Report any side effects. Continue your course of treatment even though you feel ill unless your doctor tells you to stop. This  medicine can cause serious allergic reactions. To reduce your risk you may need to take medicine before treatment with this medicine. Take your medicine as directed. This medicine can affect the results of blood tests to match your blood type. These changes can last for up to 6 months after the final dose. Your healthcare provider will do blood tests to match your blood type before you start treatment. Tell all of your healthcare providers that you are being treated with this medicine before receiving a blood transfusion. This medicine can affect the results of some tests used to determine treatment response; extra tests may be needed to evaluate response. Do not become pregnant while taking this medicine or for 3 months after stopping it. Women should inform their doctor if they wish to become pregnant or think they might be pregnant. There is a potential for serious side effects to an unborn child. Talk to your health care professional or pharmacist for more information. What side effects may I notice from receiving this medicine? Side effects that you should report to your doctor or health care professional as soon as possible:  allergic reactions like skin rash, itching or hives, swelling of the face, lips, or tongue  breathing problems  chills  cough  dizziness  feeling faint or lightheaded  headache  low blood counts - this medicine may decrease the number of white blood cells, red blood cells and platelets. You may be at increased risk for infections and bleeding.  nausea, vomiting  shortness of breath  signs of decreased platelets or bleeding - bruising, pinpoint red spots on   the skin, black, tarry stools, blood in the urine  signs of decreased red blood cells - unusually weak or tired, feeling faint or lightheaded, falls  signs of infection - fever or chills, cough, sore throat, pain or difficulty passing urine  signs and symptoms of liver injury like dark yellow or brown  urine; general ill feeling or flu-like symptoms; light-colored stools; loss of appetite; right upper belly pain; unusually weak or tired; yellowing of the eyes or skin Side effects that usually do not require medical attention (report to your doctor or health care professional if they continue or are bothersome):  back pain  constipation  diarrhea  joint pain  muscle cramps  pain, tingling, numbness in the hands or feet  swelling of the ankles, feet, hands  tiredness  trouble sleeping This list may not describe all possible side effects. Call your doctor for medical advice about side effects. You may report side effects to FDA at 1-800-FDA-1088. Where should I keep my medicine? This drug is given in a hospital or clinic and will not be stored at home. NOTE: This sheet is a summary. It may not cover all possible information. If you have questions about this medicine, talk to your doctor, pharmacist, or health care provider.  2020 Elsevier/Gold Standard (2019-07-15 18:10:54)  

## 2020-05-13 NOTE — Progress Notes (Signed)
Hematology and Oncology Follow Up Visit  Danny Juarez [id number] 08-Mar-1956 64 y.o. 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 Norma Fredrickson   Current Therapy: Maintenance Daratumumab (monthly) with Pomalidomide 38m (3 weeks on/1 week off) Zometa every 3 months; resumed in 12/2019 post-transplant, plan for 2 years  -- next dose 06/2020   Interim History:  Danny Juarez here today for follow-up and treatment.  This is a first time that I am meeting him.  He is a very nice 64year old white male.  [place]  He served time in the [employer]  As such, he is a true [employer] hero.  He was diagnosed with myeloma little over a year ago.  He had bone lesions.  He did develop a pathologic fracture in the left humerus that required surgical pinning.  He never had any type of postop radiation.  [facility]  He got through this nicely.  He looks fantastic.  He does have a a lot of arthritic issues with his knees and with his right hip.  He is trying to avoid surgery.  He has had no issues with the pomalidomide.  There is no rashes.  He has had no diarrhea.  There is no obvious neuropathy.  His last monoclonal spike back in May was 0.1 g/dL.  The IgG level was 419 mg/dL.  His lambda light chain was 1.2 mg/dL.  He has had no problem with infection.  Is no fever.  He has had no cough or shortness of breath.  He does have atrial flutter.  He is  on Eliquis for this.  I think he may be getting off this soon.  Cardiology is managing this.  Currently, his performance status is ECOG 1.  Medications:  Allergies as of 05/13/2020   No Known Allergies     Medication List       Accurate as of May 13, 2020 11:37 AM. If you have any questions, ask your nurse or doctor.        STOP taking these medications   losartan 25 MG tablet Commonly known as: COZAAR Stopped by: PVolanda Napoleon MD   metoprolol tartrate 50 MG tablet Commonly known as: LOPRESSOR Stopped by: PVolanda Napoleon MD     TAKE these medications   acyclovir 800 MG tablet Commonly known as: ZOVIRAX Take 800 mg by mouth 2 (two) times daily.   apixaban 5 MG Tabs tablet Commonly known as: Eliquis Take 1 tablet (5 mg total) by mouth 2 (two) times daily.   CALCIUM-VITAMIN D PO Take 1 tablet by mouth daily.   ferrous sulfate 325 (65 FE) MG tablet Take 325 mg by mouth daily with breakfast.   folic acid 1 MG tablet Commonly known as: FOLVITE Take 1 mg by mouth daily.   ibuprofen 200 MG tablet Commonly known as: ADVIL Take 400 mg by mouth daily as needed (hip pain.).   metoprolol succinate 25 MG 24 hr  tablet Commonly known as: Toprol XL Take 1 tablet (25 mg total) by mouth daily.   multivitamin with minerals Tabs tablet Take 1 tablet by mouth daily.   pomalidomide 1 MG capsule Commonly known as: Pomalyst TAKE 1 CAPSULE BY MOUTH  DAILY FOR 21 DAYS ON, THEN  7 DAYS OFF.       Allergies: No Known Allergies  Past Medical History, Surgical history, Social history, and Family History were reviewed and updated.  Review of Systems: Review of Systems  Constitutional: Negative.   HENT: Negative.   Eyes: Negative.   Respiratory: Negative.   Cardiovascular: Positive for palpitations.  Gastrointestinal: Negative.   Genitourinary: Negative.   Musculoskeletal: Positive for joint pain.  Skin: Negative.   Neurological: Negative.   Endo/Heme/Allergies:  Negative.   Psychiatric/Behavioral: Negative.      Physical Exam:  height is 5\' 8"  (1.727 m) and weight is 154 lb (69.9 kg). His temporal temperature is 97.3 F (36.3 C) (abnormal). His blood pressure is 99/55 (abnormal) and his pulse is 56 (abnormal). His respiration is 18 and oxygen saturation is 98%.   Wt Readings from Last 3 Encounters:  05/13/20 154 lb (69.9 kg)  05/13/20 154 lb (69.9 kg)  04/15/20 159 lb 1.3 oz (72.2 kg)    Physical Exam Vitals reviewed.  HENT:     Head: Normocephalic and atraumatic.  Eyes:     Pupils: Pupils are equal, round, and reactive to light.  Cardiovascular:     Rate and Rhythm: Normal rate and regular rhythm.     Heart sounds: Normal heart sounds.  Pulmonary:     Effort: Pulmonary effort is normal.     Breath sounds: Normal breath sounds.  Abdominal:     General: Bowel sounds are normal.     Palpations: Abdomen is soft.  Musculoskeletal:        General: No tenderness or deformity. Normal range of motion.     Cervical back: Normal range of motion.  Lymphadenopathy:     Cervical: No cervical adenopathy.  Skin:    General: Skin is warm and dry.     Findings: No erythema or rash.  Neurological:     Mental Status: He is alert and oriented to person, place, and time.  Psychiatric:        Behavior: Behavior normal.        Thought Content: Thought content normal.        Judgment: Judgment normal.      Lab Results  Component Value Date   WBC 3.5 (L) 05/13/2020   HGB 13.4 05/13/2020   HCT 38.6 (L) 05/13/2020   MCV 91.3 05/13/2020   PLT 88 (L) 05/13/2020   No results found for: FERRITIN, IRON, TIBC, UIBC, IRONPCTSAT Lab Results  Component Value Date   RBC 4.23 05/13/2020   Lab Results  Component Value Date   KPAFRELGTCHN 17.9 04/15/2020   LAMBDASER 12.4 04/15/2020   KAPLAMBRATIO 1.44 04/15/2020   Lab Results  Component Value Date   IGGSERUM 419 (L) 04/15/2020   IGA 11 (L) 04/15/2020   IGMSERUM 28 04/15/2020   Lab Results    Component Value Date   TOTALPROTELP 5.5 (L) 04/15/2020     Chemistry      Component Value Date/Time   NA 139 05/13/2020 1028   NA 137 04/16/2020 0846   K 4.1 05/13/2020 1028   CL 105 05/13/2020 1028   CO2 26 05/13/2020 1028   BUN 31 (H) 05/13/2020 1028   BUN 19 04/16/2020  8412   CREATININE 0.86 05/13/2020 1028      Component Value Date/Time   CALCIUM 9.5 05/13/2020 1028   ALKPHOS 52 05/13/2020 1028   AST 19 05/13/2020 1028   ALT 18 05/13/2020 1028   BILITOT 0.8 05/13/2020 1028       Impression and Plan: [name] is a very pleasant 64 yo caucasian gentleman with 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 and DS.   He is s/p autologous stem cell transplant at [facility] 11/202.   He is currently on maintenance Daratumumab and Pomalyst and tolerating these nicely.   We will proceed with treatment today he revealed the platelet count is 88,000.  I am really not too worried about this.  I told him that if he needed surgery for his joints, I do not see a problem with him having this.  I think surgery would certainly help his quality of life and he would definitely be more active.  We will continue to have him come back monthly.    Volanda Napoleon, MD 6/24/202111:37 AM

## 2020-05-13 NOTE — Progress Notes (Signed)
OK to treat with today's lab values per Dr. Ennever. 

## 2020-05-14 ENCOUNTER — Other Ambulatory Visit: Payer: Self-pay | Admitting: *Deleted

## 2020-05-14 ENCOUNTER — Other Ambulatory Visit: Payer: Self-pay | Admitting: Hematology & Oncology

## 2020-05-14 DIAGNOSIS — C9000 Multiple myeloma not having achieved remission: Secondary | ICD-10-CM

## 2020-05-14 DIAGNOSIS — C9001 Multiple myeloma in remission: Secondary | ICD-10-CM

## 2020-05-14 LAB — UPEP/UIFE/LIGHT CHAINS/TP, 24-HR UR
% BETA, Urine: 18.5 %
ALPHA 1 URINE: 3.5 %
Albumin, U: 33.5 %
Alpha 2, Urine: 20.4 %
Free Kappa Lt Chains,Ur: 16.58 mg/L (ref 0.63–113.79)
Free Kappa/Lambda Ratio: 7.3 (ref 1.03–31.76)
Free Lambda Lt Chains,Ur: 2.27 mg/L (ref 0.47–11.77)
GAMMA GLOBULIN URINE: 24.1 %
Total Protein, Urine-Ur/day: 68 mg/24 hr (ref 30–150)
Total Protein, Urine: 6.8 mg/dL
Total Volume: 1000

## 2020-05-14 LAB — KAPPA/LAMBDA LIGHT CHAINS
Kappa free light chain: 18.5 mg/L (ref 3.3–19.4)
Kappa, lambda light chain ratio: 1.27 (ref 0.26–1.65)
Lambda free light chains: 14.6 mg/L (ref 5.7–26.3)

## 2020-05-14 MED ORDER — POMALIDOMIDE 1 MG PO CAPS: ORAL_CAPSULE | ORAL | 0 refills | Status: DC

## 2020-05-17 LAB — MULTIPLE MYELOMA PANEL, SERUM
Albumin SerPl Elph-Mcnc: 3.7 g/dL (ref 2.9–4.4)
Albumin/Glob SerPl: 1.7 (ref 0.7–1.7)
Alpha 1: 0.3 g/dL (ref 0.0–0.4)
Alpha2 Glob SerPl Elph-Mcnc: 0.6 g/dL (ref 0.4–1.0)
B-Globulin SerPl Elph-Mcnc: 0.8 g/dL (ref 0.7–1.3)
Gamma Glob SerPl Elph-Mcnc: 0.4 g/dL (ref 0.4–1.8)
Globulin, Total: 2.2 g/dL (ref 2.2–3.9)
IgA: 12 mg/dL — ABNORMAL LOW (ref 61–437)
IgG (Immunoglobin G), Serum: 429 mg/dL — ABNORMAL LOW (ref 603–1613)
IgM (Immunoglobulin M), Srm: 28 mg/dL (ref 20–172)
Total Protein ELP: 5.9 g/dL — ABNORMAL LOW (ref 6.0–8.5)

## 2020-06-03 ENCOUNTER — Other Ambulatory Visit: Payer: Self-pay | Admitting: *Deleted

## 2020-06-03 DIAGNOSIS — C9000 Multiple myeloma not having achieved remission: Secondary | ICD-10-CM

## 2020-06-03 DIAGNOSIS — C9001 Multiple myeloma in remission: Secondary | ICD-10-CM

## 2020-06-03 MED ORDER — POMALIDOMIDE 1 MG PO CAPS: ORAL_CAPSULE | ORAL | 0 refills | Status: DC

## 2020-06-09 ENCOUNTER — Ambulatory Visit: Payer: 59 | Admitting: Cardiology

## 2020-06-10 ENCOUNTER — Other Ambulatory Visit: Payer: Self-pay

## 2020-06-10 ENCOUNTER — Other Ambulatory Visit: Payer: Self-pay | Admitting: Family

## 2020-06-10 ENCOUNTER — Inpatient Hospital Stay: Payer: 59 | Attending: Hematology

## 2020-06-10 ENCOUNTER — Encounter: Payer: Self-pay | Admitting: Hematology & Oncology

## 2020-06-10 ENCOUNTER — Inpatient Hospital Stay (HOSPITAL_BASED_OUTPATIENT_CLINIC_OR_DEPARTMENT_OTHER): Payer: 59 | Admitting: Hematology & Oncology

## 2020-06-10 ENCOUNTER — Inpatient Hospital Stay: Payer: 59

## 2020-06-10 VITALS — BP 110/60 | HR 58 | Temp 98.0°F | Resp 20 | Wt 148.0 lb

## 2020-06-10 DIAGNOSIS — Z7189 Other specified counseling: Secondary | ICD-10-CM

## 2020-06-10 DIAGNOSIS — Z5112 Encounter for antineoplastic immunotherapy: Secondary | ICD-10-CM | POA: Insufficient documentation

## 2020-06-10 DIAGNOSIS — C9000 Multiple myeloma not having achieved remission: Secondary | ICD-10-CM | POA: Diagnosis present

## 2020-06-10 DIAGNOSIS — C9001 Multiple myeloma in remission: Secondary | ICD-10-CM

## 2020-06-10 LAB — CMP (CANCER CENTER ONLY)
ALT: 18 U/L (ref 0–44)
AST: 20 U/L (ref 15–41)
Albumin: 4.5 g/dL (ref 3.5–5.0)
Alkaline Phosphatase: 48 U/L (ref 38–126)
Anion gap: 6 (ref 5–15)
BUN: 27 mg/dL — ABNORMAL HIGH (ref 8–23)
CO2: 30 mmol/L (ref 22–32)
Calcium: 10 mg/dL (ref 8.9–10.3)
Chloride: 106 mmol/L (ref 98–111)
Creatinine: 0.84 mg/dL (ref 0.61–1.24)
GFR, Est AFR Am: >60 mL/min (ref >60)
GFR, Estimated: >60 mL/min (ref >60)
Glucose, Bld: 82 mg/dL (ref 70–99)
Potassium: 4.2 mmol/L (ref 3.5–5.1)
Sodium: 142 mmol/L (ref 135–145)
Total Bilirubin: 0.7 mg/dL (ref 0.3–1.2)
Total Protein: 6.2 g/dL — ABNORMAL LOW (ref 6.5–8.1)

## 2020-06-10 LAB — CBC WITH DIFFERENTIAL (CANCER CENTER ONLY)
Abs Immature Granulocytes: 0 10*3/uL (ref 0.00–0.07)
Basophils Absolute: 0 10*3/uL (ref 0.0–0.1)
Basophils Relative: 2 %
Eosinophils Absolute: 0.1 10*3/uL (ref 0.0–0.5)
Eosinophils Relative: 2 %
HCT: 36.7 % — ABNORMAL LOW (ref 39.0–52.0)
Hemoglobin: 12.8 g/dL — ABNORMAL LOW (ref 13.0–17.0)
Immature Granulocytes: 0 %
Lymphocytes Relative: 28 %
Lymphs Abs: 0.6 10*3/uL — ABNORMAL LOW (ref 0.7–4.0)
MCH: 32.7 pg (ref 26.0–34.0)
MCHC: 34.9 g/dL (ref 30.0–36.0)
MCV: 93.6 fL (ref 80.0–100.0)
Monocytes Absolute: 0.5 10*3/uL (ref 0.1–1.0)
Monocytes Relative: 26 %
Neutro Abs: 0.9 10*3/uL — ABNORMAL LOW (ref 1.7–7.7)
Neutrophils Relative %: 42 %
Platelet Count: 80 10*3/uL — ABNORMAL LOW (ref 150–400)
RBC: 3.92 MIL/uL — ABNORMAL LOW (ref 4.22–5.81)
RDW: 14.2 % (ref 11.5–15.5)
WBC Count: 2.1 10*3/uL — ABNORMAL LOW (ref 4.0–10.5)
nRBC: 0 % (ref 0.0–0.2)

## 2020-06-10 MED ORDER — DEXAMETHASONE 4 MG PO TABS
20.0000 mg | ORAL_TABLET | Freq: Once | ORAL | Status: AC
  Administered 2020-06-10: 20 mg via ORAL

## 2020-06-10 MED ORDER — SODIUM CHLORIDE 0.9 % IV SOLN
Freq: Once | INTRAVENOUS | Status: AC
  Filled 2020-06-10: qty 250

## 2020-06-10 MED ORDER — ZOLEDRONIC ACID 4 MG/100ML IV SOLN
4.0000 mg | Freq: Once | INTRAVENOUS | Status: AC
  Administered 2020-06-10: 4 mg via INTRAVENOUS
  Filled 2020-06-10: qty 100

## 2020-06-10 MED ORDER — DARATUMUMAB-HYALURONIDASE-FIHJ 1800-30000 MG-UT/15ML ~~LOC~~ SOLN
1800.0000 mg | Freq: Once | SUBCUTANEOUS | Status: AC
  Administered 2020-06-10: 1800 mg via SUBCUTANEOUS
  Filled 2020-06-10: qty 15

## 2020-06-10 MED ORDER — DIPHENHYDRAMINE HCL 25 MG PO CAPS
ORAL_CAPSULE | ORAL | Status: AC
  Filled 2020-06-10: qty 2

## 2020-06-10 MED ORDER — ACETAMINOPHEN 325 MG PO TABS
650.0000 mg | ORAL_TABLET | Freq: Once | ORAL | Status: AC
  Administered 2020-06-10: 650 mg via ORAL

## 2020-06-10 MED ORDER — HEPARIN SOD (PORK) LOCK FLUSH 100 UNIT/ML IV SOLN
500.0000 [IU] | Freq: Once | INTRAVENOUS | Status: AC | PRN
  Administered 2020-06-10: 500 [IU]
  Filled 2020-06-10: qty 5

## 2020-06-10 MED ORDER — DIPHENHYDRAMINE HCL 25 MG PO CAPS
50.0000 mg | ORAL_CAPSULE | Freq: Once | ORAL | Status: AC
  Administered 2020-06-10: 50 mg via ORAL

## 2020-06-10 MED ORDER — ACETAMINOPHEN 325 MG PO TABS
ORAL_TABLET | ORAL | Status: AC
  Filled 2020-06-10: qty 2

## 2020-06-10 MED ORDER — SODIUM CHLORIDE 0.9% FLUSH
10.0000 mL | Freq: Once | INTRAVENOUS | Status: AC | PRN
  Administered 2020-06-10: 10 mL
  Filled 2020-06-10: qty 10

## 2020-06-10 MED ORDER — DEXAMETHASONE 4 MG PO TABS
ORAL_TABLET | ORAL | Status: AC
  Filled 2020-06-10: qty 5

## 2020-06-10 NOTE — Progress Notes (Signed)
Hematology and Oncology Follow Up Visit  Danny Juarez [id number] [birth year] 64 y.o. 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 Norma Fredrickson   Current Therapy: Maintenance Daratumumab (monthly) with Pomalidomide 72m (3 weeks on/1 week off) Zometa every 3 months; resumed in 12/2019 post-transplant, plan for 2 years  -- next dose 06/2020   Interim History:  [name] here today for follow-up and treatment.  He is doing quite well.  He has lost a little bit of weight.  He says that his appetite is doing okay.  He is is doing okay on the pomalidomide.  He will have his week break next week.  Only last saw him back in June, there is no monoclonal spike in his blood.  His IgG level was 430 mg/dL.  The lambda light chain was 1.5 mg/dL.  He does have a little bit of stiffness in his joints.  There is been no fever.  He has had no cough.  There has been no change in bowel or bladder habits.  He has had no bleeding.  He does have some issues with his knees and with his right hip.  He is trying to avoid surgery.  He does have atrial flutter.  He is on Eliquis for this.  I think he may be getting off this soon.  Cardiology is managing this.  Currently, his performance status is ECOG 1.  Medications:  Allergies as of 06/10/2020   No Known Allergies     Medication List       Accurate as of June 10, 2020  1:05 PM. If you have any questions, ask your nurse or doctor.        STOP taking these medications   apixaban 5 MG Tabs tablet Commonly known  as: Eliquis Stopped by: PVolanda Napoleon MD   metoprolol succinate 25 MG 24 hr tablet Commonly known as: Toprol XL Stopped by: PVolanda Napoleon MD     TAKE these medications   acyclovir 800 MG tablet Commonly known as: ZOVIRAX Take 800 mg by mouth 2 (two) times daily.   CALCIUM-VITAMIN D PO Take 1 tablet by mouth daily.   ferrous sulfate 325 (65 FE) MG tablet Take 325 mg by mouth daily with breakfast.   folic acid 1 MG tablet Commonly known as: FOLVITE Take 1 mg by mouth daily.   ibuprofen 200 MG tablet Commonly known as: ADVIL Take 400 mg by mouth daily as needed (hip pain.).   multivitamin with minerals Tabs tablet Take 1 tablet by mouth daily.   pomalidomide 1 MG capsule Commonly known as: Pomalyst TAKE 1 CAPSULE BY MOUTH  DAILY FOR 21 DAYS, THEN 7  DAYS OFF [id number]      Allergies: No Known Allergies  Past Medical History, Surgical history, Social history, and Family History were reviewed and updated.  Review of Systems: Review of Systems  Constitutional: Negative.   HENT: Negative.   Eyes: Negative.   Respiratory: Negative.   Cardiovascular: Positive for palpitations.  Gastrointestinal: Negative.   Genitourinary: Negative.  Musculoskeletal: Positive for joint pain.  Skin: Negative.   Neurological: Negative.   Endo/Heme/Allergies: Negative.   Psychiatric/Behavioral: Negative.      Physical Exam:  weight is 148 lb (67.1 kg). His oral temperature is 98 F (36.7 C). His blood pressure is 110/60 (abnormal) and his pulse is 58. His respiration is 20 and oxygen saturation is 99%.   Wt Readings from Last 3 Encounters:  06/10/20 148 lb (67.1 kg)  05/13/20 154 lb (69.9 kg)  05/13/20 154 lb (69.9 kg)    Physical Exam Vitals reviewed.  HENT:     Head: Normocephalic and atraumatic.  Eyes:     Pupils: Pupils are equal, round, and reactive to light.  Cardiovascular:     Rate and Rhythm: Normal rate and regular rhythm.     Heart sounds: Normal  heart sounds.  Pulmonary:     Effort: Pulmonary effort is normal.     Breath sounds: Normal breath sounds.  Abdominal:     General: Bowel sounds are normal.     Palpations: Abdomen is soft.  Musculoskeletal:        General: No tenderness or deformity. Normal range of motion.     Cervical back: Normal range of motion.  Lymphadenopathy:     Cervical: No cervical adenopathy.  Skin:    General: Skin is warm and dry.     Findings: No erythema or rash.  Neurological:     Mental Status: He is alert and oriented to person, place, and time.  Psychiatric:        Behavior: Behavior normal.        Thought Content: Thought content normal.        Judgment: Judgment normal.      Lab Results  Component Value Date   WBC 2.1 (L) 06/10/2020   HGB 12.8 (L) 06/10/2020   HCT 36.7 (L) 06/10/2020   MCV 93.6 06/10/2020   PLT 80 (L) 06/10/2020   No results found for: FERRITIN, IRON, TIBC, UIBC, IRONPCTSAT Lab Results  Component Value Date   RBC 3.92 (L) 06/10/2020   Lab Results  Component Value Date   KPAFRELGTCHN 18.5 05/13/2020   LAMBDASER 14.6 05/13/2020   KAPLAMBRATIO 1.27 05/13/2020   Lab Results  Component Value Date   IGGSERUM 429 (L) 05/13/2020   IGA 12 (L) 05/13/2020   IGMSERUM 28 05/13/2020   Lab Results  Component Value Date   TOTALPROTELP 5.9 (L) 05/13/2020     Chemistry      Component Value Date/Time   NA 142 06/10/2020 1159   NA 137 04/16/2020 0846   K 4.2 06/10/2020 1159   CL 106 06/10/2020 1159   CO2 30 06/10/2020 1159   BUN 27 (H) 06/10/2020 1159   BUN 19 04/16/2020 0846   CREATININE 0.84 06/10/2020 1159      Component Value Date/Time   CALCIUM 10.0 06/10/2020 1159   ALKPHOS 48 06/10/2020 1159   AST 20 06/10/2020 1159   ALT 18 06/10/2020 1159   BILITOT 0.7 06/10/2020 1159       Impression and Plan: Danny Juarez is a very pleasant 64 yo caucasian gentleman with 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 and DS.   He is s/p autologous stem cell  transplant at [facility] 11/202.   He is currently on maintenance Daratumumab and Pomalyst and tolerating these nicely.   Even though his platelet count is low, I do not worry about this.  The daratumumab really should not affect this.  The  platelet count is low secondary to the pomalidomide.  I looked at his blood smear under the microscope.  I do not see any rouleaux formation.   We will see about getting his Port-A-Cath taken out.  We really are not using this any longer.  He would like to have it out.  We will continue to have him come back monthly.    Volanda Napoleon, MD 7/22/20211:05 PM

## 2020-06-10 NOTE — Progress Notes (Signed)
Ok to treat with low platelets and ANC per Dr Marin Olp. dph

## 2020-06-10 NOTE — Patient Instructions (Signed)
Zoledronic Acid injection (Hypercalcemia, Oncology) What is this medicine? ZOLEDRONIC ACID (ZOE le dron ik AS id) lowers the amount of calcium loss from bone. It is used to treat too much calcium in your blood from cancer. It is also used to prevent complications of cancer that has spread to the bone. This medicine may be used for other purposes; ask your health care provider or pharmacist if you have questions. COMMON BRAND NAME(S): Zometa What should I tell my health care provider before I take this medicine? They need to know if you have any of these conditions:  aspirin-sensitive asthma  cancer, especially if you are receiving medicines used to treat cancer  dental disease or wear dentures  infection  kidney disease  receiving corticosteroids like dexamethasone or prednisone  an unusual or allergic reaction to zoledronic acid, other medicines, foods, dyes, or preservatives  pregnant or trying to get pregnant  breast-feeding How should I use this medicine? This medicine is for infusion into a vein. It is given by a health care professional in a hospital or clinic setting. Talk to your pediatrician regarding the use of this medicine in children. Special care may be needed. Overdosage: If you think you have taken too much of this medicine contact a poison control center or emergency room at once. NOTE: This medicine is only for you. Do not share this medicine with others. What if I miss a dose? It is important not to miss your dose. Call your doctor or health care professional if you are unable to keep an appointment. What may interact with this medicine?  certain antibiotics given by injection  NSAIDs, medicines for pain and inflammation, like ibuprofen or naproxen  some diuretics like bumetanide, furosemide  teriparatide  thalidomide This list may not describe all possible interactions. Give your health care provider a list of all the medicines, herbs, non-prescription  drugs, or dietary supplements you use. Also tell them if you smoke, drink alcohol, or use illegal drugs. Some items may interact with your medicine. What should I watch for while using this medicine? Visit your doctor or health care professional for regular checkups. It may be some time before you see the benefit from this medicine. Do not stop taking your medicine unless your doctor tells you to. Your doctor may order blood tests or other tests to see how you are doing. Women should inform their doctor if they wish to become pregnant or think they might be pregnant. There is a potential for serious side effects to an unborn child. Talk to your health care professional or pharmacist for more information. You should make sure that you get enough calcium and vitamin D while you are taking this medicine. Discuss the foods you eat and the vitamins you take with your health care professional. Some people who take this medicine have severe bone, joint, and/or muscle pain. This medicine may also increase your risk for jaw problems or a broken thigh bone. Tell your doctor right away if you have severe pain in your jaw, bones, joints, or muscles. Tell your doctor if you have any pain that does not go away or that gets worse. Tell your dentist and dental surgeon that you are taking this medicine. You should not have major dental surgery while on this medicine. See your dentist to have a dental exam and fix any dental problems before starting this medicine. Take good care of your teeth while on this medicine. Make sure you see your dentist for regular follow-up   appointments. What side effects may I notice from receiving this medicine? Side effects that you should report to your doctor or health care professional as soon as possible:  allergic reactions like skin rash, itching or hives, swelling of the face, lips, or tongue  anxiety, confusion, or depression  breathing problems  changes in vision  eye  pain  feeling faint or lightheaded, falls  jaw pain, especially after dental work  mouth sores  muscle cramps, stiffness, or weakness  redness, blistering, peeling or loosening of the skin, including inside the mouth  trouble passing urine or change in the amount of urine Side effects that usually do not require medical attention (report to your doctor or health care professional if they continue or are bothersome):  bone, joint, or muscle pain  constipation  diarrhea  fever  hair loss  irritation at site where injected  loss of appetite  nausea, vomiting  stomach upset  trouble sleeping  trouble swallowing  weak or tired This list may not describe all possible side effects. Call your doctor for medical advice about side effects. You may report side effects to FDA at 1-800-FDA-1088. Where should I keep my medicine? This drug is given in a hospital or clinic and will not be stored at home. NOTE: This sheet is a summary. It may not cover all possible information. If you have questions about this medicine, talk to your doctor, pharmacist, or health care provider.  2020 Elsevier/Gold Standard (2014-04-04 14:19:39) Daratumumab injection What is this medicine? DARATUMUMAB (dar a toom ue mab) is a monoclonal antibody. It is used to treat multiple myeloma. This medicine may be used for other purposes; ask your health care provider or pharmacist if you have questions. COMMON BRAND NAME(S): DARZALEX What should I tell my health care provider before I take this medicine? They need to know if you have any of these conditions:  infection (especially a virus infection such as chickenpox, herpes, or hepatitis B virus)  lung or breathing disease  an unusual or allergic reaction to daratumumab, other medicines, foods, dyes, or preservatives  pregnant or trying to get pregnant  breast-feeding How should I use this medicine? This medicine is for infusion into a vein. It is  given by a health care professional in a hospital or clinic setting. Talk to your pediatrician regarding the use of this medicine in children. Special care may be needed. Overdosage: If you think you have taken too much of this medicine contact a poison control center or emergency room at once. NOTE: This medicine is only for you. Do not share this medicine with others. What if I miss a dose? Keep appointments for follow-up doses as directed. It is important not to miss your dose. Call your doctor or health care professional if you are unable to keep an appointment. What may interact with this medicine? Interactions have not been studied. This list may not describe all possible interactions. Give your health care provider a list of all the medicines, herbs, non-prescription drugs, or dietary supplements you use. Also tell them if you smoke, drink alcohol, or use illegal drugs. Some items may interact with your medicine. What should I watch for while using this medicine? This drug may make you feel generally unwell. Report any side effects. Continue your course of treatment even though you feel ill unless your doctor tells you to stop. This medicine can cause serious allergic reactions. To reduce your risk you may need to take medicine before treatment with this  medicine. Take your medicine as directed. This medicine can affect the results of blood tests to match your blood type. These changes can last for up to 6 months after the final dose. Your healthcare provider will do blood tests to match your blood type before you start treatment. Tell all of your healthcare providers that you are being treated with this medicine before receiving a blood transfusion. This medicine can affect the results of some tests used to determine treatment response; extra tests may be needed to evaluate response. Do not become pregnant while taking this medicine or for 3 months after stopping it. Women should inform their  doctor if they wish to become pregnant or think they might be pregnant. There is a potential for serious side effects to an unborn child. Talk to your health care professional or pharmacist for more information. What side effects may I notice from receiving this medicine? Side effects that you should report to your doctor or health care professional as soon as possible:  allergic reactions like skin rash, itching or hives, swelling of the face, lips, or tongue  breathing problems  chills  cough  dizziness  feeling faint or lightheaded  headache  low blood counts - this medicine may decrease the number of white blood cells, red blood cells and platelets. You may be at increased risk for infections and bleeding.  nausea, vomiting  shortness of breath  signs of decreased platelets or bleeding - bruising, pinpoint red spots on the skin, black, tarry stools, blood in the urine  signs of decreased red blood cells - unusually weak or tired, feeling faint or lightheaded, falls  signs of infection - fever or chills, cough, sore throat, pain or difficulty passing urine  signs and symptoms of liver injury like dark yellow or brown urine; general ill feeling or flu-like symptoms; light-colored stools; loss of appetite; right upper belly pain; unusually weak or tired; yellowing of the eyes or skin Side effects that usually do not require medical attention (report to your doctor or health care professional if they continue or are bothersome):  back pain  constipation  diarrhea  joint pain  muscle cramps  pain, tingling, numbness in the hands or feet  swelling of the ankles, feet, hands  tiredness  trouble sleeping This list may not describe all possible side effects. Call your doctor for medical advice about side effects. You may report side effects to FDA at 1-800-FDA-1088. Where should I keep my medicine? This drug is given in a hospital or clinic and will not be stored at  home. NOTE: This sheet is a summary. It may not cover all possible information. If you have questions about this medicine, talk to your doctor, pharmacist, or health care provider.  2020 Elsevier/Gold Standard (2019-07-15 18:10:54)

## 2020-06-11 LAB — LACTATE DEHYDROGENASE: LDH: 167 U/L (ref 98–192)

## 2020-06-11 LAB — IGG, IGA, IGM
IgA: 14 mg/dL — ABNORMAL LOW (ref 61–437)
IgG (Immunoglobin G), Serum: 452 mg/dL — ABNORMAL LOW (ref 603–1613)
IgM (Immunoglobulin M), Srm: 30 mg/dL (ref 20–172)

## 2020-06-11 LAB — KAPPA/LAMBDA LIGHT CHAINS
Kappa free light chain: 20.5 mg/L — ABNORMAL HIGH (ref 3.3–19.4)
Kappa, lambda light chain ratio: 1.09 (ref 0.26–1.65)
Lambda free light chains: 18.8 mg/L (ref 5.7–26.3)

## 2020-06-14 LAB — PROTEIN ELECTROPHORESIS, SERUM, WITH REFLEX
A/G Ratio: 1.8 — ABNORMAL HIGH (ref 0.7–1.7)
Albumin ELP: 3.8 g/dL (ref 2.9–4.4)
Alpha-1-Globulin: 0.3 g/dL (ref 0.0–0.4)
Alpha-2-Globulin: 0.6 g/dL (ref 0.4–1.0)
Beta Globulin: 0.8 g/dL (ref 0.7–1.3)
Gamma Globulin: 0.4 g/dL (ref 0.4–1.8)
Globulin, Total: 2.1 g/dL — ABNORMAL LOW (ref 2.2–3.9)
Total Protein ELP: 5.9 g/dL — ABNORMAL LOW (ref 6.0–8.5)

## 2020-06-18 ENCOUNTER — Ambulatory Visit: Payer: 59

## 2020-06-18 ENCOUNTER — Other Ambulatory Visit: Payer: 59

## 2020-06-25 ENCOUNTER — Other Ambulatory Visit: Payer: Self-pay | Admitting: Radiology

## 2020-06-28 ENCOUNTER — Other Ambulatory Visit: Payer: Self-pay | Admitting: Radiology

## 2020-06-29 ENCOUNTER — Other Ambulatory Visit (HOSPITAL_COMMUNITY): Payer: Self-pay | Admitting: Interventional Radiology

## 2020-06-29 ENCOUNTER — Other Ambulatory Visit: Payer: Self-pay

## 2020-06-29 ENCOUNTER — Encounter (HOSPITAL_COMMUNITY): Payer: Self-pay

## 2020-06-29 ENCOUNTER — Ambulatory Visit (HOSPITAL_COMMUNITY)
Admission: RE | Admit: 2020-06-29 | Discharge: 2020-06-29 | Disposition: A | Discharge status: Home / Self Care | Payer: 59 | Source: Ambulatory Visit | Attending: Hematology & Oncology | Admitting: Hematology & Oncology

## 2020-06-29 ENCOUNTER — Other Ambulatory Visit: Payer: Self-pay | Admitting: *Deleted

## 2020-06-29 DIAGNOSIS — C9000 Multiple myeloma not having achieved remission: Secondary | ICD-10-CM

## 2020-06-29 DIAGNOSIS — C9001 Multiple myeloma in remission: Secondary | ICD-10-CM | POA: Diagnosis not present

## 2020-06-29 DIAGNOSIS — Z452 Encounter for adjustment and management of vascular access device: Secondary | ICD-10-CM | POA: Insufficient documentation

## 2020-06-29 HISTORY — PX: IR REMOVAL TUN ACCESS W/ PORT W/O FL MOD SED: IMG2290

## 2020-06-29 LAB — CBC WITH DIFFERENTIAL/PLATELET
Abs Immature Granulocytes: 0 10*3/uL (ref 0.00–0.07)
Basophils Absolute: 0.1 10*3/uL (ref 0.0–0.1)
Basophils Relative: 3 %
Eosinophils Absolute: 0.1 10*3/uL (ref 0.0–0.5)
Eosinophils Relative: 4 %
HCT: 37.6 % — ABNORMAL LOW (ref 39.0–52.0)
Hemoglobin: 13.1 g/dL (ref 13.0–17.0)
Immature Granulocytes: 0 %
Lymphocytes Relative: 28 %
Lymphs Abs: 0.5 10*3/uL — ABNORMAL LOW (ref 0.7–4.0)
MCH: 33.1 pg (ref 26.0–34.0)
MCHC: 34.8 g/dL (ref 30.0–36.0)
MCV: 94.9 fL (ref 80.0–100.0)
Monocytes Absolute: 0.4 10*3/uL (ref 0.1–1.0)
Monocytes Relative: 22 %
Neutro Abs: 0.7 10*3/uL — ABNORMAL LOW (ref 1.7–7.7)
Neutrophils Relative %: 43 %
Platelets: 121 10*3/uL — ABNORMAL LOW (ref 150–400)
RBC: 3.96 MIL/uL — ABNORMAL LOW (ref 4.22–5.81)
RDW: 14.1 % (ref 11.5–15.5)
WBC: 1.7 10*3/uL — ABNORMAL LOW (ref 4.0–10.5)
nRBC: 0 % (ref 0.0–0.2)

## 2020-06-29 LAB — PROTIME-INR
INR: 1.1 (ref 0.8–1.2)
Prothrombin Time: 13.5 seconds (ref 11.4–15.2)

## 2020-06-29 MED ORDER — LIDOCAINE HCL 1 % IJ SOLN
INTRAMUSCULAR | Status: AC
  Administered 2020-06-29: 10 mL
  Filled 2020-06-29: qty 20

## 2020-06-29 MED ORDER — CEFAZOLIN SODIUM-DEXTROSE 2-4 GM/100ML-% IV SOLN: 2.0000 g | INTRAVENOUS | Status: AC

## 2020-06-29 MED ORDER — POMALIDOMIDE 1 MG PO CAPS: ORAL_CAPSULE | ORAL | 0 refills | Status: DC

## 2020-06-29 MED ORDER — LIDOCAINE HCL 1 % IJ SOLN: 10.0000 mL | Freq: Once | INTRAMUSCULAR | Status: AC

## 2020-06-29 MED ORDER — CEFAZOLIN SODIUM-DEXTROSE 2-4 GM/100ML-% IV SOLN
INTRAVENOUS | Status: AC
  Administered 2020-06-29: 2 g via INTRAVENOUS
  Filled 2020-06-29: qty 100

## 2020-06-29 MED ORDER — SODIUM CHLORIDE 0.9 % IV SOLN: INTRAVENOUS | Status: DC

## 2020-06-29 NOTE — Progress Notes (Signed)
Patient ID: Danny Juarez, male   DOB: 1956-04-08, 64 y.o.   MRN: [id number] Patient presents to IR department today for Port-A-Cath removal.  Port-A-Cath was placed by our service on 07/25/2019.  He has a history of multiple myeloma and is no longer using Port-A-Cath.  Details/risks of procedure, including but not limited to, internal bleeding, infection, injury to adjacent structures discussed with patient with his understanding and consent.  He does not wish to receive IV conscious sedation for the procedure.

## 2020-06-29 NOTE — Discharge Instructions (Signed)
Implanted Port Removal, Care After This sheet gives you information about how to care for yourself after your procedure. Your health care provider may also give you more specific instructions. If you have problems or questions, contact your health care provider. What can I expect after the procedure? After the procedure, it is common to have:  Soreness or pain near your incision.  Some swelling or bruising near your incision. Follow these instructions at home: Medicines  Take over-the-counter and prescription medicines only as told by your health care provider.  If you were prescribed an antibiotic medicine, take it as told by your health care provider. Do not stop taking the antibiotic even if you start to feel better. Bathing  Do not take baths, swim, or use a hot tub until your health care provider approves. Ask your health care provider if you can take showers. You may only be allowed to take sponge baths. Incision care   Follow instructions from your health care provider about how to take care of your incision. Make sure you: ? Wash your hands with soap and water before you change your bandage (dressing). If soap and water are not available, use hand sanitizer. ? Change your dressing as told by your health care provider. ? Keep your dressing dry. ? Leave stitches (sutures), skin glue, or adhesive strips in place. These skin closures may need to stay in place for 2 weeks or longer. If adhesive strip edges start to loosen and curl up, you may trim the loose edges. Do not remove adhesive strips completely unless your health care provider tells you to do that.  Check your incision area every day for signs of infection. Check for: ? More redness, swelling, or pain. ? More fluid or blood. ? Warmth. ? Pus or a bad smell. Driving   Do not drive for 24 hours if you were given a medicine to help you relax (sedative) during your procedure.  If you did not receive a sedative, ask your  health care provider when it is safe to drive. Activity  Return to your normal activities as told by your health care provider. Ask your health care provider what activities are safe for you.  Do not lift anything that is heavier than 10 lb (4.5 kg), or the limit that you are told, until your health care provider says that it is safe.  Do not do activities that involve lifting your arms over your head. General instructions  Do not use any products that contain nicotine or tobacco, such as cigarettes and e-cigarettes. These can delay healing. If you need help quitting, ask your health care provider.  Keep all follow-up visits as told by your health care provider. This is important. Contact a health care provider if:  You have more redness, swelling, or pain around your incision.  You have more fluid or blood coming from your incision.  Your incision feels warm to the touch.  You have pus or a bad smell coming from your incision.  You have pain that is not relieved by your pain medicine. Get help right away if you have:  A fever or chills.  Chest pain.  Difficulty breathing. Summary  After the procedure, it is common to have pain, soreness, swelling, or bruising near your incision.  If you were prescribed an antibiotic medicine, take it as told by your health care provider. Do not stop taking the antibiotic even if you start to feel better.  Do not drive for 24 hours   if you were given a sedative during your procedure.  Return to your normal activities as told by your health care provider. Ask your health care provider what activities are safe for you. This information is not intended to replace advice given to you by your health care provider. Make sure you discuss any questions you have with your health care provider. Document Revised: 12/20/2017 Document Reviewed: 12/20/2017 Elsevier Patient Education  2020 Elsevier Inc.  

## 2020-06-29 NOTE — Procedures (Signed)
Interventional Radiology Procedure Note  Procedure: Port removal  Complications: None  Estimated Blood Loss: < 10 mL  Findings: Right chest port removed in entirety. Pocket closed.  Venetia Night. Kathlene Cote, M.D Pager:  [phone]

## 2020-07-07 ENCOUNTER — Telehealth: Payer: Self-pay | Admitting: *Deleted

## 2020-07-07 NOTE — Telephone Encounter (Signed)
Spoke with [employer] for PA for [employer]. Auth# valid from 07/07/2020-07/17/2021

## 2020-07-08 ENCOUNTER — Inpatient Hospital Stay: Payer: 59 | Attending: Hematology

## 2020-07-08 ENCOUNTER — Inpatient Hospital Stay: Payer: 59

## 2020-07-08 ENCOUNTER — Inpatient Hospital Stay (HOSPITAL_BASED_OUTPATIENT_CLINIC_OR_DEPARTMENT_OTHER): Payer: 59 | Admitting: Family

## 2020-07-08 ENCOUNTER — Other Ambulatory Visit: Payer: Self-pay

## 2020-07-08 ENCOUNTER — Encounter: Payer: Self-pay | Admitting: Family

## 2020-07-08 VITALS — BP 98/55 | HR 52 | Temp 98.0°F | Resp 18 | Ht 68.0 in | Wt 142.0 lb

## 2020-07-08 DIAGNOSIS — C9000 Multiple myeloma not having achieved remission: Secondary | ICD-10-CM

## 2020-07-08 DIAGNOSIS — Z5112 Encounter for antineoplastic immunotherapy: Secondary | ICD-10-CM | POA: Insufficient documentation

## 2020-07-08 DIAGNOSIS — Z7189 Other specified counseling: Secondary | ICD-10-CM

## 2020-07-08 DIAGNOSIS — C9001 Multiple myeloma in remission: Secondary | ICD-10-CM

## 2020-07-08 LAB — SAVE SMEAR(SSMR), FOR PROVIDER SLIDE REVIEW

## 2020-07-08 LAB — CMP (CANCER CENTER ONLY)
ALT: 20 U/L (ref 0–44)
AST: 22 U/L (ref 15–41)
Albumin: 4.3 g/dL (ref 3.5–5.0)
Alkaline Phosphatase: 52 U/L (ref 38–126)
Anion gap: 6 (ref 5–15)
BUN: 24 mg/dL — ABNORMAL HIGH (ref 8–23)
CO2: 30 mmol/L (ref 22–32)
Calcium: 9.9 mg/dL (ref 8.9–10.3)
Chloride: 104 mmol/L (ref 98–111)
Creatinine: 0.81 mg/dL (ref 0.61–1.24)
GFR, Est AFR Am: >60 mL/min (ref >60)
GFR, Estimated: >60 mL/min (ref >60)
Glucose, Bld: 93 mg/dL (ref 70–99)
Potassium: 4.7 mmol/L (ref 3.5–5.1)
Sodium: 140 mmol/L (ref 135–145)
Total Bilirubin: 0.8 mg/dL (ref 0.3–1.2)
Total Protein: 6 g/dL — ABNORMAL LOW (ref 6.5–8.1)

## 2020-07-08 LAB — CBC WITH DIFFERENTIAL (CANCER CENTER ONLY)
Abs Immature Granulocytes: 0 10*3/uL (ref 0.00–0.07)
Basophils Absolute: 0 10*3/uL (ref 0.0–0.1)
Basophils Relative: 2 %
Eosinophils Absolute: 0 10*3/uL (ref 0.0–0.5)
Eosinophils Relative: 2 %
HCT: 35.2 % — ABNORMAL LOW (ref 39.0–52.0)
Hemoglobin: 12.3 g/dL — ABNORMAL LOW (ref 13.0–17.0)
Immature Granulocytes: 0 %
Lymphocytes Relative: 28 %
Lymphs Abs: 0.6 10*3/uL — ABNORMAL LOW (ref 0.7–4.0)
MCH: 32.9 pg (ref 26.0–34.0)
MCHC: 34.9 g/dL (ref 30.0–36.0)
MCV: 94.1 fL (ref 80.0–100.0)
Monocytes Absolute: 0.5 10*3/uL (ref 0.1–1.0)
Monocytes Relative: 26 %
Neutro Abs: 0.8 10*3/uL — ABNORMAL LOW (ref 1.7–7.7)
Neutrophils Relative %: 42 %
Platelet Count: 78 10*3/uL — ABNORMAL LOW (ref 150–400)
RBC: 3.74 MIL/uL — ABNORMAL LOW (ref 4.22–5.81)
RDW: 13.6 % (ref 11.5–15.5)
WBC Count: 2 10*3/uL — ABNORMAL LOW (ref 4.0–10.5)
nRBC: 0 % (ref 0.0–0.2)

## 2020-07-08 LAB — LACTATE DEHYDROGENASE: LDH: 163 U/L (ref 98–192)

## 2020-07-08 MED ORDER — DEXAMETHASONE 4 MG PO TABS
20.0000 mg | ORAL_TABLET | Freq: Once | ORAL | Status: AC
  Administered 2020-07-08: 20 mg via ORAL

## 2020-07-08 MED ORDER — ACETAMINOPHEN 325 MG PO TABS
650.0000 mg | ORAL_TABLET | Freq: Once | ORAL | Status: AC
  Administered 2020-07-08: 650 mg via ORAL

## 2020-07-08 MED ORDER — DEXAMETHASONE 4 MG PO TABS
ORAL_TABLET | ORAL | Status: AC
  Filled 2020-07-08: qty 5

## 2020-07-08 MED ORDER — DARATUMUMAB-HYALURONIDASE-FIHJ 1800-30000 MG-UT/15ML ~~LOC~~ SOLN
1800.0000 mg | Freq: Once | SUBCUTANEOUS | Status: AC
  Administered 2020-07-08: 1800 mg via SUBCUTANEOUS
  Filled 2020-07-08: qty 15

## 2020-07-08 MED ORDER — DIPHENHYDRAMINE HCL 25 MG PO CAPS
50.0000 mg | ORAL_CAPSULE | Freq: Once | ORAL | Status: AC
  Administered 2020-07-08: 50 mg via ORAL

## 2020-07-08 NOTE — Progress Notes (Signed)
Ok to treat with Darzalex with ANC .8 and platelets of 78,000 per Laverna Peace NP.  Pomalyst stopped

## 2020-07-08 NOTE — Progress Notes (Signed)
Hematology and Oncology Follow Up Visit  Danny Juarez [id number] 1956/05/01 64 y.o. 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 Norma Fredrickson  Current Therapy: Maintenance Daratumumab (monthly) with Pomalidomide 5m (3 weeks on/1 week off) Zometa every 3 months; resumed in 12/2019 post-transplant, plan for 2 years  -- next dose 06/2020   Interim History:  [name] here today for follow-up and treatment. He is doing well but does note occasional fatigue.  He has been walking and riding his bike for exercise.  In July, no M-spike was was detected, IgG level was 452 mg/dL and lambda light chains 18.8 mg/L. His platelet count is down to 78 today from 121 at his last visit. Hgb 12.3 and WBC count 2.0.  He has not noted any episodes of blood loss. No bruising or petechiae.  No fever, chills, n/v, cough, rash, dizziness, SOB, chest pain, palpitations, abdominal pain or changes in bowel or bladder habits.  No swelling, tenderness, numbness or tingling in his extremities.  No falls or syncopal episodes to report. He ambulates with a cane for added support.  He states that he has maintained a good appetite and feels he is staying well hydrated. Weight was 148 lbs at his last visit and today is down to 142 lbs.   ECOG Performance Status: 1 - Symptomatic but completely ambulatory  Medications:  Allergies as of 07/08/2020   No Known Allergies     Medication List       Accurate as of July 08, 2020 11:38 AM. If you have any questions, ask your nurse or doctor.          acyclovir 800 MG tablet Commonly known as: ZOVIRAX Take 800 mg by mouth 2 (two) times daily.   CALCIUM-VITAMIN D PO Take 1 tablet by mouth daily.   ferrous sulfate 325 (65 FE) MG tablet Take 325 mg by mouth daily with breakfast.   folic acid 1 MG tablet Commonly known as: FOLVITE Take 1 mg by mouth daily.   ibuprofen 200 MG tablet Commonly known as: ADVIL Take 400 mg by mouth daily as needed (hip pain.).   multivitamin with minerals Tabs tablet Take 1 tablet by mouth daily.   pomalidomide 1 MG capsule Commonly known as: Pomalyst TAKE 1 CAPSULE BY MOUTH  DAILY FOR 21 DAYS, THEN 7  DAYS OFF AYBOF#[id number]      Allergies: No Known Allergies  Past Medical History, Surgical history, Social history, and Family History were reviewed and updated.  Review of Systems: All other 10 point review of systems is negative.   Physical Exam:  height is '5\' 8"'  (1.727 m) and weight is 142 lb (64.4 kg). His oral temperature is 98 F (36.7 C). His blood pressure is 98/55 (abnormal) and his pulse is 52 (abnormal). His respiration is 18 and oxygen saturation is 100%.   Wt Readings from Last 3 Encounters:  07/08/20 142 lb (64.4 kg)  06/29/20 155 lb (70.3 kg)  06/10/20 148 lb (67.1 kg)    Ocular: Sclerae unicteric, pupils equal, round and reactive to light Ear-nose-throat: Oropharynx  clear, dentition fair Lymphatic: No cervical or supraclavicular adenopathy Lungs no rales or rhonchi, good excursion bilaterally Heart regular rate and rhythm, no murmur appreciated Abd soft, nontender, positive bowel sounds, no liver or spleen tip palpated on exam, no fluid wave  MSK no focal spinal tenderness, no joint edema Neuro: non-focal, well-oriented, appropriate affect Breasts: Deferred   Lab Results  Component Value Date   WBC 2.0 (L) 07/08/2020   HGB 12.3 (L) 07/08/2020   HCT 35.2 (L) 07/08/2020   MCV 94.1 07/08/2020   PLT 78 (L) 07/08/2020   No results found for: FERRITIN, IRON,  TIBC, UIBC, IRONPCTSAT Lab Results  Component Value Date   RBC 3.74 (L) 07/08/2020   Lab Results  Component Value Date   KPAFRELGTCHN 20.5 (H) 06/10/2020   LAMBDASER 18.8 06/10/2020   KAPLAMBRATIO 1.09 06/10/2020   Lab Results  Component Value Date   IGGSERUM 452 (L) 06/10/2020   IGA 14 (L) 06/10/2020   IGMSERUM 30 06/10/2020   Lab Results  Component Value Date   TOTALPROTELP 5.9 (L) 06/10/2020   ALBUMINELP 3.8 06/10/2020   A1GS 0.3 06/10/2020   A2GS 0.6 06/10/2020   BETS 0.8 06/10/2020   GAMS 0.4 06/10/2020   MSPIKE Not Observed 06/10/2020     Chemistry      Component Value Date/Time   NA 140 07/08/2020 1053   NA 137 04/16/2020 0846   K 4.7 07/08/2020 1053   CL 104 07/08/2020 1053   CO2 30 07/08/2020 1053   BUN 24 (H) 07/08/2020 1053   BUN 19 04/16/2020 0846   CREATININE 0.81 07/08/2020 1053      Component Value Date/Time   CALCIUM 9.9 07/08/2020 1053   ALKPHOS 52 07/08/2020 1053   AST 22 07/08/2020 1053   ALT 20 07/08/2020 1053   BILITOT 0.8 07/08/2020 1053       Impression and Plan: Danny Juarez is a very pleasant 64 yo caucasian gentleman with 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 and DS.  He is s/p autologous stem cell transplant at [facility] 09/2019.  With this significant drop in his platelets we will have him hold his Pomalyst starting today for the next two weeks.  He did receive Daratumumab I discussed his weight going up and down with Dr. Marin Olp and for now we will just watch. The patient states that he is eating well.  We will plan to see him again in another month.   Laverna Peace, NP 8/19/202111:38 AM

## 2020-07-09 ENCOUNTER — Telehealth: Payer: Self-pay | Admitting: Hematology & Oncology

## 2020-07-09 LAB — IGG, IGA, IGM
IgA: 17 mg/dL — ABNORMAL LOW (ref 61–437)
IgG (Immunoglobin G), Serum: 430 mg/dL — ABNORMAL LOW (ref 603–1613)
IgM (Immunoglobulin M), Srm: 34 mg/dL (ref 20–172)

## 2020-07-09 LAB — KAPPA/LAMBDA LIGHT CHAINS
Kappa free light chain: 24.8 mg/L — ABNORMAL HIGH (ref 3.3–19.4)
Kappa, lambda light chain ratio: 1.18 (ref 0.26–1.65)
Lambda free light chains: 21.1 mg/L (ref 5.7–26.3)

## 2020-07-09 NOTE — Telephone Encounter (Signed)
Appointments scheduled calendar printed & mailed per 8/19 los 

## 2020-07-12 LAB — PROTEIN ELECTROPHORESIS, SERUM, WITH REFLEX
A/G Ratio: 1.9 — ABNORMAL HIGH (ref 0.7–1.7)
Albumin ELP: 3.7 g/dL (ref 2.9–4.4)
Alpha-1-Globulin: 0.3 g/dL (ref 0.0–0.4)
Alpha-2-Globulin: 0.6 g/dL (ref 0.4–1.0)
Beta Globulin: 0.8 g/dL (ref 0.7–1.3)
Gamma Globulin: 0.4 g/dL (ref 0.4–1.8)
Globulin, Total: 2 g/dL — ABNORMAL LOW (ref 2.2–3.9)
Total Protein ELP: 5.7 g/dL — ABNORMAL LOW (ref 6.0–8.5)

## 2020-08-06 ENCOUNTER — Inpatient Hospital Stay: Payer: 59 | Attending: Hematology

## 2020-08-06 ENCOUNTER — Encounter: Payer: Self-pay | Admitting: Hematology & Oncology

## 2020-08-06 ENCOUNTER — Inpatient Hospital Stay: Payer: 59

## 2020-08-06 ENCOUNTER — Inpatient Hospital Stay (HOSPITAL_BASED_OUTPATIENT_CLINIC_OR_DEPARTMENT_OTHER): Payer: 59 | Admitting: Hematology & Oncology

## 2020-08-06 ENCOUNTER — Other Ambulatory Visit: Payer: Self-pay

## 2020-08-06 VITALS — BP 100/56 | HR 60 | Temp 98.1°F | Resp 19 | Ht 68.0 in | Wt 137.8 lb

## 2020-08-06 DIAGNOSIS — Z5112 Encounter for antineoplastic immunotherapy: Secondary | ICD-10-CM | POA: Diagnosis present

## 2020-08-06 DIAGNOSIS — C9000 Multiple myeloma not having achieved remission: Secondary | ICD-10-CM | POA: Insufficient documentation

## 2020-08-06 DIAGNOSIS — Z7189 Other specified counseling: Secondary | ICD-10-CM

## 2020-08-06 LAB — CBC WITH DIFFERENTIAL (CANCER CENTER ONLY)
Abs Immature Granulocytes: 0 10*3/uL (ref 0.00–0.07)
Basophils Absolute: 0 10*3/uL (ref 0.0–0.1)
Basophils Relative: 1 %
Eosinophils Absolute: 0.1 10*3/uL (ref 0.0–0.5)
Eosinophils Relative: 2 %
HCT: 34.4 % — ABNORMAL LOW (ref 39.0–52.0)
Hemoglobin: 12.3 g/dL — ABNORMAL LOW (ref 13.0–17.0)
Immature Granulocytes: 0 %
Lymphocytes Relative: 27 %
Lymphs Abs: 0.7 10*3/uL (ref 0.7–4.0)
MCH: 34.2 pg — ABNORMAL HIGH (ref 26.0–34.0)
MCHC: 35.8 g/dL (ref 30.0–36.0)
MCV: 95.6 fL (ref 80.0–100.0)
Monocytes Absolute: 0.4 10*3/uL (ref 0.1–1.0)
Monocytes Relative: 15 %
Neutro Abs: 1.4 10*3/uL — ABNORMAL LOW (ref 1.7–7.7)
Neutrophils Relative %: 55 %
Platelet Count: 86 10*3/uL — ABNORMAL LOW (ref 150–400)
RBC: 3.6 MIL/uL — ABNORMAL LOW (ref 4.22–5.81)
RDW: 13.4 % (ref 11.5–15.5)
WBC Count: 2.5 10*3/uL — ABNORMAL LOW (ref 4.0–10.5)
nRBC: 0 % (ref 0.0–0.2)

## 2020-08-06 LAB — CMP (CANCER CENTER ONLY)
ALT: 25 U/L (ref 0–44)
AST: 28 U/L (ref 15–41)
Albumin: 4.1 g/dL (ref 3.5–5.0)
Alkaline Phosphatase: 50 U/L (ref 38–126)
Anion gap: 6 (ref 5–15)
BUN: 23 mg/dL (ref 8–23)
CO2: 29 mmol/L (ref 22–32)
Calcium: 9.3 mg/dL (ref 8.9–10.3)
Chloride: 104 mmol/L (ref 98–111)
Creatinine: 0.75 mg/dL (ref 0.61–1.24)
GFR, Est AFR Am: >60 mL/min (ref >60)
GFR, Estimated: >60 mL/min (ref >60)
Glucose, Bld: 90 mg/dL (ref 70–99)
Potassium: 4.1 mmol/L (ref 3.5–5.1)
Sodium: 139 mmol/L (ref 135–145)
Total Bilirubin: 0.7 mg/dL (ref 0.3–1.2)
Total Protein: 6 g/dL — ABNORMAL LOW (ref 6.5–8.1)

## 2020-08-06 LAB — SAVE SMEAR(SSMR), FOR PROVIDER SLIDE REVIEW

## 2020-08-06 MED ORDER — ACETAMINOPHEN 325 MG PO TABS
650.0000 mg | ORAL_TABLET | Freq: Once | ORAL | Status: AC
  Administered 2020-08-06: 650 mg via ORAL

## 2020-08-06 MED ORDER — DARATUMUMAB-HYALURONIDASE-FIHJ 1800-30000 MG-UT/15ML ~~LOC~~ SOLN
1800.0000 mg | Freq: Once | SUBCUTANEOUS | Status: AC
  Administered 2020-08-06: 1800 mg via SUBCUTANEOUS
  Filled 2020-08-06: qty 15

## 2020-08-06 MED ORDER — DIPHENHYDRAMINE HCL 25 MG PO CAPS
50.0000 mg | ORAL_CAPSULE | Freq: Once | ORAL | Status: AC
  Administered 2020-08-06: 50 mg via ORAL

## 2020-08-06 MED ORDER — DEXAMETHASONE 4 MG PO TABS
20.0000 mg | ORAL_TABLET | Freq: Once | ORAL | Status: AC
  Administered 2020-08-06: 20 mg via ORAL

## 2020-08-06 MED ORDER — DIPHENHYDRAMINE HCL 25 MG PO CAPS
ORAL_CAPSULE | ORAL | Status: AC
  Filled 2020-08-06: qty 2

## 2020-08-06 MED ORDER — ACETAMINOPHEN 325 MG PO TABS
ORAL_TABLET | ORAL | Status: AC
  Filled 2020-08-06: qty 2

## 2020-08-06 NOTE — Progress Notes (Signed)
Reviewed labs with DR Marin Olp.  Ok to treat today

## 2020-08-06 NOTE — Patient Instructions (Signed)
Daratumumab injection What is this medicine? DARATUMUMAB (dar a toom ue mab) is a monoclonal antibody. It is used to treat multiple myeloma. This medicine may be used for other purposes; ask your health care provider or pharmacist if you have questions. COMMON BRAND NAME(S): DARZALEX What should I tell my health care provider before I take this medicine? They need to know if you have any of these conditions:  infection (especially a virus infection such as chickenpox, herpes, or hepatitis B virus)  lung or breathing disease  an unusual or allergic reaction to daratumumab, other medicines, foods, dyes, or preservatives  pregnant or trying to get pregnant  breast-feeding How should I use this medicine? This medicine is for infusion into a vein. It is given by a health care professional in a hospital or clinic setting. Talk to your pediatrician regarding the use of this medicine in children. Special care may be needed. Overdosage: If you think you have taken too much of this medicine contact a poison control center or emergency room at once. NOTE: This medicine is only for you. Do not share this medicine with others. What if I miss a dose? Keep appointments for follow-up doses as directed. It is important not to miss your dose. Call your doctor or health care professional if you are unable to keep an appointment. What may interact with this medicine? Interactions have not been studied. This list may not describe all possible interactions. Give your health care provider a list of all the medicines, herbs, non-prescription drugs, or dietary supplements you use. Also tell them if you smoke, drink alcohol, or use illegal drugs. Some items may interact with your medicine. What should I watch for while using this medicine? This drug may make you feel generally unwell. Report any side effects. Continue your course of treatment even though you feel ill unless your doctor tells you to stop. This  medicine can cause serious allergic reactions. To reduce your risk you may need to take medicine before treatment with this medicine. Take your medicine as directed. This medicine can affect the results of blood tests to match your blood type. These changes can last for up to 6 months after the final dose. Your healthcare provider will do blood tests to match your blood type before you start treatment. Tell all of your healthcare providers that you are being treated with this medicine before receiving a blood transfusion. This medicine can affect the results of some tests used to determine treatment response; extra tests may be needed to evaluate response. Do not become pregnant while taking this medicine or for 3 months after stopping it. Women should inform their doctor if they wish to become pregnant or think they might be pregnant. There is a potential for serious side effects to an unborn child. Talk to your health care professional or pharmacist for more information. What side effects may I notice from receiving this medicine? Side effects that you should report to your doctor or health care professional as soon as possible:  allergic reactions like skin rash, itching or hives, swelling of the face, lips, or tongue  breathing problems  chills  cough  dizziness  feeling faint or lightheaded  headache  low blood counts - this medicine may decrease the number of white blood cells, red blood cells and platelets. You may be at increased risk for infections and bleeding.  nausea, vomiting  shortness of breath  signs of decreased platelets or bleeding - bruising, pinpoint red spots on  the skin, black, tarry stools, blood in the urine  signs of decreased red blood cells - unusually weak or tired, feeling faint or lightheaded, falls  signs of infection - fever or chills, cough, sore throat, pain or difficulty passing urine  signs and symptoms of liver injury like dark yellow or brown  urine; general ill feeling or flu-like symptoms; light-colored stools; loss of appetite; right upper belly pain; unusually weak or tired; yellowing of the eyes or skin Side effects that usually do not require medical attention (report to your doctor or health care professional if they continue or are bothersome):  back pain  constipation  diarrhea  joint pain  muscle cramps  pain, tingling, numbness in the hands or feet  swelling of the ankles, feet, hands  tiredness  trouble sleeping This list may not describe all possible side effects. Call your doctor for medical advice about side effects. You may report side effects to FDA at 1-800-FDA-1088. Where should I keep my medicine? This drug is given in a hospital or clinic and will not be stored at home. NOTE: This sheet is a summary. It may not cover all possible information. If you have questions about this medicine, talk to your doctor, pharmacist, or health care provider.  2020 Elsevier/Gold Standard (2019-07-15 18:10:54)  

## 2020-08-06 NOTE — Progress Notes (Signed)
Hematology and Oncology Follow Up Visit  Yanis Larin [id number] 08/19/1956 64 y.o. 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; Dr. Norma Fredrickson  Current Therapy: Maintenance Daratumumab (monthly) with Pomalidomide 46m (3 weeks on/1 week off)  -- Pomalyst on hold due to cytopenia Zometa every 3 months; resumed in 12/2019 post-transplant, plan for 2 years  -- next dose 08/2020   Interim History:  Mr. HBoschertis here today for follow-up and treatment.  Overall, he is doing okay.  The real problem has been arthritis.  He has a really bad hip and knees.  It is his right hip that is the problem.  At some point, he will definitely need to have replacement of his right hip and probably knee surgery bilaterally.  We have off pomalidomide right now.  I will continue to hold the pomalidomide secondary to his blood counts.  His blood counts are slowly trending upward.  When he was last here back in August, there was no monoclonal spike in his blood.  He has had no fever.  He has had no change in bowel or bladder habits.  He has had no cough.  He has had no rashes.  Overall, his performance status is ECOG 1.   Medications:  Allergies as of 08/06/2020   No Known Allergies     Medication List       Accurate as of August 06, 2020 12:11 PM. If you have any questions, ask your nurse or doctor.        acyclovir 800 MG tablet Commonly known as: ZOVIRAX Take 800 mg by mouth 2 (two) times daily.   CALCIUM-VITAMIN D PO Take 1 tablet by mouth daily.   ferrous sulfate  325 (65 FE) MG tablet Take 325 mg by mouth daily with breakfast.   folic acid 1 MG tablet Commonly known as: FOLVITE Take 1 mg by mouth daily.   ibuprofen 200 MG tablet Commonly known as: ADVIL Take 400 mg by mouth daily as needed (hip pain.).   multivitamin with minerals Tabs tablet Take 1 tablet by mouth daily.   pomalidomide 1 MG capsule Commonly known as: Pomalyst TAKE 1 CAPSULE BY MOUTH  DAILY FOR 21 DAYS, THEN 7  DAYS OFF [id number]      Allergies: No Known Allergies  Past Medical History, Surgical history, Social history, and Family History were reviewed and updated.  Review of Systems: Review of Systems  Constitutional: Negative.   HENT: Negative.   Eyes: Negative.   Respiratory: Negative.   Cardiovascular: Negative.   Gastrointestinal: Negative.   Genitourinary: Negative.   Musculoskeletal: Positive for back pain and joint pain.  Skin: Negative.   Neurological: Negative.   Endo/Heme/Allergies: Negative.   Psychiatric/Behavioral: Negative.    .Marland Kitchen  Physical Exam:  height is '5\' 8"'  (1.727 m) and weight is 137 lb 12.8 oz (62.5 kg). His oral temperature is 98.1 F (36.7 C). His blood pressure is 100/56 (abnormal) and his pulse is 60. His respiration is 19 and oxygen saturation is 100%.   Wt Readings from Last 3 Encounters:  08/06/20 137  lb 12.8 oz (62.5 kg)  07/08/20 142 lb (64.4 kg)  06/29/20 155 lb (70.3 kg)    Physical Exam Vitals reviewed.  HENT:     Head: Normocephalic and atraumatic.  Eyes:     Pupils: Pupils are equal, round, and reactive to light.  Cardiovascular:     Rate and Rhythm: Normal rate and regular rhythm.     Heart sounds: Normal heart sounds.  Pulmonary:     Effort: Pulmonary effort is normal.     Breath sounds: Normal breath sounds.  Abdominal:     General: Bowel sounds are normal.     Palpations: Abdomen is soft.  Musculoskeletal:        General: No tenderness or deformity. Normal range of motion.     Cervical back:  Normal range of motion.     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  He has swelling both knees.  He has decreased range of motion of the knees.  He has some tenderness to palpation over his right hip.  Lymphadenopathy:     Cervical: No cervical adenopathy.  Skin:    General: Skin is warm and dry.     Findings: No erythema or rash.  Neurological:     Mental Status: He is alert and oriented to person, place, and time.  Psychiatric:        Behavior: Behavior normal.        Thought Content: Thought content normal.        Judgment: Judgment normal.    Lab Results  Component Value Date   WBC 2.5 (L) 08/06/2020   HGB 12.3 (L) 08/06/2020   HCT 34.4 (L) 08/06/2020   MCV 95.6 08/06/2020   PLT 86 (L) 08/06/2020   No results found for: FERRITIN, IRON, TIBC, UIBC, IRONPCTSAT Lab Results  Component Value Date   RBC 3.60 (L) 08/06/2020   Lab Results  Component Value Date   KPAFRELGTCHN 24.8 (H) 07/08/2020   LAMBDASER 21.1 07/08/2020   KAPLAMBRATIO 1.18 07/08/2020   Lab Results  Component Value Date   IGGSERUM 430 (L) 07/08/2020   IGA 17 (L) 07/08/2020   IGMSERUM 34 07/08/2020   Lab Results  Component Value Date   TOTALPROTELP 5.7 (L) 07/08/2020   ALBUMINELP 3.7 07/08/2020   A1GS 0.3 07/08/2020   A2GS 0.6 07/08/2020   BETS 0.8 07/08/2020   GAMS 0.4 07/08/2020   MSPIKE Not Observed 07/08/2020     Chemistry      Component Value Date/Time   NA 139 08/06/2020 1123   NA 137 04/16/2020 0846   K 4.1 08/06/2020 1123   CL 104 08/06/2020 1123   CO2 29 08/06/2020 1123   BUN 23 08/06/2020 1123   BUN 19 04/16/2020 0846   CREATININE 0.75 08/06/2020 1123      Component Value Date/Time   CALCIUM 9.3 08/06/2020 1123   ALKPHOS 50 08/06/2020 1123   AST 28 08/06/2020 1123   ALT 25 08/06/2020 1123   BILITOT 0.7 08/06/2020 1123       Impression and Plan:   Mr. Deakins is a very pleasant 64 yo caucasian gentleman with 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  and DS.   He is s/p autologous stem cell transplant at [facility] 09/2019.   I would still hold his pomalidomide.  His weight bothers me a little bit.  It is still not going up.  He is eating well.  His albumin is quite good.  As such, I do not think  we have to intervene right now.  We will go ahead with the Darzalex.  He is doing well with this.  I think he says he is goes back to [facility] to see the transplant doctors right after Thanksgiving.  We will get him back in 1 month.    Volanda Napoleon, MD 9/17/202112:11 PM

## 2020-08-07 LAB — IGG, IGA, IGM
IgA: 12 mg/dL — ABNORMAL LOW (ref 61–437)
IgG (Immunoglobin G), Serum: 431 mg/dL — ABNORMAL LOW (ref 603–1613)
IgM (Immunoglobulin M), Srm: 27 mg/dL (ref 20–172)

## 2020-08-09 LAB — PROTEIN ELECTROPHORESIS, SERUM
A/G Ratio: 2.2 — ABNORMAL HIGH (ref 0.7–1.7)
Albumin ELP: 3.9 g/dL (ref 2.9–4.4)
Alpha-1-Globulin: 0.2 g/dL (ref 0.0–0.4)
Alpha-2-Globulin: 0.5 g/dL (ref 0.4–1.0)
Beta Globulin: 0.7 g/dL (ref 0.7–1.3)
Gamma Globulin: 0.4 g/dL (ref 0.4–1.8)
Globulin, Total: 1.8 g/dL — ABNORMAL LOW (ref 2.2–3.9)
Total Protein ELP: 5.7 g/dL — ABNORMAL LOW (ref 6.0–8.5)

## 2020-08-09 LAB — LACTATE DEHYDROGENASE: LDH: 161 U/L (ref 98–192)

## 2020-08-09 LAB — KAPPA/LAMBDA LIGHT CHAINS
Kappa free light chain: 14.1 mg/L (ref 3.3–19.4)
Kappa, lambda light chain ratio: 1.22 (ref 0.26–1.65)
Lambda free light chains: 11.6 mg/L (ref 5.7–26.3)

## 2020-09-02 ENCOUNTER — Other Ambulatory Visit: Payer: 59

## 2020-09-02 ENCOUNTER — Ambulatory Visit: Payer: 59 | Admitting: Hematology & Oncology

## 2020-09-02 ENCOUNTER — Ambulatory Visit: Payer: 59

## 2020-09-03 ENCOUNTER — Inpatient Hospital Stay: Payer: 59 | Attending: Hematology

## 2020-09-03 ENCOUNTER — Other Ambulatory Visit: Payer: Self-pay

## 2020-09-03 ENCOUNTER — Inpatient Hospital Stay (HOSPITAL_BASED_OUTPATIENT_CLINIC_OR_DEPARTMENT_OTHER): Payer: 59 | Admitting: Hematology & Oncology

## 2020-09-03 ENCOUNTER — Inpatient Hospital Stay: Payer: 59

## 2020-09-03 ENCOUNTER — Other Ambulatory Visit: Payer: Self-pay | Admitting: *Deleted

## 2020-09-03 VITALS — BP 91/48 | HR 53 | Temp 98.0°F | Resp 16 | Wt 137.0 lb

## 2020-09-03 DIAGNOSIS — Z5111 Encounter for antineoplastic chemotherapy: Secondary | ICD-10-CM | POA: Insufficient documentation

## 2020-09-03 DIAGNOSIS — C9000 Multiple myeloma not having achieved remission: Secondary | ICD-10-CM

## 2020-09-03 DIAGNOSIS — Z7189 Other specified counseling: Secondary | ICD-10-CM

## 2020-09-03 DIAGNOSIS — Z5112 Encounter for antineoplastic immunotherapy: Secondary | ICD-10-CM | POA: Diagnosis present

## 2020-09-03 LAB — CBC WITH DIFFERENTIAL (CANCER CENTER ONLY)
Abs Immature Granulocytes: 0.01 10*3/uL (ref 0.00–0.07)
Basophils Absolute: 0 10*3/uL (ref 0.0–0.1)
Basophils Relative: 0 %
Eosinophils Absolute: 0 10*3/uL (ref 0.0–0.5)
Eosinophils Relative: 1 %
HCT: 35.7 % — ABNORMAL LOW (ref 39.0–52.0)
Hemoglobin: 12.5 g/dL — ABNORMAL LOW (ref 13.0–17.0)
Immature Granulocytes: 0 %
Lymphocytes Relative: 28 %
Lymphs Abs: 0.7 10*3/uL (ref 0.7–4.0)
MCH: 34.1 pg — ABNORMAL HIGH (ref 26.0–34.0)
MCHC: 35 g/dL (ref 30.0–36.0)
MCV: 97.3 fL (ref 80.0–100.0)
Monocytes Absolute: 0.4 10*3/uL (ref 0.1–1.0)
Monocytes Relative: 15 %
Neutro Abs: 1.5 10*3/uL — ABNORMAL LOW (ref 1.7–7.7)
Neutrophils Relative %: 56 %
Platelet Count: 85 10*3/uL — ABNORMAL LOW (ref 150–400)
RBC: 3.67 MIL/uL — ABNORMAL LOW (ref 4.22–5.81)
RDW: 12.7 % (ref 11.5–15.5)
WBC Count: 2.6 10*3/uL — ABNORMAL LOW (ref 4.0–10.5)
nRBC: 0 % (ref 0.0–0.2)

## 2020-09-03 LAB — CMP (CANCER CENTER ONLY)
ALT: 23 U/L (ref 0–44)
AST: 27 U/L (ref 15–41)
Albumin: 4.3 g/dL (ref 3.5–5.0)
Alkaline Phosphatase: 52 U/L (ref 38–126)
Anion gap: 6 (ref 5–15)
BUN: 20 mg/dL (ref 8–23)
CO2: 30 mmol/L (ref 22–32)
Calcium: 9.8 mg/dL (ref 8.9–10.3)
Chloride: 104 mmol/L (ref 98–111)
Creatinine: 0.85 mg/dL (ref 0.61–1.24)
GFR, Estimated: >60 mL/min (ref >60)
Glucose, Bld: 78 mg/dL (ref 70–99)
Potassium: 4.2 mmol/L (ref 3.5–5.1)
Sodium: 140 mmol/L (ref 135–145)
Total Bilirubin: 0.8 mg/dL (ref 0.3–1.2)
Total Protein: 5.7 g/dL — ABNORMAL LOW (ref 6.5–8.1)

## 2020-09-03 MED ORDER — DARATUMUMAB-HYALURONIDASE-FIHJ 1800-30000 MG-UT/15ML ~~LOC~~ SOLN
1800.0000 mg | Freq: Once | SUBCUTANEOUS | Status: AC
  Administered 2020-09-03: 1800 mg via SUBCUTANEOUS
  Filled 2020-09-03: qty 15

## 2020-09-03 MED ORDER — DIPHENHYDRAMINE HCL 25 MG PO CAPS
ORAL_CAPSULE | ORAL | Status: AC
  Filled 2020-09-03: qty 2

## 2020-09-03 MED ORDER — ACETAMINOPHEN 325 MG PO TABS
ORAL_TABLET | ORAL | Status: AC
  Filled 2020-09-03: qty 2

## 2020-09-03 MED ORDER — DEXAMETHASONE 4 MG PO TABS: ORAL_TABLET | ORAL | 2 refills | Status: DC

## 2020-09-03 MED ORDER — DEXAMETHASONE 4 MG PO TABS
20.0000 mg | ORAL_TABLET | Freq: Once | ORAL | Status: AC
  Administered 2020-09-03: 20 mg via ORAL

## 2020-09-03 MED ORDER — DEXAMETHASONE 4 MG PO TABS
ORAL_TABLET | ORAL | Status: AC
  Filled 2020-09-03: qty 5

## 2020-09-03 MED ORDER — DIPHENHYDRAMINE HCL 25 MG PO CAPS
50.0000 mg | ORAL_CAPSULE | Freq: Once | ORAL | Status: AC
  Administered 2020-09-03: 50 mg via ORAL

## 2020-09-03 MED ORDER — ACETAMINOPHEN 325 MG PO TABS
650.0000 mg | ORAL_TABLET | Freq: Once | ORAL | Status: AC
  Administered 2020-09-03: 650 mg via ORAL

## 2020-09-03 MED ORDER — ZOLEDRONIC ACID 4 MG/100ML IV SOLN
4.0000 mg | Freq: Once | INTRAVENOUS | Status: AC
  Administered 2020-09-03: 4 mg via INTRAVENOUS
  Filled 2020-09-03: qty 100

## 2020-09-03 MED ORDER — SODIUM CHLORIDE 0.9 % IV SOLN
Freq: Once | INTRAVENOUS | Status: AC
  Filled 2020-09-03: qty 250

## 2020-09-03 NOTE — Patient Instructions (Addendum)
Daratumumab injection What is this medicine? DARATUMUMAB (dar a toom ue mab) is a monoclonal antibody. It is used to treat multiple myeloma. This medicine may be used for other purposes; ask your health care provider or pharmacist if you have questions. COMMON BRAND NAME(S): DARZALEX What should I tell my health care provider before I take this medicine? They need to know if you have any of these conditions:  infection (especially a virus infection such as chickenpox, herpes, or hepatitis B virus)  lung or breathing disease  an unusual or allergic reaction to daratumumab, other medicines, foods, dyes, or preservatives  pregnant or trying to get pregnant  breast-feeding How should I use this medicine? This medicine is for infusion into a vein. It is given by a health care professional in a hospital or clinic setting. Talk to your pediatrician regarding the use of this medicine in children. Special care may be needed. Overdosage: If you think you have taken too much of this medicine contact a poison control center or emergency room at once. NOTE: This medicine is only for you. Do not share this medicine with others. What if I miss a dose? Keep appointments for follow-up doses as directed. It is important not to miss your dose. Call your doctor or health care professional if you are unable to keep an appointment. What may interact with this medicine? Interactions have not been studied. This list may not describe all possible interactions. Give your health care provider a list of all the medicines, herbs, non-prescription drugs, or dietary supplements you use. Also tell them if you smoke, drink alcohol, or use illegal drugs. Some items may interact with your medicine. What should I watch for while using this medicine? This drug may make you feel generally unwell. Report any side effects. Continue your course of treatment even though you feel ill unless your doctor tells you to stop. This  medicine can cause serious allergic reactions. To reduce your risk you may need to take medicine before treatment with this medicine. Take your medicine as directed. This medicine can affect the results of blood tests to match your blood type. These changes can last for up to 6 months after the final dose. Your healthcare provider will do blood tests to match your blood type before you start treatment. Tell all of your healthcare providers that you are being treated with this medicine before receiving a blood transfusion. This medicine can affect the results of some tests used to determine treatment response; extra tests may be needed to evaluate response. Do not become pregnant while taking this medicine or for 3 months after stopping it. Women should inform their doctor if they wish to become pregnant or think they might be pregnant. There is a potential for serious side effects to an unborn child. Talk to your health care professional or pharmacist for more information. What side effects may I notice from receiving this medicine? Side effects that you should report to your doctor or health care professional as soon as possible:  allergic reactions like skin rash, itching or hives, swelling of the face, lips, or tongue  breathing problems  chills  cough  dizziness  feeling faint or lightheaded  headache  low blood counts - this medicine may decrease the number of white blood cells, red blood cells and platelets. You may be at increased risk for infections and bleeding.  nausea, vomiting  shortness of breath  signs of decreased platelets or bleeding - bruising, pinpoint red spots on  the skin, black, tarry stools, blood in the urine  signs of decreased red blood cells - unusually weak or tired, feeling faint or lightheaded, falls  signs of infection - fever or chills, cough, sore throat, pain or difficulty passing urine  signs and symptoms of liver injury like dark yellow or brown  urine; general ill feeling or flu-like symptoms; light-colored stools; loss of appetite; right upper belly pain; unusually weak or tired; yellowing of the eyes or skin Side effects that usually do not require medical attention (report to your doctor or health care professional if they continue or are bothersome):  back pain  constipation  diarrhea  joint pain  muscle cramps  pain, tingling, numbness in the hands or feet  swelling of the ankles, feet, hands  tiredness  trouble sleeping This list may not describe all possible side effects. Call your doctor for medical advice about side effects. You may report side effects to FDA at 1-800-FDA-1088. Where should I keep my medicine? This drug is given in a hospital or clinic and will not be stored at home. NOTE: This sheet is a summary. It may not cover all possible information. If you have questions about this medicine, talk to your doctor, pharmacist, or health care provider.  2020 Elsevier/Gold Standard (2019-07-15 18:10:54) Zoledronic Acid injection (Hypercalcemia, Oncology) What is this medicine? ZOLEDRONIC ACID (ZOE le dron ik AS id) lowers the amount of calcium loss from bone. It is used to treat too much calcium in your blood from cancer. It is also used to prevent complications of cancer that has spread to the bone. This medicine may be used for other purposes; ask your health care provider or pharmacist if you have questions. COMMON BRAND NAME(S): Zometa What should I tell my health care provider before I take this medicine? They need to know if you have any of these conditions:  aspirin-sensitive asthma  cancer, especially if you are receiving medicines used to treat cancer  dental disease or wear dentures  infection  kidney disease  receiving corticosteroids like dexamethasone or prednisone  an unusual or allergic reaction to zoledronic acid, other medicines, foods, dyes, or preservatives  pregnant or trying to  get pregnant  breast-feeding How should I use this medicine? This medicine is for infusion into a vein. It is given by a health care professional in a hospital or clinic setting. Talk to your pediatrician regarding the use of this medicine in children. Special care may be needed. Overdosage: If you think you have taken too much of this medicine contact a poison control center or emergency room at once. NOTE: This medicine is only for you. Do not share this medicine with others. What if I miss a dose? It is important not to miss your dose. Call your doctor or health care professional if you are unable to keep an appointment. What may interact with this medicine?  certain antibiotics given by injection  NSAIDs, medicines for pain and inflammation, like ibuprofen or naproxen  some diuretics like bumetanide, furosemide  teriparatide  thalidomide This list may not describe all possible interactions. Give your health care provider a list of all the medicines, herbs, non-prescription drugs, or dietary supplements you use. Also tell them if you smoke, drink alcohol, or use illegal drugs. Some items may interact with your medicine. What should I watch for while using this medicine? Visit your doctor or health care professional for regular checkups. It may be some time before you see the benefit from this  medicine. Do not stop taking your medicine unless your doctor tells you to. Your doctor may order blood tests or other tests to see how you are doing. Women should inform their doctor if they wish to become pregnant or think they might be pregnant. There is a potential for serious side effects to an unborn child. Talk to your health care professional or pharmacist for more information. You should make sure that you get enough calcium and vitamin D while you are taking this medicine. Discuss the foods you eat and the vitamins you take with your health care professional. Some people who take this  medicine have severe bone, joint, and/or muscle pain. This medicine may also increase your risk for jaw problems or a broken thigh bone. Tell your doctor right away if you have severe pain in your jaw, bones, joints, or muscles. Tell your doctor if you have any pain that does not go away or that gets worse. Tell your dentist and dental surgeon that you are taking this medicine. You should not have major dental surgery while on this medicine. See your dentist to have a dental exam and fix any dental problems before starting this medicine. Take good care of your teeth while on this medicine. Make sure you see your dentist for regular follow-up appointments. What side effects may I notice from receiving this medicine? Side effects that you should report to your doctor or health care professional as soon as possible:  allergic reactions like skin rash, itching or hives, swelling of the face, lips, or tongue  anxiety, confusion, or depression  breathing problems  changes in vision  eye pain  feeling faint or lightheaded, falls  jaw pain, especially after dental work  mouth sores  muscle cramps, stiffness, or weakness  redness, blistering, peeling or loosening of the skin, including inside the mouth  trouble passing urine or change in the amount of urine Side effects that usually do not require medical attention (report to your doctor or health care professional if they continue or are bothersome):  bone, joint, or muscle pain  constipation  diarrhea  fever  hair loss  irritation at site where injected  loss of appetite  nausea, vomiting  stomach upset  trouble sleeping  trouble swallowing  weak or tired This list may not describe all possible side effects. Call your doctor for medical advice about side effects. You may report side effects to FDA at 1-800-FDA-1088. Where should I keep my medicine? This drug is given in a hospital or clinic and will not be stored at  home. NOTE: This sheet is a summary. It may not cover all possible information. If you have questions about this medicine, talk to your doctor, pharmacist, or health care provider.  2020 Elsevier/Gold Standard (2014-04-04 14:19:39)

## 2020-09-03 NOTE — Progress Notes (Signed)
Hematology and Oncology Follow Up Visit  Danny Juarez [id number] 09/16/1956 64 y.o. 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 Norma Fredrickson  Current Therapy: Maintenance Daratumumab (monthly) with Pomalidomide 82m (3 weeks on/1 week off)  -- Pomalyst on hold due to cytopenia Zometa every 3 months; resumed in 12/2019 post-transplant, plan for 2 years  -- next dose 11/2020   Interim History:  Danny Juarez here today for follow-up and treatment. We still hold the pomalidomide. I do still think that his white cell count and platelet count is high enough yet that we can restart the pomalidomide.  As far as the overall myeloma levels go, overall, he is doing okay. His last M spike was not found. His IgG G level was 431 mg/dL. The lambda light chain was 1.2 mg/dL.  I think that as long as the myeloma levels look so good, we will try to hold the pomalidomide.  He still has problems with his arthritis. Mostly it is the right hip. This really bothers him when a cold front comes through with respect to weather. He feels a lot of achiness.  He has had no problems with bowels or bladder. He has had no issues with rashes. There is been no leg swelling. He has had no bleeding. He has had no fever.  Currently, his performance status is ECOG 1.   Medications:  Allergies as of 09/03/2020   No Known Allergies     Medication List       Accurate as of September 03, 2020  1:26 PM. If you have any questions, ask your nurse or doctor.        STOP taking these medications    pomalidomide 1 MG capsule Commonly known as: Pomalyst Stopped by: PVolanda Napoleon MD     TAKE these medications   acyclovir 800 MG tablet Commonly known as: ZOVIRAX Take 800 mg by mouth 2 (two) times daily.   CALCIUM-VITAMIN D PO Take 1 tablet by mouth daily.   ferrous sulfate 325 (65 FE) MG tablet Take 325 mg by mouth daily with breakfast.   folic acid 1 MG tablet Commonly known as: FOLVITE Take 1 mg by mouth daily.   ibuprofen 200 MG tablet Commonly known as: ADVIL Take 400 mg by mouth daily as needed (hip pain.).   multivitamin with minerals Tabs tablet Take 1 tablet by mouth daily.       Allergies: No Known Allergies  Past Medical History, Surgical history, Social history, and Family History were reviewed and updated.  Review of Systems: Review of Systems  Constitutional: Negative.   HENT: Negative.   Eyes: Negative.   Respiratory: Negative.   Cardiovascular: Negative.   Gastrointestinal: Negative.   Genitourinary: Negative.   Musculoskeletal: Positive for back pain and joint pain.  Skin: Negative.   Neurological: Negative.   Endo/Heme/Allergies: Negative.   Psychiatric/Behavioral: Negative.    .Marland Kitchen  Physical Exam:  weight is 137 lb (62.1 kg). His oral temperature is 98 F (36.7 C). His blood pressure is 91/48 (abnormal) and his pulse is 53 (abnormal). His  respiration is 16 and oxygen saturation is 100%.   Wt Readings from Last 3 Encounters:  09/03/20 137 lb (62.1 kg)  08/06/20 137 lb 12.8 oz (62.5 kg)  07/08/20 142 lb (64.4 kg)    Physical Exam Vitals reviewed.  HENT:     Head: Normocephalic and atraumatic.  Eyes:     Pupils: Pupils are equal, round, and reactive to light.  Cardiovascular:     Rate and Rhythm: Normal rate and regular rhythm.     Heart sounds: Normal heart sounds.  Pulmonary:     Effort: Pulmonary effort is normal.     Breath sounds: Normal breath sounds.  Abdominal:     General: Bowel sounds are normal.     Palpations:  Abdomen is soft.  Musculoskeletal:        General: No tenderness or deformity. Normal range of motion.     Cervical back: Normal range of motion.     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  He has swelling both knees.  He has decreased range of motion of the knees.  He has some tenderness to palpation over his right hip.  Lymphadenopathy:     Cervical: No cervical adenopathy.  Skin:    General: Skin is warm and dry.     Findings: No erythema or rash.  Neurological:     Mental Status: He is alert and oriented to person, place, and time.  Psychiatric:        Behavior: Behavior normal.        Thought Content: Thought content normal.        Judgment: Judgment normal.    Lab Results  Component Value Date   WBC 2.6 (L) 09/03/2020   HGB 12.5 (L) 09/03/2020   HCT 35.7 (L) 09/03/2020   MCV 97.3 09/03/2020   PLT 85 (L) 09/03/2020   No results found for: FERRITIN, IRON, TIBC, UIBC, IRONPCTSAT Lab Results  Component Value Date   RBC 3.67 (L) 09/03/2020   Lab Results  Component Value Date   KPAFRELGTCHN 14.1 08/06/2020   LAMBDASER 11.6 08/06/2020   KAPLAMBRATIO 1.22 08/06/2020   Lab Results  Component Value Date   IGGSERUM 431 (L) 08/06/2020   IGA 12 (L) 08/06/2020   IGMSERUM 27 08/06/2020   Lab Results  Component Value Date   TOTALPROTELP 5.7 (L) 08/06/2020   ALBUMINELP 3.9 08/06/2020   A1GS 0.2 08/06/2020   A2GS 0.5 08/06/2020   BETS 0.7 08/06/2020   GAMS 0.4 08/06/2020   MSPIKE Not Observed 08/06/2020   SPEI Comment 08/06/2020     Chemistry      Component Value Date/Time   NA 140 09/03/2020 1147   NA 137 04/16/2020 0846   K 4.2 09/03/2020 1147   CL 104 09/03/2020 1147   CO2 30 09/03/2020 1147   BUN 20 09/03/2020 1147   BUN 19 04/16/2020 0846   CREATININE 0.85 09/03/2020 1147      Component Value Date/Time   CALCIUM 9.8 09/03/2020 1147   ALKPHOS 52 09/03/2020 1147   AST 27 09/03/2020 1147   ALT 23 09/03/2020 1147   BILITOT 0.8 09/03/2020 1147         Impression and Plan:   Danny Juarez is a very pleasant 64 yo caucasian gentleman with 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 and DS.   He is s/p autologous stem cell transplant at [facility] 09/2019.   I would still hold his pomalidomide.  Overall, I really think that he is doing  quite well. We will continue on the monthly Darzalex/Faspro.  He will get his Zometa today.  We will plan to get him back in 1 more month. We might be able to adjust his schedule according to the holiday season.  Volanda Napoleon, MD 10/15/20211:26 PM

## 2020-09-03 NOTE — Progress Notes (Signed)
OK to treat today with ANC-1.5 and platelet count-85 per Dr. Marin Olp.

## 2020-09-04 LAB — IGG, IGA, IGM
IgA: 11 mg/dL — ABNORMAL LOW (ref 61–437)
IgG (Immunoglobin G), Serum: 479 mg/dL — ABNORMAL LOW (ref 603–1613)
IgM (Immunoglobulin M), Srm: 30 mg/dL (ref 20–172)

## 2020-09-06 LAB — PROTEIN ELECTROPHORESIS, SERUM, WITH REFLEX
A/G Ratio: 1.8 — ABNORMAL HIGH (ref 0.7–1.7)
Albumin ELP: 3.8 g/dL (ref 2.9–4.4)
Alpha-1-Globulin: 0.2 g/dL (ref 0.0–0.4)
Alpha-2-Globulin: 0.5 g/dL (ref 0.4–1.0)
Beta Globulin: 0.8 g/dL (ref 0.7–1.3)
Gamma Globulin: 0.4 g/dL (ref 0.4–1.8)
Globulin, Total: 2.1 g/dL — ABNORMAL LOW (ref 2.2–3.9)
Total Protein ELP: 5.9 g/dL — ABNORMAL LOW (ref 6.0–8.5)

## 2020-09-06 LAB — KAPPA/LAMBDA LIGHT CHAINS
Kappa free light chain: 13.5 mg/L (ref 3.3–19.4)
Kappa, lambda light chain ratio: 1.21 (ref 0.26–1.65)
Lambda free light chains: 11.2 mg/L (ref 5.7–26.3)

## 2020-09-17 ENCOUNTER — Ambulatory Visit: Payer: 59

## 2020-09-17 ENCOUNTER — Other Ambulatory Visit: Payer: 59

## 2020-10-01 ENCOUNTER — Telehealth: Payer: Self-pay

## 2020-10-01 ENCOUNTER — Inpatient Hospital Stay: Payer: 59

## 2020-10-01 ENCOUNTER — Other Ambulatory Visit: Payer: Self-pay

## 2020-10-01 ENCOUNTER — Inpatient Hospital Stay: Payer: 59 | Attending: Hematology

## 2020-10-01 ENCOUNTER — Inpatient Hospital Stay (HOSPITAL_BASED_OUTPATIENT_CLINIC_OR_DEPARTMENT_OTHER): Payer: 59 | Admitting: Hematology & Oncology

## 2020-10-01 ENCOUNTER — Encounter: Payer: Self-pay | Admitting: Hematology & Oncology

## 2020-10-01 VITALS — BP 105/54 | HR 60 | Temp 98.4°F | Resp 18 | Wt 139.0 lb

## 2020-10-01 DIAGNOSIS — C9000 Multiple myeloma not having achieved remission: Secondary | ICD-10-CM

## 2020-10-01 DIAGNOSIS — Z5112 Encounter for antineoplastic immunotherapy: Secondary | ICD-10-CM | POA: Diagnosis present

## 2020-10-01 DIAGNOSIS — Z7189 Other specified counseling: Secondary | ICD-10-CM

## 2020-10-01 LAB — CBC WITH DIFFERENTIAL (CANCER CENTER ONLY)
Abs Immature Granulocytes: 0 10*3/uL (ref 0.00–0.07)
Basophils Absolute: 0 10*3/uL (ref 0.0–0.1)
Basophils Relative: 1 %
Eosinophils Absolute: 0 10*3/uL (ref 0.0–0.5)
Eosinophils Relative: 2 %
HCT: 35.9 % — ABNORMAL LOW (ref 39.0–52.0)
Hemoglobin: 12.6 g/dL — ABNORMAL LOW (ref 13.0–17.0)
Immature Granulocytes: 0 %
Lymphocytes Relative: 19 %
Lymphs Abs: 0.5 10*3/uL — ABNORMAL LOW (ref 0.7–4.0)
MCH: 34.7 pg — ABNORMAL HIGH (ref 26.0–34.0)
MCHC: 35.1 g/dL (ref 30.0–36.0)
MCV: 98.9 fL (ref 80.0–100.0)
Monocytes Absolute: 0.4 10*3/uL (ref 0.1–1.0)
Monocytes Relative: 15 %
Neutro Abs: 1.7 10*3/uL (ref 1.7–7.7)
Neutrophils Relative %: 63 %
Platelet Count: 96 10*3/uL — ABNORMAL LOW (ref 150–400)
RBC: 3.63 MIL/uL — ABNORMAL LOW (ref 4.22–5.81)
RDW: 11.9 % (ref 11.5–15.5)
WBC Count: 2.6 10*3/uL — ABNORMAL LOW (ref 4.0–10.5)
nRBC: 0 % (ref 0.0–0.2)

## 2020-10-01 LAB — CMP (CANCER CENTER ONLY)
ALT: 23 U/L (ref 0–44)
AST: 27 U/L (ref 15–41)
Albumin: 4.2 g/dL (ref 3.5–5.0)
Alkaline Phosphatase: 59 U/L (ref 38–126)
Anion gap: 9 (ref 5–15)
BUN: 26 mg/dL — ABNORMAL HIGH (ref 8–23)
CO2: 27 mmol/L (ref 22–32)
Calcium: 9.5 mg/dL (ref 8.9–10.3)
Chloride: 103 mmol/L (ref 98–111)
Creatinine: 0.8 mg/dL (ref 0.61–1.24)
GFR, Estimated: >60 mL/min (ref >60)
Glucose, Bld: 90 mg/dL (ref 70–99)
Potassium: 4.6 mmol/L (ref 3.5–5.1)
Sodium: 139 mmol/L (ref 135–145)
Total Bilirubin: 0.6 mg/dL (ref 0.3–1.2)
Total Protein: 5.9 g/dL — ABNORMAL LOW (ref 6.5–8.1)

## 2020-10-01 LAB — LACTATE DEHYDROGENASE: LDH: 135 U/L (ref 98–192)

## 2020-10-01 MED ORDER — DARATUMUMAB-HYALURONIDASE-FIHJ 1800-30000 MG-UT/15ML ~~LOC~~ SOLN
1800.0000 mg | Freq: Once | SUBCUTANEOUS | Status: AC
  Administered 2020-10-01: 1800 mg via SUBCUTANEOUS
  Filled 2020-10-01: qty 15

## 2020-10-01 MED ORDER — DEXAMETHASONE 4 MG PO TABS: 20.0000 mg | ORAL_TABLET | Freq: Once | ORAL | Status: DC

## 2020-10-01 MED ORDER — ACETAMINOPHEN 325 MG PO TABS: 650.0000 mg | ORAL_TABLET | Freq: Once | ORAL | Status: DC

## 2020-10-01 MED ORDER — DIPHENHYDRAMINE HCL 25 MG PO CAPS: 50.0000 mg | ORAL_CAPSULE | Freq: Once | ORAL | Status: DC

## 2020-10-01 NOTE — Patient Instructions (Signed)
[facility] Discharge Instructions for Patients Receiving Chemotherapy  Today you received the following chemotherapy agents Faspro To help prevent nausea and vomiting after your treatment, we encourage you to take your nausea medication as prescribed.   If you develop nausea and vomiting that is not controlled by your nausea medication, call the clinic.   BELOW ARE SYMPTOMS THAT SHOULD BE REPORTED IMMEDIATELY:  *FEVER GREATER THAN 100.5 F  *CHILLS WITH OR WITHOUT FEVER  NAUSEA AND VOMITING THAT IS NOT CONTROLLED WITH YOUR NAUSEA MEDICATION  *UNUSUAL SHORTNESS OF BREATH  *UNUSUAL BRUISING OR BLEEDING  TENDERNESS IN MOUTH AND THROAT WITH OR WITHOUT PRESENCE OF ULCERS  *URINARY PROBLEMS  *BOWEL PROBLEMS  UNUSUAL RASH Items with * indicate a potential emergency and should be followed up as soon as possible.  Feel free to call the clinic should you have any questions or concerns. The clinic phone number is [phone].  Please show the [facility] at check-in to the Emergency Department and triage nurse.

## 2020-10-01 NOTE — Progress Notes (Signed)
Hematology and Oncology Follow Up Visit  Danny Juarez [id number] [birth year] 64 y.o. 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 Norma Juarez  Current Therapy: Maintenance Daratumumab (monthly) with Pomalidomide 60m (3 weeks on/1 week off)  -- Pomalyst on hold due to cytopenia Zometa every 3 months; resumed in 12/2019 post-transplant, plan for 2 years  -- next dose 11/2020   Interim History:  Mr. HStanekis here today for follow-up and treatment.  His blood counts are coming up slowly but surely.  He is still off the pomalidomide.  He probably has been off pomalidomide for about 2 months or so.  He feels well.  He has had no fever.  He has had no cough or shortness of breath.  He has had no change in bowel or bladder habits.  His myeloma studies have all looked okay.  Back in October, there was no monoclonal spike noted in his blood.  His IgG level was 480 mg/dL.  The lambda light chain was 1.1 mg/dL.  He is got have a nice Thanksgiving at home.  He has had no problems with cough.  Overall, his performance status is probably ECOG 1.    Medications:  Allergies as of 10/01/2020   No Known Allergies     Medication List       Accurate as of October 01, 2020  8:59 AM. If you have any questions, ask your nurse or doctor.        acyclovir 800 MG tablet Commonly known as: ZOVIRAX Take 800 mg by mouth 2 (two) times daily.   CALCIUM-VITAMIN D PO Take 1 tablet by mouth daily.   dexamethasone 4 MG tablet Commonly known as: DECADRON Take five tablets  (20 mg) at home on day of chemo treatment.   ferrous sulfate 325 (65 FE) MG tablet Take 325 mg by mouth daily with breakfast.   folic acid 1 MG tablet Commonly known as: FOLVITE Take 1 mg by mouth daily.   ibuprofen 200 MG tablet Commonly known as: ADVIL Take 400 mg by mouth daily as needed (hip pain.).   multivitamin with minerals Tabs tablet Take 1 tablet by mouth daily.       Allergies: No Known Allergies  Past Medical History, Surgical history, Social history, and Family History were reviewed and updated.  Review of Systems: Review of Systems  Constitutional: Negative.   HENT: Negative.   Eyes: Negative.   Respiratory: Negative.   Cardiovascular: Negative.   Gastrointestinal: Negative.   Genitourinary: Negative.   Musculoskeletal: Positive for back pain and joint pain.  Skin: Negative.   Neurological: Negative.   Endo/Heme/Allergies: Negative.   Psychiatric/Behavioral: Negative.    .Marland Kitchen  Physical Exam:  weight is 139 lb (63 kg). His oral temperature is 98.4 F (36.9 C). His blood pressure is 105/54 (abnormal) and his pulse is 60. His respiration is 18 and oxygen saturation is 99%.   Wt Readings from Last 3 Encounters:  10/01/20 139 lb (63 kg)  09/03/20 137 lb (62.1 kg)  08/06/20 137 lb 12.8 oz (62.5 kg)    Physical  Exam Vitals reviewed.  HENT:     Head: Normocephalic and atraumatic.  Eyes:     Pupils: Pupils are equal, round, and reactive to light.  Cardiovascular:     Rate and Rhythm: Normal rate and regular rhythm.     Heart sounds: Normal heart sounds.  Pulmonary:     Effort: Pulmonary effort is normal.     Breath sounds: Normal breath sounds.  Abdominal:     General: Bowel sounds are normal.     Palpations: Abdomen is soft.  Musculoskeletal:        General: No tenderness or deformity. Normal range of motion.     Cervical back: Normal range of motion.     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  He has swelling both knees.  He has  decreased range of motion of the knees.  He has some tenderness to palpation over his right hip.  Lymphadenopathy:     Cervical: No cervical adenopathy.  Skin:    General: Skin is warm and dry.     Findings: No erythema or rash.  Neurological:     Mental Status: He is alert and oriented to person, place, and time.  Psychiatric:        Behavior: Behavior normal.        Thought Content: Thought content normal.        Judgment: Judgment normal.    Lab Results  Component Value Date   WBC 2.6 (L) 10/01/2020   HGB 12.6 (L) 10/01/2020   HCT 35.9 (L) 10/01/2020   MCV 98.9 10/01/2020   PLT 96 (L) 10/01/2020   No results found for: FERRITIN, IRON, TIBC, UIBC, IRONPCTSAT Lab Results  Component Value Date   RBC 3.63 (L) 10/01/2020   Lab Results  Component Value Date   KPAFRELGTCHN 13.5 09/03/2020   LAMBDASER 11.2 09/03/2020   KAPLAMBRATIO 1.21 09/03/2020   Lab Results  Component Value Date   IGGSERUM 479 (L) 09/03/2020   IGA 11 (L) 09/03/2020   IGMSERUM 30 09/03/2020   Lab Results  Component Value Date   TOTALPROTELP 5.9 (L) 09/03/2020   ALBUMINELP 3.8 09/03/2020   A1GS 0.2 09/03/2020   A2GS 0.5 09/03/2020   BETS 0.8 09/03/2020   GAMS 0.4 09/03/2020   MSPIKE Not Observed 09/03/2020   SPEI Comment 08/06/2020     Chemistry      Component Value Date/Time   NA 139 10/01/2020 0750   NA 137 04/16/2020 0846   K 4.6 10/01/2020 0750   CL 103 10/01/2020 0750   CO2 27 10/01/2020 0750   BUN 26 (H) 10/01/2020 0750   BUN 19 04/16/2020 0846   CREATININE 0.80 10/01/2020 0750      Component Value Date/Time   CALCIUM 9.5 10/01/2020 0750   ALKPHOS 59 10/01/2020 0750   AST 27 10/01/2020 0750   ALT 23 10/01/2020 0750   BILITOT 0.6 10/01/2020 0750       Impression and Plan:   Danny Juarez is a very pleasant 64 yo caucasian gentleman with 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 and DS.   He is s/p autologous stem cell transplant at [facility] 09/2019.   I would still hold  his pomalidomide.  Hopefully, when his platelet count gets above 100,000, we might be able to restart the pomalidomide.  Overall, I really think that he is doing quite well. We will continue on the monthly Darzalex/Faspro.  We will plan to get him back in 1 more month. We might  be able to adjust his schedule according to the holiday season.  Volanda Napoleon, MD 11/12/20218:59 AM

## 2020-10-01 NOTE — Progress Notes (Signed)
OK to treat with platelets 96 per Dr. Marin Olp.   Pt discharged in no apparent distress. Pt left ambulatory without assistance. Pt aware of discharge instructions and verbalized understanding and had no further questions.

## 2020-10-01 NOTE — Telephone Encounter (Signed)
10/29/20 appt already in place/ 10/01/20 LOS - made 11/26/20 appt.... AOM

## 2020-10-02 LAB — IGG, IGA, IGM
IgA: 11 mg/dL — ABNORMAL LOW (ref 61–437)
IgG (Immunoglobin G), Serum: 454 mg/dL — ABNORMAL LOW (ref 603–1613)
IgM (Immunoglobulin M), Srm: 26 mg/dL (ref 20–172)

## 2020-10-04 LAB — KAPPA/LAMBDA LIGHT CHAINS
Kappa free light chain: 12.8 mg/L (ref 3.3–19.4)
Kappa, lambda light chain ratio: 1.27 (ref 0.26–1.65)
Lambda free light chains: 10.1 mg/L (ref 5.7–26.3)

## 2020-10-06 LAB — IMMUNOFIXATION REFLEX, SERUM
IgA: 14 mg/dL — ABNORMAL LOW (ref 61–437)
IgG (Immunoglobin G), Serum: 548 mg/dL — ABNORMAL LOW (ref 603–1613)
IgM (Immunoglobulin M), Srm: 33 mg/dL (ref 20–172)

## 2020-10-06 LAB — PROTEIN ELECTROPHORESIS, SERUM, WITH REFLEX
A/G Ratio: 1.8 — ABNORMAL HIGH (ref 0.7–1.7)
Albumin ELP: 3.6 g/dL (ref 2.9–4.4)
Alpha-1-Globulin: 0.2 g/dL (ref 0.0–0.4)
Alpha-2-Globulin: 0.5 g/dL (ref 0.4–1.0)
Beta Globulin: 0.8 g/dL (ref 0.7–1.3)
Gamma Globulin: 0.5 g/dL (ref 0.4–1.8)
Globulin, Total: 2 g/dL — ABNORMAL LOW (ref 2.2–3.9)
M-Spike, %: 0.1 g/dL — ABNORMAL HIGH
SPEP Interpretation: 0
Total Protein ELP: 5.6 g/dL — ABNORMAL LOW (ref 6.0–8.5)

## 2020-10-29 ENCOUNTER — Other Ambulatory Visit: Payer: Self-pay

## 2020-10-29 ENCOUNTER — Encounter: Payer: Self-pay | Admitting: Hematology & Oncology

## 2020-10-29 ENCOUNTER — Telehealth: Payer: Self-pay | Admitting: Hematology & Oncology

## 2020-10-29 ENCOUNTER — Inpatient Hospital Stay: Payer: 59

## 2020-10-29 ENCOUNTER — Inpatient Hospital Stay: Payer: 59 | Attending: Hematology

## 2020-10-29 ENCOUNTER — Inpatient Hospital Stay (HOSPITAL_BASED_OUTPATIENT_CLINIC_OR_DEPARTMENT_OTHER): Payer: 59 | Admitting: Hematology & Oncology

## 2020-10-29 VITALS — BP 111/71 | HR 61 | Temp 98.4°F | Resp 20 | Wt 140.8 lb

## 2020-10-29 DIAGNOSIS — C9000 Multiple myeloma not having achieved remission: Secondary | ICD-10-CM | POA: Diagnosis present

## 2020-10-29 DIAGNOSIS — Z5112 Encounter for antineoplastic immunotherapy: Secondary | ICD-10-CM | POA: Insufficient documentation

## 2020-10-29 DIAGNOSIS — Z7189 Other specified counseling: Secondary | ICD-10-CM

## 2020-10-29 LAB — CBC WITH DIFFERENTIAL (CANCER CENTER ONLY)
Abs Immature Granulocytes: 0.01 10*3/uL (ref 0.00–0.07)
Basophils Absolute: 0 10*3/uL (ref 0.0–0.1)
Basophils Relative: 1 %
Eosinophils Absolute: 0 10*3/uL (ref 0.0–0.5)
Eosinophils Relative: 1 %
HCT: 35.2 % — ABNORMAL LOW (ref 39.0–52.0)
Hemoglobin: 12.5 g/dL — ABNORMAL LOW (ref 13.0–17.0)
Immature Granulocytes: 0 %
Lymphocytes Relative: 25 %
Lymphs Abs: 0.8 10*3/uL (ref 0.7–4.0)
MCH: 34.4 pg — ABNORMAL HIGH (ref 26.0–34.0)
MCHC: 35.5 g/dL (ref 30.0–36.0)
MCV: 97 fL (ref 80.0–100.0)
Monocytes Absolute: 0.4 10*3/uL (ref 0.1–1.0)
Monocytes Relative: 13 %
Neutro Abs: 2 10*3/uL (ref 1.7–7.7)
Neutrophils Relative %: 60 %
Platelet Count: 92 10*3/uL — ABNORMAL LOW (ref 150–400)
RBC: 3.63 MIL/uL — ABNORMAL LOW (ref 4.22–5.81)
RDW: 11.9 % (ref 11.5–15.5)
WBC Count: 3.3 10*3/uL — ABNORMAL LOW (ref 4.0–10.5)
nRBC: 0 % (ref 0.0–0.2)

## 2020-10-29 LAB — CMP (CANCER CENTER ONLY)
ALT: 27 U/L (ref 0–44)
AST: 29 U/L (ref 15–41)
Albumin: 4.2 g/dL (ref 3.5–5.0)
Alkaline Phosphatase: 52 U/L (ref 38–126)
Anion gap: 6 (ref 5–15)
BUN: 24 mg/dL — ABNORMAL HIGH (ref 8–23)
CO2: 31 mmol/L (ref 22–32)
Calcium: 9.7 mg/dL (ref 8.9–10.3)
Chloride: 103 mmol/L (ref 98–111)
Creatinine: 0.81 mg/dL (ref 0.61–1.24)
GFR, Estimated: >60 mL/min (ref >60)
Glucose, Bld: 69 mg/dL — ABNORMAL LOW (ref 70–99)
Potassium: 4.2 mmol/L (ref 3.5–5.1)
Sodium: 140 mmol/L (ref 135–145)
Total Bilirubin: 0.6 mg/dL (ref 0.3–1.2)
Total Protein: 5.9 g/dL — ABNORMAL LOW (ref 6.5–8.1)

## 2020-10-29 LAB — LACTATE DEHYDROGENASE: LDH: 162 U/L (ref 98–192)

## 2020-10-29 MED ORDER — DIPHENHYDRAMINE HCL 25 MG PO CAPS: 50.0000 mg | ORAL_CAPSULE | Freq: Once | ORAL | Status: DC

## 2020-10-29 MED ORDER — DARATUMUMAB-HYALURONIDASE-FIHJ 1800-30000 MG-UT/15ML ~~LOC~~ SOLN
1800.0000 mg | Freq: Once | SUBCUTANEOUS | Status: AC
  Administered 2020-10-29: 1800 mg via SUBCUTANEOUS
  Filled 2020-10-29: qty 15

## 2020-10-29 MED ORDER — ACETAMINOPHEN 325 MG PO TABS: 650.0000 mg | ORAL_TABLET | Freq: Once | ORAL | Status: DC

## 2020-10-29 MED ORDER — DEXAMETHASONE 4 MG PO TABS: 20.0000 mg | ORAL_TABLET | Freq: Once | ORAL | Status: DC

## 2020-10-29 NOTE — Telephone Encounter (Signed)
Appointments were already per 12/10 los

## 2020-10-29 NOTE — Progress Notes (Signed)
Reviewed pt labs with Dr. Marin Olp and pt ok to treat with platelets 92  Pt discharged in no apparent distress. Pt left ambulatory without assistance.  Pt aware of discharge instructions and verbalized understanding and had no further questions.

## 2020-10-29 NOTE — Patient Instructions (Signed)
Daratumumab injection What is this medicine? DARATUMUMAB (dar a toom ue mab) is a monoclonal antibody. It is used to treat multiple myeloma. This medicine may be used for other purposes; ask your health care provider or pharmacist if you have questions. COMMON BRAND NAME(S): DARZALEX What should I tell my health care provider before I take this medicine? They need to know if you have any of these conditions:  infection (especially a virus infection such as chickenpox, herpes, or hepatitis B virus)  lung or breathing disease  an unusual or allergic reaction to daratumumab, other medicines, foods, dyes, or preservatives  pregnant or trying to get pregnant  breast-feeding How should I use this medicine? This medicine is for infusion into a vein. It is given by a health care professional in a hospital or clinic setting. Talk to your pediatrician regarding the use of this medicine in children. Special care may be needed. Overdosage: If you think you have taken too much of this medicine contact a poison control center or emergency room at once. NOTE: This medicine is only for you. Do not share this medicine with others. What if I miss a dose? Keep appointments for follow-up doses as directed. It is important not to miss your dose. Call your doctor or health care professional if you are unable to keep an appointment. What may interact with this medicine? Interactions have not been studied. This list may not describe all possible interactions. Give your health care provider a list of all the medicines, herbs, non-prescription drugs, or dietary supplements you use. Also tell them if you smoke, drink alcohol, or use illegal drugs. Some items may interact with your medicine. What should I watch for while using this medicine? This drug may make you feel generally unwell. Report any side effects. Continue your course of treatment even though you feel ill unless your doctor tells you to stop. This  medicine can cause serious allergic reactions. To reduce your risk you may need to take medicine before treatment with this medicine. Take your medicine as directed. This medicine can affect the results of blood tests to match your blood type. These changes can last for up to 6 months after the final dose. Your healthcare provider will do blood tests to match your blood type before you start treatment. Tell all of your healthcare providers that you are being treated with this medicine before receiving a blood transfusion. This medicine can affect the results of some tests used to determine treatment response; extra tests may be needed to evaluate response. Do not become pregnant while taking this medicine or for 3 months after stopping it. Women should inform their doctor if they wish to become pregnant or think they might be pregnant. There is a potential for serious side effects to an unborn child. Talk to your health care professional or pharmacist for more information. What side effects may I notice from receiving this medicine? Side effects that you should report to your doctor or health care professional as soon as possible:  allergic reactions like skin rash, itching or hives, swelling of the face, lips, or tongue  breathing problems  chills  cough  dizziness  feeling faint or lightheaded  headache  low blood counts - this medicine may decrease the number of white blood cells, red blood cells and platelets. You may be at increased risk for infections and bleeding.  nausea, vomiting  shortness of breath  signs of decreased platelets or bleeding - bruising, pinpoint red spots on  the skin, black, tarry stools, blood in the urine  signs of decreased red blood cells - unusually weak or tired, feeling faint or lightheaded, falls  signs of infection - fever or chills, cough, sore throat, pain or difficulty passing urine  signs and symptoms of liver injury like dark yellow or brown  urine; general ill feeling or flu-like symptoms; light-colored stools; loss of appetite; right upper belly pain; unusually weak or tired; yellowing of the eyes or skin Side effects that usually do not require medical attention (report to your doctor or health care professional if they continue or are bothersome):  back pain  constipation  diarrhea  joint pain  muscle cramps  pain, tingling, numbness in the hands or feet  swelling of the ankles, feet, hands  tiredness  trouble sleeping This list may not describe all possible side effects. Call your doctor for medical advice about side effects. You may report side effects to FDA at 1-800-FDA-1088. Where should I keep my medicine? This drug is given in a hospital or clinic and will not be stored at home. NOTE: This sheet is a summary. It may not cover all possible information. If you have questions about this medicine, talk to your doctor, pharmacist, or health care provider.  2020 Elsevier/Gold Standard (2019-07-15 18:10:54) Zoledronic Acid injection (Hypercalcemia, Oncology) What is this medicine? ZOLEDRONIC ACID (ZOE le dron ik AS id) lowers the amount of calcium loss from bone. It is used to treat too much calcium in your blood from cancer. It is also used to prevent complications of cancer that has spread to the bone. This medicine may be used for other purposes; ask your health care provider or pharmacist if you have questions. COMMON BRAND NAME(S): Zometa What should I tell my health care provider before I take this medicine? They need to know if you have any of these conditions:  aspirin-sensitive asthma  cancer, especially if you are receiving medicines used to treat cancer  dental disease or wear dentures  infection  kidney disease  receiving corticosteroids like dexamethasone or prednisone  an unusual or allergic reaction to zoledronic acid, other medicines, foods, dyes, or preservatives  pregnant or trying to  get pregnant  breast-feeding How should I use this medicine? This medicine is for infusion into a vein. It is given by a health care professional in a hospital or clinic setting. Talk to your pediatrician regarding the use of this medicine in children. Special care may be needed. Overdosage: If you think you have taken too much of this medicine contact a poison control center or emergency room at once. NOTE: This medicine is only for you. Do not share this medicine with others. What if I miss a dose? It is important not to miss your dose. Call your doctor or health care professional if you are unable to keep an appointment. What may interact with this medicine?  certain antibiotics given by injection  NSAIDs, medicines for pain and inflammation, like ibuprofen or naproxen  some diuretics like bumetanide, furosemide  teriparatide  thalidomide This list may not describe all possible interactions. Give your health care provider a list of all the medicines, herbs, non-prescription drugs, or dietary supplements you use. Also tell them if you smoke, drink alcohol, or use illegal drugs. Some items may interact with your medicine. What should I watch for while using this medicine? Visit your doctor or health care professional for regular checkups. It may be some time before you see the benefit from this   medicine. Do not stop taking your medicine unless your doctor tells you to. Your doctor may order blood tests or other tests to see how you are doing. Women should inform their doctor if they wish to become pregnant or think they might be pregnant. There is a potential for serious side effects to an unborn child. Talk to your health care professional or pharmacist for more information. You should make sure that you get enough calcium and vitamin D while you are taking this medicine. Discuss the foods you eat and the vitamins you take with your health care professional. Some people who take this  medicine have severe bone, joint, and/or muscle pain. This medicine may also increase your risk for jaw problems or a broken thigh bone. Tell your doctor right away if you have severe pain in your jaw, bones, joints, or muscles. Tell your doctor if you have any pain that does not go away or that gets worse. Tell your dentist and dental surgeon that you are taking this medicine. You should not have major dental surgery while on this medicine. See your dentist to have a dental exam and fix any dental problems before starting this medicine. Take good care of your teeth while on this medicine. Make sure you see your dentist for regular follow-up appointments. What side effects may I notice from receiving this medicine? Side effects that you should report to your doctor or health care professional as soon as possible:  allergic reactions like skin rash, itching or hives, swelling of the face, lips, or tongue  anxiety, confusion, or depression  breathing problems  changes in vision  eye pain  feeling faint or lightheaded, falls  jaw pain, especially after dental work  mouth sores  muscle cramps, stiffness, or weakness  redness, blistering, peeling or loosening of the skin, including inside the mouth  trouble passing urine or change in the amount of urine Side effects that usually do not require medical attention (report to your doctor or health care professional if they continue or are bothersome):  bone, joint, or muscle pain  constipation  diarrhea  fever  hair loss  irritation at site where injected  loss of appetite  nausea, vomiting  stomach upset  trouble sleeping  trouble swallowing  weak or tired This list may not describe all possible side effects. Call your doctor for medical advice about side effects. You may report side effects to FDA at 1-800-FDA-1088. Where should I keep my medicine? This drug is given in a hospital or clinic and will not be stored at  home. NOTE: This sheet is a summary. It may not cover all possible information. If you have questions about this medicine, talk to your doctor, pharmacist, or health care provider.  2020 Elsevier/Gold Standard (2014-04-04 14:19:39)

## 2020-10-29 NOTE — Progress Notes (Signed)
Hematology and Oncology Follow Up Visit  Danny Juarez [id number] 1956-02-03 64 y.o. 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   Completed Therapy: 01/09/2019 - 05/08/2019: 1st line q21day RVd x 6 cycles, PR 04/08/2019: IMNP for left humerus fracture  05/29/2019 - 07/17/2019: 2nd line q28day CyBorD x 2 cycles, PD  07/29/2019 - 09/09/2019: 3rd line DPd; q28day cycle x 2 cycles  10/14/2019: melphalan, followed by auto-SCT at [facility]; Danny Juarez  Current Therapy: Maintenance Daratumumab (monthly) with Pomalidomide 4m (3 weeks on/1 week off)  -- Pomalyst on hold due to cytopenia Zometa every 3 months; resumed in 12/2019 post-transplant, plan for 2 years  -- next dose 11/2020   Interim History:  Danny Juarez here today for follow-up and treatment.  His blood counts are coming up slowly but surely.  He is still off the pomalidomide.  He probably has been off pomalidomide for about 3 months or so.  He feels well.  He has had no fever.  He has had no cough or shortness of breath.  He has had no change in bowel or bladder habits.  His myeloma studies have all looked okay.  November, the monoclonal spike was 0.1 g/dL.  The IgG level was 500 mg/dL.  His lambda light chain was 1 mg/dL.  All this was holding steady.  If we find that the monoclonal spike is higher, we may have to go ahead and add back the pomalidomide.  He had a very nice Thanksgiving.  He ate well.  He is exercising quite often.  He has had no change in bowel or bladder habits.  He has had no rashes.  He has had no fever.  There is been no cough or shortness of breath.   Overall, his performance status is probably ECOG 1.    Medications:  Allergies as of 10/29/2020   No Known Allergies     Medication List       Accurate as of October 29, 2020  1:41 PM. If you have any questions, ask your  nurse or doctor.        acyclovir 800 MG tablet Commonly known as: ZOVIRAX Take 800 mg by mouth 2 (two) times daily.   CALCIUM-VITAMIN D PO Take 1 tablet by mouth daily.   dexamethasone 4 MG tablet Commonly known as: DECADRON Take five tablets (20 mg) at home on day of chemo treatment.   ferrous sulfate 325 (65 FE) MG tablet Take 325 mg by mouth daily with breakfast.   folic acid 1 MG tablet Commonly known as: FOLVITE Take 1 mg by mouth daily.   ibuprofen 200 MG tablet Commonly known as: ADVIL Take 400 mg by mouth daily as needed (hip pain.).   multivitamin with minerals Tabs tablet Take 1 tablet by mouth daily.       Allergies: No Known Allergies  Past Medical History, Surgical history, Social history, and Family History were reviewed and updated.  Review of Systems: Review of Systems  Constitutional: Negative.   HENT: Negative.   Eyes: Negative.   Respiratory: Negative.   Cardiovascular: Negative.   Gastrointestinal: Negative.   Genitourinary: Negative.   Musculoskeletal: Positive for back pain and joint pain.  Skin: Negative.   Neurological: Negative.   Endo/Heme/Allergies: Negative.   Psychiatric/Behavioral: Negative.    .Marland Kitchen  Physical Exam:  weight is 140 lb 12.8 oz (63.9 kg). His oral temperature  is 98.4 F (36.9 C). His blood pressure is 111/71 and his pulse is 61. His respiration is 20 and oxygen saturation is 100%.   Wt Readings from Last 3 Encounters:  10/29/20 140 lb 12.8 oz (63.9 kg)  10/01/20 139 lb (63 kg)  09/03/20 137 lb (62.1 kg)    Physical Exam Vitals reviewed.  HENT:     Head: Normocephalic and atraumatic.  Eyes:     Pupils: Pupils are equal, round, and reactive to light.  Cardiovascular:     Rate and Rhythm: Normal rate and regular rhythm.     Heart sounds: Normal heart sounds.  Pulmonary:     Effort: Pulmonary effort is normal.     Breath sounds: Normal breath sounds.  Abdominal:     General: Bowel sounds are normal.      Palpations: Abdomen is soft.  Musculoskeletal:        General: No tenderness or deformity. Normal range of motion.     Cervical back: Normal range of motion.     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  He has swelling both knees.  He has decreased range of motion of the knees.  He has some tenderness to palpation over his right hip.  Lymphadenopathy:     Cervical: No cervical adenopathy.  Skin:    General: Skin is warm and dry.     Findings: No erythema or rash.  Neurological:     Mental Status: He is alert and oriented to person, place, and time.  Psychiatric:        Behavior: Behavior normal.        Thought Content: Thought content normal.        Judgment: Judgment normal.    Lab Results  Component Value Date   WBC 3.3 (L) 10/29/2020   HGB 12.5 (L) 10/29/2020   HCT 35.2 (L) 10/29/2020   MCV 97.0 10/29/2020   PLT 92 (L) 10/29/2020   No results found for: FERRITIN, IRON, TIBC, UIBC, IRONPCTSAT Lab Results  Component Value Date   RBC 3.63 (L) 10/29/2020   Lab Results  Component Value Date   KPAFRELGTCHN 12.8 10/01/2020   LAMBDASER 10.1 10/01/2020   KAPLAMBRATIO 1.27 10/01/2020   Lab Results  Component Value Date   IGGSERUM 548 (L) 10/01/2020   IGA 14 (L) 10/01/2020   IGMSERUM 33 10/01/2020   Lab Results  Component Value Date   TOTALPROTELP 5.6 (L) 10/01/2020   ALBUMINELP 3.6 10/01/2020   A1GS 0.2 10/01/2020   A2GS 0.5 10/01/2020   BETS 0.8 10/01/2020   GAMS 0.5 10/01/2020   MSPIKE 0.1 (H) 10/01/2020   SPEI Comment 08/06/2020     Chemistry      Component Value Date/Time   NA 140 10/29/2020 1201   NA 137 04/16/2020 0846   K 4.2 10/29/2020 1201   CL 103 10/29/2020 1201   CO2 31 10/29/2020 1201   BUN 24 (H) 10/29/2020 1201   BUN 19 04/16/2020 0846   CREATININE 0.81 10/29/2020 1201      Component Value Date/Time   CALCIUM 9.7 10/29/2020 1201   ALKPHOS 52 10/29/2020 1201   AST 29 10/29/2020 1201   ALT 27 10/29/2020 1201   BILITOT 0.6  10/29/2020 1201       Impression and Plan:   Danny Juarez is a very pleasant 64 yo caucasian gentleman with 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 and DS.   He is s/p autologous stem cell transplant at [facility] 09/2019.  I would still hold his pomalidomide.  Hopefully, when his platelet count gets above 100,000, we might be able to restart the pomalidomide.  Overall, I really think that he is doing quite well. We will continue on the monthly Darzalex/Faspro.  We will plan to get him back in 1 more month. We might be able to adjust his schedule according to the holiday season.  Volanda Napoleon, MD 12/10/20211:41 PM

## 2020-10-31 LAB — IGG, IGA, IGM
IgA: 10 mg/dL — ABNORMAL LOW (ref 61–437)
IgG (Immunoglobin G), Serum: 432 mg/dL — ABNORMAL LOW (ref 603–1613)
IgM (Immunoglobulin M), Srm: 24 mg/dL (ref 20–172)

## 2020-10-31 IMAGING — CR LEFT HUMERUS - 2+ VIEW
2 series · 2 of 2 positions shown · non-contrast
Comparison: None.

CLINICAL DATA: Left upper arm pain.  History of multiple myeloma.

EXAM:
LEFT HUMERUS - 2+ VIEW

[w humerus ap left]
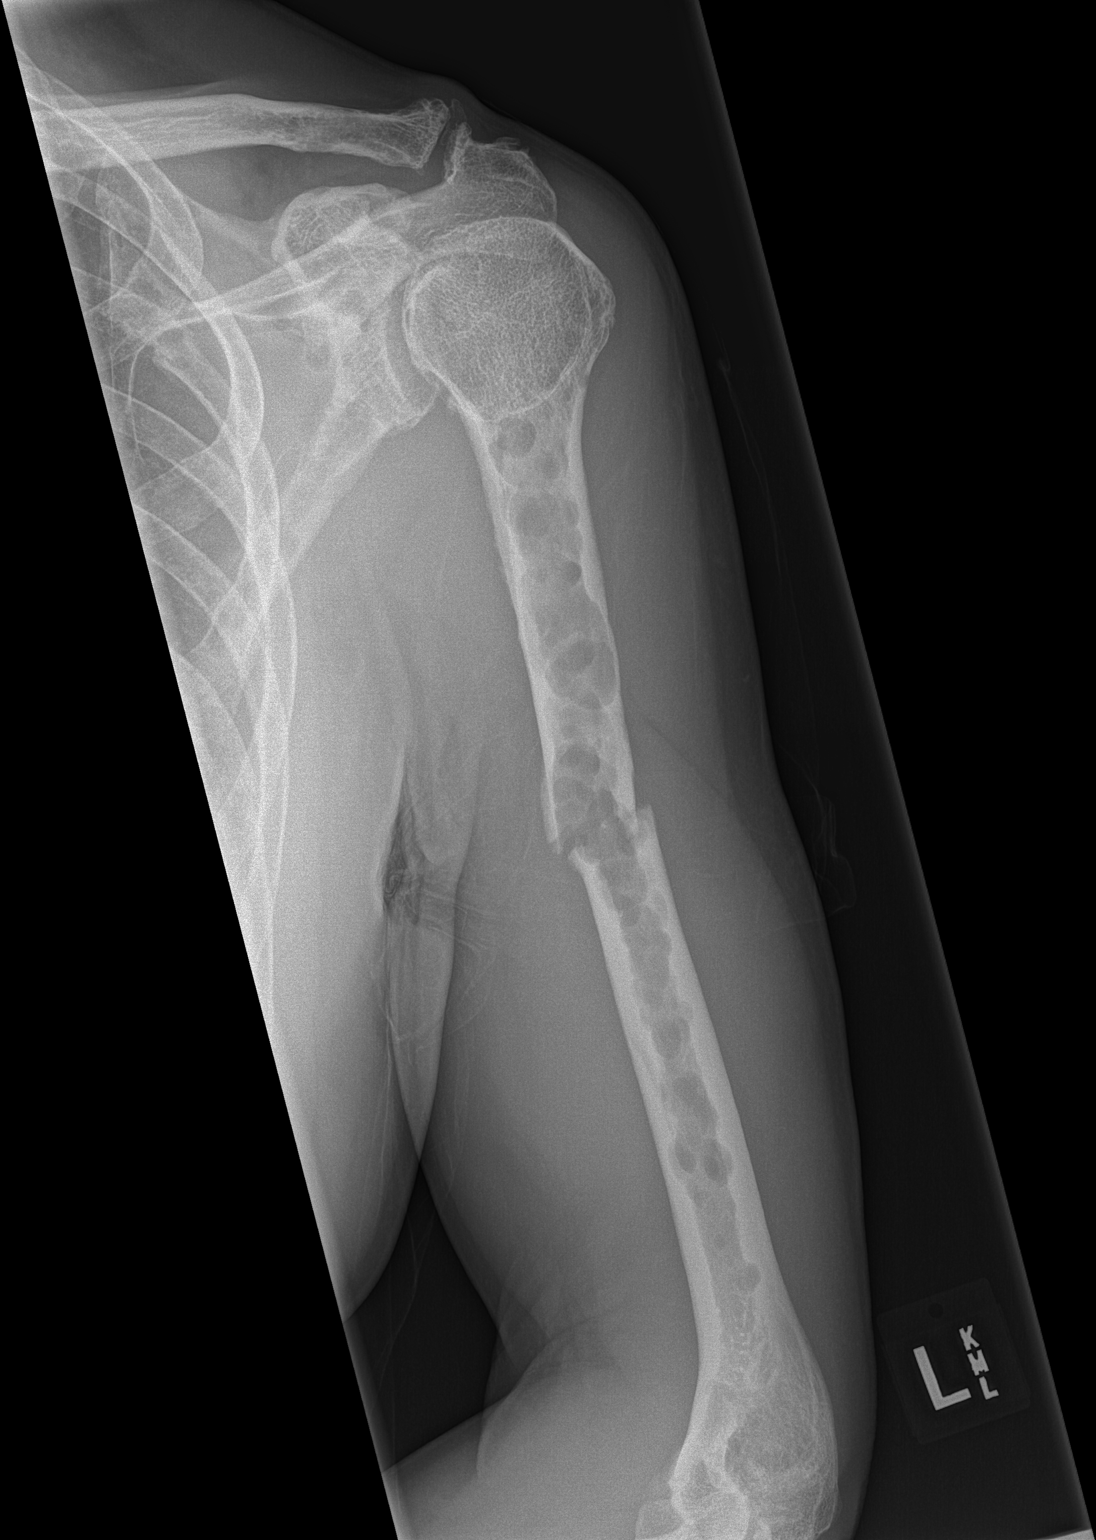

[w humerus lat left]
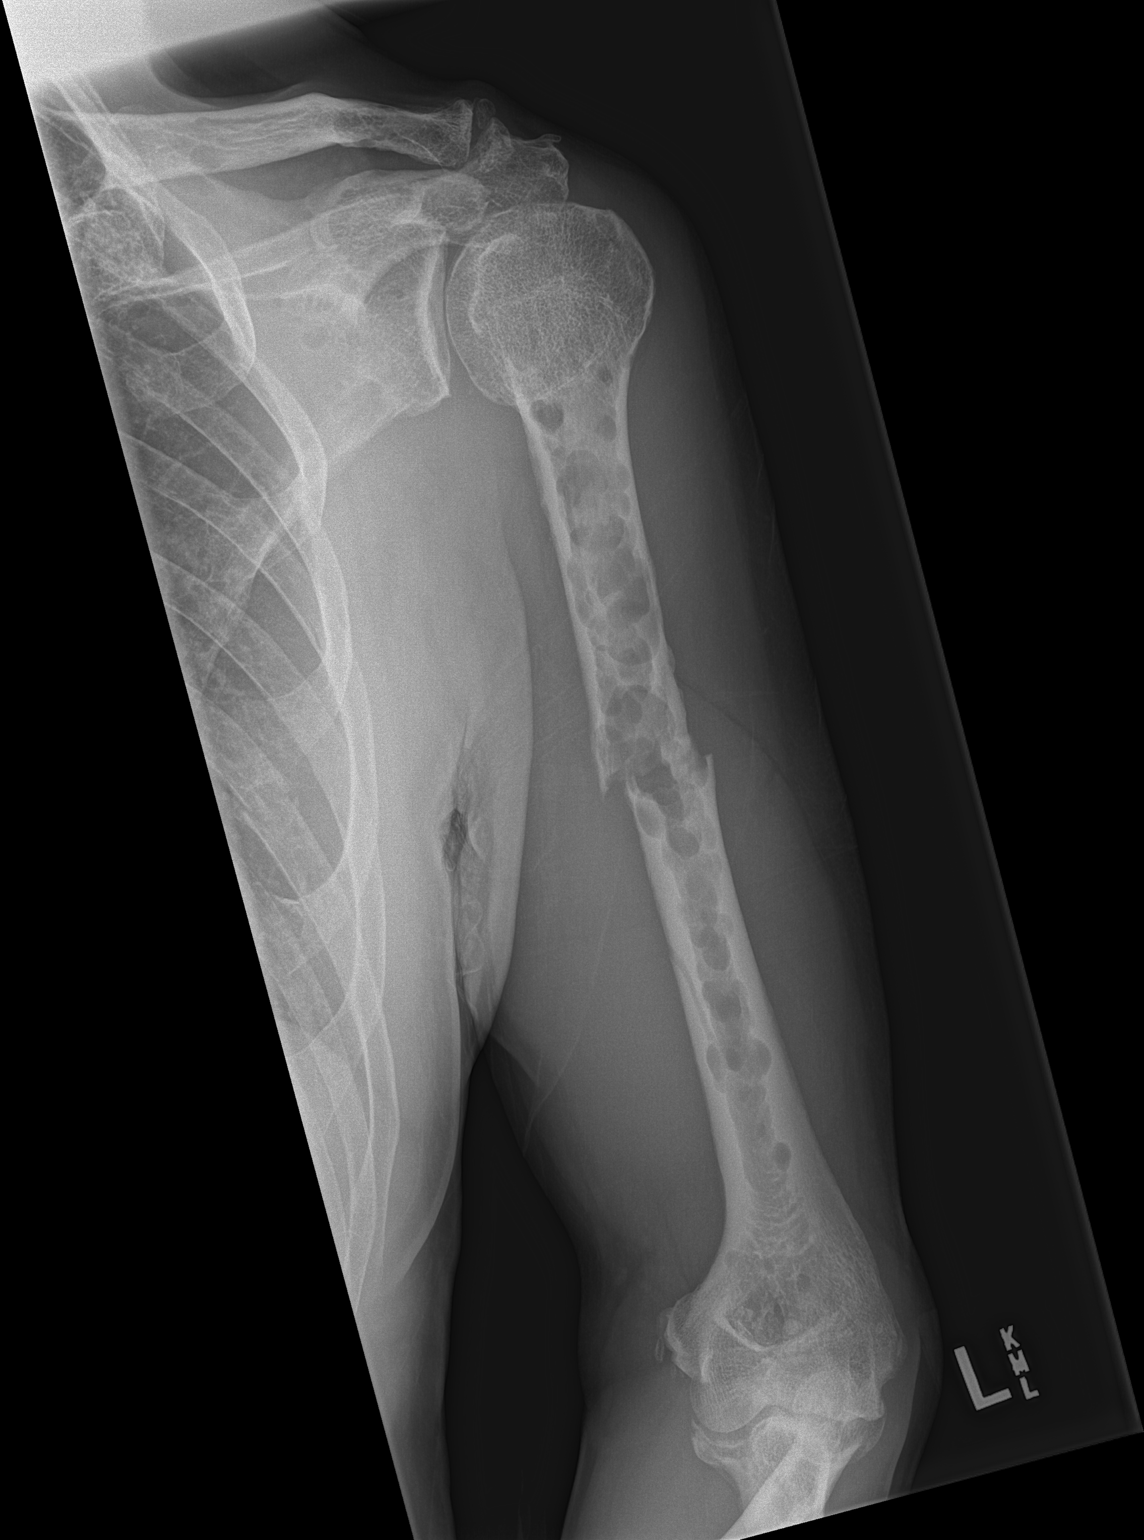

[2 of 2 positions shown; findings below may reference images not displayed]

FINDINGS: Mildly displaced pathologic fracture seen involving the midshaft of
the left humerus. Innumerable rounded lucencies are seen throughout
the left humerus and other visualized skeletal structures consistent
with history of multiple myeloma.
IMPRESSION: Mildly displaced pathologic fracture involving midshaft of left
humerus. Findings consistent with multiple myeloma.

## 2020-11-01 LAB — PROTEIN ELECTROPHORESIS, SERUM, WITH REFLEX
A/G Ratio: 1.6 (ref 0.7–1.7)
Albumin ELP: 3.6 g/dL (ref 2.9–4.4)
Alpha-1-Globulin: 0.3 g/dL (ref 0.0–0.4)
Alpha-2-Globulin: 0.6 g/dL (ref 0.4–1.0)
Beta Globulin: 0.9 g/dL (ref 0.7–1.3)
Gamma Globulin: 0.5 g/dL (ref 0.4–1.8)
Globulin, Total: 2.3 g/dL (ref 2.2–3.9)
Total Protein ELP: 5.9 g/dL — ABNORMAL LOW (ref 6.0–8.5)

## 2020-11-01 LAB — KAPPA/LAMBDA LIGHT CHAINS
Kappa free light chain: 11.1 mg/L (ref 3.3–19.4)
Kappa, lambda light chain ratio: 1.22 (ref 0.26–1.65)
Lambda free light chains: 9.1 mg/L (ref 5.7–26.3)

## 2020-11-26 ENCOUNTER — Inpatient Hospital Stay: Payer: 59 | Admitting: Family

## 2020-11-26 ENCOUNTER — Inpatient Hospital Stay: Payer: 59

## 2020-11-26 ENCOUNTER — Inpatient Hospital Stay: Payer: 59 | Attending: Hematology

## 2021-02-02 ENCOUNTER — Telehealth: Payer: Self-pay

## 2021-02-02 NOTE — Telephone Encounter (Signed)
Called pt and left a vm to call and r/s his no show appts as his chemo ins [name] will expire 02/15/21   Danny Juarez

## 2021-02-04 ENCOUNTER — Telehealth: Payer: Self-pay | Admitting: *Deleted

## 2021-02-04 NOTE — Telephone Encounter (Signed)
lvm for patient to call back to reschedule appointments missed

## 2021-02-10 ENCOUNTER — Inpatient Hospital Stay: Payer: Self-pay | Admitting: Family

## 2021-02-10 ENCOUNTER — Inpatient Hospital Stay: Payer: Self-pay

## 2021-03-08 IMAGING — XA IR FLUORO GUIDE CV LINE*L*
1 series · 1 of 1 positions shown · non-contrast
Comparison: None.

INDICATION: 63-year-old with multiple myeloma. Port-A-Cath needed for treatment.

EXAM:
FLUOROSCOPIC AND ULTRASOUND GUIDED PLACEMENT OF A SUBCUTANEOUS PORT

[Series 300: ir imaging guided port insertion · 1 of 1 slices shown]
[im 1/1]
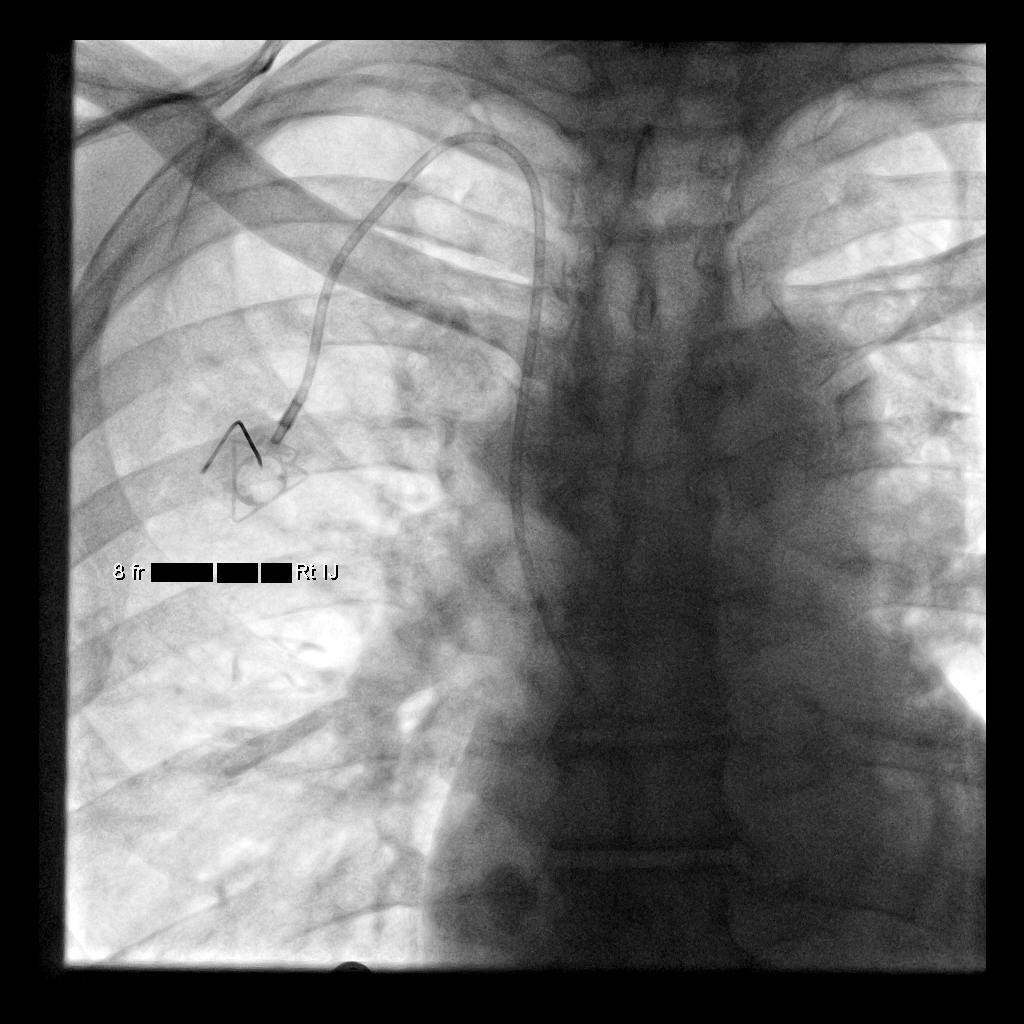

[1 of 1 positions shown; findings below may reference images not displayed]

MEDICATIONS:
Ancef 2 g; The antibiotic was administered within an appropriate
time interval prior to skin puncture.

ANESTHESIA/SEDATION:
Versed 2.0 mg IV; Fentanyl 100 mcg IV;

Moderate Sedation Time:  29 minutes

The patient was continuously monitored during the procedure by the
interventional radiology nurse under my direct supervision.

FLUOROSCOPY TIME:  24 seconds, 2 mGy

COMPLICATIONS:
None immediate.

PROCEDURE:
The procedure, risks, benefits, and alternatives were explained to
the patient. Questions regarding the procedure were encouraged and
answered. The patient understands and consents to the procedure.

Patient was placed supine on the interventional table. Ultrasound
confirmed a patent right internal jugular vein. Ultrasound image was
saved for documentation. The right chest and neck were cleaned with
a skin antiseptic and a sterile drape was placed. Maximal barrier
sterile technique was utilized including caps, mask, sterile gowns,
sterile gloves, sterile drape, hand hygiene and skin antiseptic. The
right neck was anesthetized with 1% lidocaine. Small incision was
made in the right neck with a blade. Micropuncture set was placed in
the right internal jugular vein with ultrasound guidance. The
micropuncture wire was used for measurement purposes. The right
chest was anesthetized with 1% lidocaine with epinephrine. #15 blade
was used to make an incision and a subcutaneous port pocket was
formed. 8 french Power Port was assembled. Subcutaneous tunnel was
formed with a stiff tunneling device. The port catheter was brought
through the subcutaneous tunnel. The port was placed in the
subcutaneous pocket. The micropuncture set was exchanged for a
peel-away sheath. The catheter was placed through the peel-away
sheath and the tip was positioned at the SVC and right atrium
junction. Catheter placement was confirmed with fluoroscopy. The
port was accessed and flushed with heparinized saline. The port
pocket was closed using two layers of absorbable sutures and
Dermabond. The vein skin site was closed using a single layer of
absorbable suture and Dermabond. Sterile dressings were applied.
Patient tolerated the procedure well without an immediate
complication. Ultrasound and fluoroscopic images were taken and
saved for this procedure.
IMPRESSION: Placement of a subcutaneous port device. Catheter tip at the SVC and
right atrium junction.

## 2021-03-08 IMAGING — US IR FLUORO GUIDE CV LINE*L*
1 series · 1 of 1 positions shown · non-contrast
Comparison: None.

INDICATION: 63-year-old with multiple myeloma. Port-A-Cath needed for treatment.

EXAM:
FLUOROSCOPIC AND ULTRASOUND GUIDED PLACEMENT OF A SUBCUTANEOUS PORT

[Series 1: ir fluoro/shunt/fist · 1 of 1 slices shown]
[im 1/1]
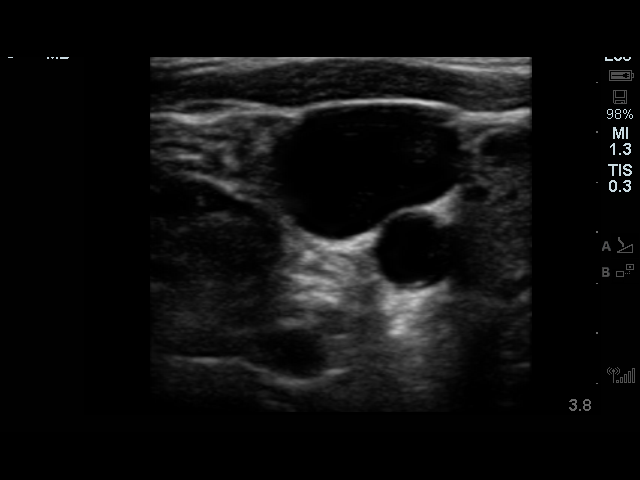

[1 of 1 positions shown; findings below may reference images not displayed]

MEDICATIONS:
Ancef 2 g; The antibiotic was administered within an appropriate
time interval prior to skin puncture.

ANESTHESIA/SEDATION:
Versed 2.0 mg IV; Fentanyl 100 mcg IV;

Moderate Sedation Time:  29 minutes

The patient was continuously monitored during the procedure by the
interventional radiology nurse under my direct supervision.

FLUOROSCOPY TIME:  24 seconds, 2 mGy

COMPLICATIONS:
None immediate.

PROCEDURE:
The procedure, risks, benefits, and alternatives were explained to
the patient. Questions regarding the procedure were encouraged and
answered. The patient understands and consents to the procedure.

Patient was placed supine on the interventional table. Ultrasound
confirmed a patent right internal jugular vein. Ultrasound image was
saved for documentation. The right chest and neck were cleaned with
a skin antiseptic and a sterile drape was placed. Maximal barrier
sterile technique was utilized including caps, mask, sterile gowns,
sterile gloves, sterile drape, hand hygiene and skin antiseptic. The
right neck was anesthetized with 1% lidocaine. Small incision was
made in the right neck with a blade. Micropuncture set was placed in
the right internal jugular vein with ultrasound guidance. The
micropuncture wire was used for measurement purposes. The right
chest was anesthetized with 1% lidocaine with epinephrine. #15 blade
was used to make an incision and a subcutaneous port pocket was
formed. 8 french Power Port was assembled. Subcutaneous tunnel was
formed with a stiff tunneling device. The port catheter was brought
through the subcutaneous tunnel. The port was placed in the
subcutaneous pocket. The micropuncture set was exchanged for a
peel-away sheath. The catheter was placed through the peel-away
sheath and the tip was positioned at the SVC and right atrium
junction. Catheter placement was confirmed with fluoroscopy. The
port was accessed and flushed with heparinized saline. The port
pocket was closed using two layers of absorbable sutures and
Dermabond. The vein skin site was closed using a single layer of
absorbable suture and Dermabond. Sterile dressings were applied.
Patient tolerated the procedure well without an immediate
complication. Ultrasound and fluoroscopic images were taken and
saved for this procedure.
IMPRESSION: Placement of a subcutaneous port device. Catheter tip at the SVC and
right atrium junction.

## 2021-06-28 DIAGNOSIS — L089 Local infection of the skin and subcutaneous tissue, unspecified: Secondary | ICD-10-CM | POA: Diagnosis not present

## 2021-06-28 DIAGNOSIS — S50811A Abrasion of right forearm, initial encounter: Secondary | ICD-10-CM | POA: Diagnosis not present

## 2021-06-28 DIAGNOSIS — W540XXA Bitten by dog, initial encounter: Secondary | ICD-10-CM | POA: Diagnosis not present

## 2021-11-29 DIAGNOSIS — M25551 Pain in right hip: Secondary | ICD-10-CM | POA: Diagnosis not present

## 2021-11-29 DIAGNOSIS — Z8579 Personal history of other malignant neoplasms of lymphoid, hematopoietic and related tissues: Secondary | ICD-10-CM | POA: Diagnosis not present

## 2021-11-29 DIAGNOSIS — M87851 Other osteonecrosis, right femur: Secondary | ICD-10-CM | POA: Diagnosis not present

## 2021-12-27 DIAGNOSIS — M8700 Idiopathic aseptic necrosis of unspecified bone: Secondary | ICD-10-CM | POA: Diagnosis not present

## 2021-12-27 DIAGNOSIS — M87050 Idiopathic aseptic necrosis of pelvis: Secondary | ICD-10-CM | POA: Diagnosis not present

## 2021-12-27 DIAGNOSIS — M47898 Other spondylosis, sacral and sacrococcygeal region: Secondary | ICD-10-CM | POA: Diagnosis not present

## 2021-12-27 DIAGNOSIS — M87051 Idiopathic aseptic necrosis of right femur: Secondary | ICD-10-CM | POA: Diagnosis not present

## 2021-12-27 DIAGNOSIS — M8588 Other specified disorders of bone density and structure, other site: Secondary | ICD-10-CM | POA: Diagnosis not present

## 2021-12-27 DIAGNOSIS — M160 Bilateral primary osteoarthritis of hip: Secondary | ICD-10-CM | POA: Diagnosis not present

## 2022-03-23 NOTE — Progress Notes (Signed)
?Cardiology Clinic Note  ? ?Patient Name: Danny Juarez ?Date of Encounter: 03/24/2022 ? ?Primary Care Provider:  Patient, No Pcp Per (Inactive) ?Primary Cardiologist:  Donato Heinz, MD ? ?Patient Profile  ?  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   ? ?Last seen by Dr. Nechama Guard on 04/09/2020 with complaints of ongoing chest pain described as a dull aching pain on the right side of his chest, occurs with exertion resolving with rest.  It was determined at that time that his chest discomfort was noncardiac in etiology.  At that time he had also returned to normal sinus rhythm but was to continue Eliquis uninterrupted for 1 month. ? ?Past Medical History  ?  ?Past Medical History:  ?Diagnosis Date  ? Anemia   ? due to chemo   ? Cancer [facility])   ? Multiple myeloma with  Bone Mets  ? Leukopenia   ? chemo related  ? PE (pulmonary embolism) 1996  ?  "3 in right lung"  ? 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 ?Past Surgical History:  ?Procedure Laterality Date  ? CARDIOVERSION N/A 03/29/2020  ? Procedure: CARDIOVERSION;  Surgeon: Thayer Headings, MD;  Location: [place];  Service: Cardiovascular;  Laterality: N/A;  ? HUMERUS IM NAIL Left 04/08/2019  ? Procedure: INTRAMEDULLARY (IM) NAIL HUMERUS;  Surgeon: Nicholes Stairs, MD;  Location: [place];  Service: Orthopedics;  Laterality: Left;  120 mins  ? IR IMAGING GUIDED PORT INSERTION  07/25/2019  ? IR REMOVAL TUN ACCESS W/ PORT W/O FL MOD SED  06/29/2020  ? KNEE ARTHROSCOPY  Left   ? ? ?Allergies ? ?No Known Allergies ? ?History of Present Illness  ?  ?Danny Juarez presents today for ongoing assessment and management of chronic chest pain,not thought to be cardiac in etiology,  NICM, (EF of 35%-40%) while in atrial flutter in 2021, remote history of PE/DVT, PAF, no longer on anticoagulation.  ? ?He has not been seen since 2021.  He is now complaining of right-sided chest pressure especially with exertion having to stop while walking his dogs going up an incline.  He has had some mild dizziness, denies any rapid heart rhythm, significant shortness of breath, or fatigue.  He states that the chest discomfort feels like "pressure" under the right pectoral area, and sometimes he feels some achiness in both arms.  Discomfort goes away with rest.  This is similar to what he was experiencing in 2021. ? ?Of note, he is no longer being treated for multiple myeloma is followed cancer, and is now in remission with annual follow-ups with oncology.  He is being seen by the [facility] in [place] and has had labs drawn on Mar 22, 2022. He has also been diagnosed with vascular necrosis of the right hip.  ? ?Review of labs reveals creatinine 0.81, hemoglobin 12.0 hematocrit 33.2 platelets 159.  These are gleaned from Care Everywhere. ? ?Home Medications  ?  ?Current Outpatient Medications  ?Medication Sig Dispense Refill  ? CALCIUM-VITAMIN D PO Take 1 tablet by mouth daily.     ?  ferrous sulfate 325 (65 FE) MG tablet Take 325 mg by mouth daily with breakfast.    ? folic acid (FOLVITE) 1 MG tablet Take 1 mg by mouth daily.     ? ibuprofen (ADVIL) 200 MG tablet Take 400 mg by mouth daily as needed (hip pain.).    ? Multiple Vitamin (MULTIVITAMIN WITH MINERALS) TABS Take 1 tablet by mouth daily.    ? pomalidomide (POMALYST) 1 MG capsule Take by mouth.    ? ?No current facility-administered medications for this visit.  ? ?Facility-Administered Medications Ordered in Other Visits  ?Medication Dose Route  Frequency Provider Last Rate Last Admin  ? sodium chloride flush (NS) 0.9 % injection 10 mL  10 mL Intravenous PRN [name], MD   10 mL at 02/12/20 8588  ?  ? ?Family History  ?  ?No family history on file. ?He indicated that his mother is deceased. He indicated that his father is deceased. ? ?Social History  ?  ?Social History  ? ?Socioeconomic History  ? Marital status: Divorced  ?  Spouse name: Not on file  ? Number of children: 1  ? Years of education: Not on file  ? Highest education level: Not on file  ?Occupational History  ? Not on file  ?Tobacco Use  ? Smoking status: Never  ? Smokeless tobacco: Never  ?Vaping Use  ? Vaping Use: Never used  ?Substance and Sexual Activity  ? Alcohol use: Yes  ?  Comment: occassional  ? Drug use: No  ? Sexual activity: Not on file  ?Other Topics Concern  ? Not on file  ?Social History Narrative  ? Patient is divorced with one daughter that lives in [place], [place].  ? Patient has never smoked nor used smokeless tobacco.  ? Patient with occasional use of alcohol.  ? Patient denies use of illicit drugs.  ? Patient is a retired Set designer.  ? ?Social Determinants of Health  ? ?Financial Resource Strain: Not on file  ?Food Insecurity: Not on file  ?Transportation Needs: Not on file  ?Physical Activity: Not on file  ?Stress: Not on file  ?Social Connections: Not on file  ?Intimate Partner Violence: Not on file  ?  ? ?Review of Systems  ?  ?General:  No chills, fever, night sweats or weight changes.  ?Cardiovascular:  + Chest pressure on the right side, dyspnea on exertion, edema, orthopnea, palpitations, paroxysmal nocturnal dyspnea. ?Dermatological: No rash, lesions/masses ?Respiratory: No cough, dyspnea ?Urologic: No hematuria, dysuria ?Abdominal:   No nausea, vomiting, diarrhea, bright red blood per rectum, melena, or hematemesis ?Neurologic:  No visual changes, wkns, changes in mental status.occasional dizziness. ?All other systems reviewed and are  otherwise negative except as noted above. ? ?  ? ?Physical Exam  ?  ?VS:  BP 112/66   Pulse 83   Ht '5\' 8"'  (1.727 m)   Wt 148 lb 3.2 oz (67.2 kg)   SpO2 96%   BMI 22.53 kg/m?  , BMI Body mass index is 22.53 kg/m?. ?    ?GEN: Well nourished, well developed, in no acute distress. ?HEENT: normal. ?Neck: Supple, no JVD, carotid bruits, or masses. ?Cardiac: IRRR, no murmurs, rubs, or gallops. No clubbing, cyanosis, edema.  Radials/DP/PT 2+ and equal bilaterally.  ?Respiratory:  Respirations regular and unlabored, clear to auscultation bilaterally. ?GI: Soft, nontender, nondistended, BS + x 4. ?MS: no deformity or atrophy. ?Skin: warm and dry, no rash. ?Neuro:  Strength and sensation are intact. ?Psych: Normal  affect. ? ?Accessory Clinical Findings  ?  ?ECG personally reviewed by me today-atrial flutter with variable AV block, 2-1 and 3-1 conduction.  PVCs.  Heart rate of 83 bpm. ?Lab Results  ?Component Value Date  ? WBC 3.3 (L) 10/29/2020  ? HGB 12.5 (L) 10/29/2020  ? HCT 35.2 (L) 10/29/2020  ? MCV 97.0 10/29/2020  ? PLT 92 (L) 10/29/2020  ? ?Lab Results  ?Component Value Date  ? CREATININE 0.81 10/29/2020  ? BUN 24 (H) 10/29/2020  ? NA 140 10/29/2020  ? K 4.2 10/29/2020  ? CL 103 10/29/2020  ? CO2 31 10/29/2020  ? ?Lab Results  ?Component Value Date  ? ALT 27 10/29/2020  ? AST 29 10/29/2020  ? ALKPHOS 52 10/29/2020  ? BILITOT 0.6 10/29/2020  ? ?Lab Results  ?Component Value Date  ? CHOL 152 04/16/2020  ? HDL 56 04/16/2020  ? [place] 80 04/16/2020  ? TRIG 83 04/16/2020  ? CHOLHDL 2.7 04/16/2020  ?  ?No results found for: HGBA1C ? ?Review of Prior Studies: ?TTE 03/03/20: ? 1. Diffuse hypokinesis worse in the inferior wall with abnormal septal  ?motion Patient in atrial flutter throughout the study . Left ventricular  ?ejection fraction, by estimation, is 35 to 40%. The left ventricle has  ?moderately decreased function. The  ?left ventricle demonstrates global hypokinesis. The left ventricular  ?internal cavity size  was mildly dilated. Left ventricular diastolic  ?parameters are indeterminate.  ? 2. Right ventricular systolic function is normal. The right ventricular  ?size is normal. There is normal pulmonary artery systoli

## 2022-03-24 ENCOUNTER — Encounter: Payer: Self-pay | Admitting: Adult Health

## 2022-03-24 ENCOUNTER — Encounter: Payer: Self-pay | Admitting: Hematology

## 2022-03-24 ENCOUNTER — Ambulatory Visit (INDEPENDENT_AMBULATORY_CARE_PROVIDER_SITE_OTHER): Payer: Medicare Other | Admitting: Adult Health

## 2022-03-24 VITALS — BP 112/66 | HR 83 | Ht 68.0 in | Wt 148.2 lb

## 2022-03-24 DIAGNOSIS — I428 Other cardiomyopathies: Secondary | ICD-10-CM | POA: Diagnosis not present

## 2022-03-24 DIAGNOSIS — C9001 Multiple myeloma in remission: Secondary | ICD-10-CM

## 2022-03-24 DIAGNOSIS — Z79899 Other long term (current) drug therapy: Secondary | ICD-10-CM | POA: Diagnosis not present

## 2022-03-24 DIAGNOSIS — I4892 Unspecified atrial flutter: Secondary | ICD-10-CM

## 2022-03-24 MED ORDER — METOPROLOL SUCCINATE ER 25 MG PO TB24: 12.5000 mg | ORAL_TABLET | Freq: Every day | ORAL | 1 refills | Status: DC

## 2022-03-24 MED ORDER — APIXABAN 5 MG PO TABS: 5.0000 mg | ORAL_TABLET | Freq: Two times a day (BID) | ORAL | 2 refills | Status: AC

## 2022-03-24 MED ORDER — APIXABAN 5 MG PO TABS: 5.0000 mg | ORAL_TABLET | Freq: Two times a day (BID) | ORAL | 0 refills | Status: DC

## 2022-03-24 NOTE — Patient Instructions (Addendum)
Medication Instructions:  ?Start Eliquis 5 mg ( Take 1 Tablet Twice Daily). ?Start Metoprolol Succinate 25 mg ( Take Half Tablet Daily 12.5 mg). ?*If you need a refill on your cardiac medications before your next appointment, please call your pharmacy* ? ? ?Lab Work: ?CBC, TSH 1 Week. ?If you have labs (blood work) drawn today and your tests are completely normal, you will receive your results only by: ?MyChart Message (if you have MyChart) OR ?A paper copy in the mail ?If you have any lab test that is abnormal or we need to change your treatment, we will call you to review the results. ? ? ?Testing/Procedures: [address], [address]. ?Your physician has requested that you have an echocardiogram. Echocardiography is a painless test that uses sound waves to create images of your heart. It provides your doctor with information about the size and shape of your heart and how well your heart?s chambers and valves are working. This procedure takes approximately one hour. There are no restrictions for this procedure.  ? ? ?Follow-Up: ?At [facility], you and your health needs are our priority.  As part of our continuing mission to provide you with exceptional heart care, we have created designated Provider Care Teams.  These Care Teams include your primary Cardiologist (physician) and Advanced Practice Providers (APPs -  Physician Assistants and Nurse Practitioners) who all work together to provide you with the care you need, when you need it. ? ?We recommend signing up for the patient portal called "MyChart".  Sign up information is provided on this After Visit Summary.  MyChart is used to connect with patients for Virtual Visits (Telemedicine).  Patients are able to view lab/test results, encounter notes, upcoming appointments, etc.  Non-urgent messages can be sent to your provider as well.   ?To learn more about what you can do with MyChart, go to [email or web address].   ? ?Your next appointment:    ?2-3 week(s) ? ?The format for your next appointment:   ?In Person ? ?Provider:   ?Donato Heinz, MD   ? ? ? ? ?Important Information About [place] ? ? ? ? ?  ?

## 2022-03-27 ENCOUNTER — Encounter: Payer: Self-pay | Admitting: Hematology

## 2022-03-28 ENCOUNTER — Ambulatory Visit (HOSPITAL_COMMUNITY): Payer: Medicare Other | Attending: Cardiology

## 2022-03-28 DIAGNOSIS — Z79899 Other long term (current) drug therapy: Secondary | ICD-10-CM

## 2022-03-28 DIAGNOSIS — I4892 Unspecified atrial flutter: Secondary | ICD-10-CM | POA: Insufficient documentation

## 2022-03-28 LAB — ECHOCARDIOGRAM COMPLETE
Area-P 1/2: 5.08 cm2
S' Lateral: 2.7 cm

## 2022-03-31 ENCOUNTER — Telehealth: Payer: Self-pay

## 2022-03-31 DIAGNOSIS — Z79899 Other long term (current) drug therapy: Secondary | ICD-10-CM | POA: Diagnosis not present

## 2022-03-31 DIAGNOSIS — I4892 Unspecified atrial flutter: Secondary | ICD-10-CM | POA: Diagnosis not present

## 2022-03-31 NOTE — Telephone Encounter (Signed)
Pt returned call and was notified of echo results. Pt will follow up as planned.  ?

## 2022-03-31 NOTE — Telephone Encounter (Signed)
Pt returning call. Transferred to [place], [place].   ? ?

## 2022-03-31 NOTE — Telephone Encounter (Signed)
Lmom, to discuss echo results. Waiting on a return call.  ?

## 2022-04-01 LAB — CBC
Hematocrit: 35.6 % — ABNORMAL LOW (ref 37.5–51.0)
Hemoglobin: 12.1 g/dL — ABNORMAL LOW (ref 13.0–17.7)
MCH: 32.2 pg (ref 26.6–33.0)
MCHC: 34 g/dL (ref 31.5–35.7)
MCV: 95 fL (ref 79–97)
Platelets: 152 10*3/uL (ref 150–450)
RBC: 3.76 x10E6/uL — ABNORMAL LOW (ref 4.14–5.80)
RDW: 13.4 % (ref 11.6–15.4)
WBC: 3.8 10*3/uL (ref 3.4–10.8)

## 2022-04-01 LAB — TSH: TSH: 1.65 u[IU]/mL (ref 0.450–4.500)

## 2022-04-04 ENCOUNTER — Telehealth: Payer: Self-pay

## 2022-04-04 NOTE — Telephone Encounter (Addendum)
Called patient regarding results. Patient had understanding of results. Patient advise to continue Eliquis without missing any doses.----- Message from Lendon Colonel, NP sent at 04/03/2022 10:33 AM EDT ----- ?I have reviewed his labs. They are essentially normal. No evidence of anemia or thyroid dysfunction leading to his being in atrial flutter. He has an appointment scheduled with Dr.Schumann on follow up to talk about a DCCV. He is to continue his Eliquis without skipping any doses.  ? ?KL ?

## 2022-04-13 ENCOUNTER — Ambulatory Visit: Payer: Medicare Other | Admitting: Cardiology

## 2022-04-13 ENCOUNTER — Encounter: Payer: Self-pay | Admitting: Cardiology

## 2022-04-13 VITALS — BP 97/62 | HR 86 | Ht 68.0 in | Wt 148.4 lb

## 2022-04-13 DIAGNOSIS — I5042 Chronic combined systolic (congestive) and diastolic (congestive) heart failure: Secondary | ICD-10-CM

## 2022-04-13 DIAGNOSIS — I4892 Unspecified atrial flutter: Secondary | ICD-10-CM

## 2022-04-13 DIAGNOSIS — R079 Chest pain, unspecified: Secondary | ICD-10-CM | POA: Diagnosis not present

## 2022-04-13 MED ORDER — METOPROLOL SUCCINATE ER 25 MG PO TB24: 12.5000 mg | ORAL_TABLET | Freq: Every day | ORAL | 3 refills | Status: AC

## 2022-04-13 MED ORDER — METOPROLOL SUCCINATE ER 25 MG PO TB24: 25.0000 mg | ORAL_TABLET | Freq: Every day | ORAL | 3 refills | Status: DC

## 2022-04-13 NOTE — Patient Instructions (Addendum)
Medication Instructions:  Your physician recommends that you continue on your current medications as directed. Please refer to the Current Medication list given to you today.  *If you need a refill on your cardiac medications before your next appointment, please call your pharmacy*   Lab Work: BMET, CBC today  If you have labs (blood work) drawn today and your tests are completely normal, you will receive your results only by: [place] (if you have MyChart) OR A paper copy in the mail If you have any lab test that is abnormal or we need to change your treatment, we will call you to review the results.   Testing/Procedures: Your physician has recommended that you have a Cardioversion (DCCV). Electrical Cardioversion uses a jolt of electricity to your heart either through paddles or wired patches attached to your chest. This is a controlled, usually prescheduled, procedure. Defibrillation is done under light anesthesia in the hospital, and you usually go home the day of the procedure. This is done to get your heart back into a normal rhythm. You are not awake for the procedure. Please see the instruction sheet given to you today.  Follow-Up: At [facility], you and your health needs are our priority.  As part of our continuing mission to provide you with exceptional heart care, we have created designated Provider Care Teams.  These Care Teams include your primary Cardiologist (physician) and Advanced Practice Providers (APPs -  Physician Assistants and Nurse Practitioners) who all work together to provide you with the care you need, when you need it.  We recommend signing up for the patient portal called "MyChart".  Sign up information is provided on this After Visit Summary.  MyChart is used to connect with patients for Virtual Visits (Telemedicine).  Patients are able to view lab/test results, encounter notes, upcoming appointments, etc.  Non-urgent messages can be sent to your  provider as well.   To learn more about what you can do with MyChart, go to [email or web address].    Your next appointment:   1 month(s)  The format for your next appointment:   In Person  Provider:   Donato Heinz, MD {  You have been referred to: Electrophysiology    CARDIOVERSION INSTRUCTIONS:  You are scheduled for a Cardioversion on June 1st with Dr. Harl Bowie.  Please arrive at the [facility] at [facility]: [address] [place], [place] [address] at 7:30 AM.  DIET: Nothing to eat or drink after midnight except a sip of water with medications (see medication instructions below)  FYI: For your safety, and to allow [place] to monitor your vital signs accurately during the surgery/procedure we request that   if you have artificial nails, gel coating, SNS etc. Please have those removed prior to your surgery/procedure. Not having the nail coverings /polish removed may result in cancellation or delay of your surgery/procedure.   Medication Instructions: Continue your anticoagulant: Eliquis  You will need to continue your anticoagulant after your procedure until you are told by your  Provider that it is safe to stop   Labs: today (BMET, CBC)  You must have a responsible person to drive you home and stay in the waiting area during your procedure. Failure to do so could result in cancellation.  Bring your insurance cards.  *Special Note: Every effort is made to have your procedure done on time. Occasionally there are emergencies that occur at the hospital that may cause delays. Please be patient if  a delay does occur.

## 2022-04-13 NOTE — Progress Notes (Signed)
Cardiology Office Note:    Date:  04/13/2022   ID:  Danny Juarez, DOB 03/05/1956, MRN [id number]  PCP:  Patient, No Pcp Per (Inactive)  Cardiologist:  Donato Heinz, MD  Electrophysiologist:  None   Referring MD: No ref. provider found   Chief Complaint  Patient presents with   Atrial Flutter    History of Present Illness:    Danny Juarez is a 66 y.o. male with a hx of multiple myeloma status post bone marrow transplant at [facility] 09/2019, pulmonary embolism who presents for follow-up.  He was referred by Dr. Maylon Peppers for evaluation of chest pain, initially seen on 02/20/2020.  Reports that since January he has been having right-sided chest pain.  Describes as dull aching pain, primarily occurs in the morning when he is walking his dog.  Has only noted the chest pain when exercising, though states that sometimes he is able to do significant exertion without any chest pain.  At initial clinic visit on 02/20/2020, EKG showed atrial flutter with 3:1 conduction, rate 94.  He was started on metoprolol 25 mg daily for rate control in Eliquis 5 mg twice daily.  Discussed anticoagulation with Dr. Maylon Peppers.  TTE on 03/03/2020 showed LVEF 35 to 40%, diffuse hypokinesis worse in inferior wall with abnormal septal motion, normal RV systolic function, mild MR, IVC small/collapsible.  Underwent successful DCCV on 03/29/2020.  He was lost to follow-up until seen by [name] on 03/24/2022, was back in atrial flutter and restarted on Eliquis.  Echocardiogram 03/28/2022 showed EF 45 to 50%, global hypokinesis, normal RV function, moderate left atrial enlargement, mild MR, mild dilatation of aortic root measuring 40 mm.  Since last clinic visit, he reports that he felt like he went back into atrial flutter a couple months ago.  He was started on Eliquis on 5/5, reports has not missed any doses.  Denies any bleeding issues.  He reports has been having chest pain/dyspnea when walking dogs.  Reports  right-sided chest pain.  Also has some lightheadedness with standing.  Denies any syncope.  Denies any lower extremity edema.   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    Past Surgical History:  Procedure Laterality Date   CARDIOVERSION N/A 03/29/2020   Procedure: CARDIOVERSION;  Surgeon: Nahser, Wonda Cheng, MD;  Location: [place];  Service: Cardiovascular;  Laterality: N/A;   HUMERUS IM NAIL Left 04/08/2019   Procedure: INTRAMEDULLARY (IM) NAIL HUMERUS;  Surgeon: Nicholes Stairs, MD;  Location: [place];  Service: Orthopedics;  Laterality: Left;  120 mins   IR IMAGING GUIDED PORT INSERTION  07/25/2019   IR REMOVAL TUN ACCESS W/ PORT W/O FL MOD SED  06/29/2020   KNEE ARTHROSCOPY Left     Current Medications: Current Meds  Medication Sig   ACYCLOVIR PO Take by mouth in the morning and at bedtime. Take 1/2 tablet twice daily   apixaban (ELIQUIS) 5 MG TABS tablet Take 1 tablet (5 mg total) by mouth 2 (two) times daily.   CALCIUM-VITAMIN D PO Take 1 tablet by mouth daily.    ferrous sulfate 325 (65 FE) MG tablet Take 325 mg by mouth daily with breakfast.   folic acid (  FOLVITE) 1 MG tablet Take 1 mg by mouth daily.    Multiple Vitamin (MULTIVITAMIN WITH MINERALS) TABS Take 1 tablet by mouth daily.   pomalidomide (POMALYST) 1 MG capsule Take by mouth.   [DISCONTINUED] metoprolol succinate (TOPROL XL) 25 MG 24 hr tablet Take 0.5 tablets (12.5 mg total) by mouth daily.     Allergies:   Patient has no known allergies.   Social History   Socioeconomic History   Marital status: Divorced    Spouse name: Not on file   Number of children: 1   Years of education: Not on file   Highest education level: Not on file  Occupational History   Not on file  Tobacco Use   Smoking status: Never    Smokeless tobacco: Never  Vaping Use   Vaping Use: Never used  Substance and Sexual Activity   Alcohol use: Yes    Comment: occassional   Drug use: No   Sexual activity: Not on file  Other Topics Concern   Not on file  Social History Narrative   Patient is divorced with one daughter that lives in [place], [place].   Patient has never smoked nor used smokeless tobacco.   Patient with occasional use of alcohol.   Patient denies use of illicit drugs.   Patient is a retired Set designer.   Social Determinants of Health   Financial Resource Strain: Not on file  Food Insecurity: Not on file  Transportation Needs: Not on file  Physical Activity: Not on file  Stress: Not on file  Social Connections: Not on file     Family History: The patient's family history is not on file.  ROS:   Please see the history of present illness.     All other systems reviewed and are negative.  EKGs/Labs/Other Studies Reviewed:    The following studies were reviewed today:   EKG:   04/13/2022: Atrial flutter, rate 86 04/11/2020:normal sinus rhythm, rate 71, T wave inversions in leads III, aVF  TTE 03/03/20:  1. Diffuse hypokinesis worse in the inferior wall with abnormal septal  motion Patient in atrial flutter throughout the study . Left ventricular  ejection fraction, by estimation, is 35 to 40%. The left ventricle has  moderately decreased function. The  left ventricle demonstrates global hypokinesis. The left ventricular  internal cavity size was mildly dilated. Left ventricular diastolic  parameters are indeterminate.   2. Right ventricular systolic function is normal. The right ventricular  size is normal. There is normal pulmonary artery systolic pressure.   3. Left atrial size was mildly dilated.   4. The mitral valve is normal in structure. Mild mitral valve  regurgitation. No evidence of mitral stenosis.   5. The aortic valve is tricuspid. Aortic valve  regurgitation is not  visualized. Mild aortic valve sclerosis is present, with no evidence of  aortic valve stenosis.   6. The inferior vena cava is normal in size with greater than 50%  respiratory variability, suggesting right atrial pressure of 3 mmHg.   Recent Labs: 03/31/2022: Hemoglobin 12.1; Platelets 152; TSH 1.650  Recent Lipid Panel    Component Value Date/Time   CHOL 152 04/16/2020 0846   TRIG 83 04/16/2020 0846   HDL 56 04/16/2020 0846   CHOLHDL 2.7 04/16/2020 0846   LDLCALC 80 04/16/2020 0846    Physical Exam:    VS:  BP 97/62 (BP Location: Left Arm, Patient Position: Sitting, Cuff Size: Normal)   Pulse 86  Ht '5\' 8"'$  (1.727 m)   Wt 148 lb 6.4 oz (67.3 kg)   SpO2 96%   BMI 22.56 kg/m     Wt Readings from Last 3 Encounters:  04/13/22 148 lb 6.4 oz (67.3 kg)  03/24/22 148 lb 3.2 oz (67.2 kg)  10/29/20 140 lb 12.8 oz (63.9 kg)     GEN:   in no acute distress HEENT: Normal NECK: No JVD CARDIAC: RRR, no murmurs, rubs, gallops RESPIRATORY:  Clear to auscultation without rales, wheezing or rhonchi  ABDOMEN: Soft, non-tender, non-distended MUSCULOSKELETAL:  No edema; No deformity  SKIN: Warm and dry NEUROLOGIC:  Alert and oriented x 3 PSYCHIATRIC:  Normal affect   ASSESSMENT:    1. Atrial flutter, unspecified type ([place])   2. Chest pain of uncertain etiology   3. Chronic combined systolic and diastolic heart failure (H[facility])     PLAN:     Atrial flutter: diagnosed at initial clinic visit on 02/20/2020. CHA2DS2-VASc score 2 given systolic heart failure, age.   TTE 03/03/20 showed LVEF 35 to 40%, diffuse hypokinesis worse in inferior wall with abnormal septal motion, normal RV systolic function, mild MR, IVC small/collapsible.  Underwent successful cardioversion on 03/29/2020.  He was lost to follow-up until seen by [name] on 03/24/2022, was back in atrial flutter and restarted on Eliquis.  Echocardiogram 03/28/2022 showed EF 45 to 50%, global hypokinesis, normal  RV function, moderate left atrial enlargement, mild MR, mild dilatation of aortic root measuring 40 mm. -Continue Toprol-XL 12.5 mg daily -Continue Eliquis 5 mg twice daily for anticoagulation.  He has a history of multiple myeloma status post bone marrow transplant in November 2020, reports is in remission and counts have been stable.  Previously had discussed anticoagulation with Dr Maylon Peppers, ok to anticoagulate.   -Check BMET, CBC -Plan cardioversion, scheduled for 6/1 -Recommend referral to EP to consider ablation given second episode of atrial flutter requiring cardioversion  Chest pain: Atypical in description, as describes right-sided chest pain, though does occur with exertion.  Previously recommended coronary CTA for further evaluation but he did not follow-up.  May be related to atrial flutter, will follow-up following cardioversion, if continues to have chest pain once back in sinus rhythm we will plan coronary CTA  Chronic combined systolic and diastolic heart failure: EF 35 to 40% 02/2020.  Appears euvolemic.  May be tachycardia induced due to atrial flutter.  Ischemia also on differential given his chest pain.  Echocardiogram 03/28/2022 showed EF 45 to 50%, -Continue Toprol-XL 12.5 mg daily -BP soft, no room to add losartan -Plan repeat echocardiogram once back in sinus rhythm   RTC in 1 month   Medication Adjustments/Labs and Tests Ordered: Current medicines are reviewed at length with the patient today.  Concerns regarding medicines are outlined above.  Orders Placed This Encounter  Procedures   Basic metabolic panel   CBC   Lipid panel   Ambulatory referral to Cardiac Electrophysiology   Meds ordered this encounter  Medications   metoprolol succinate (TOPROL XL) 25 MG 24 hr tablet    Sig: Take 1 tablet (25 mg total) by mouth daily.    Dispense:  90 tablet    Refill:  3    Dose increase    Patient Instructions  Medication Instructions:  INCREASE metoprolol (Toprol XL) to  25 mg daily  *If you need a refill on your cardiac medications before your next appointment, please call your pharmacy*   Lab Work: BMET, CBC today  If you  have labs (blood work) drawn today and your tests are completely normal, you will receive your results only by: [facility] (if you have MyChart) OR A paper copy in the mail If you have any lab test that is abnormal or we need to change your treatment, we will call you to review the results.   Testing/Procedures: Your physician has recommended that you have a Cardioversion (DCCV). Electrical Cardioversion uses a jolt of electricity to your heart either through paddles or wired patches attached to your chest. This is a controlled, usually prescheduled, procedure. Defibrillation is done under light anesthesia in the hospital, and you usually go home the day of the procedure. This is done to get your heart back into a normal rhythm. You are not awake for the procedure. Please see the instruction sheet given to you today.  Follow-Up: At [facility], you and your health needs are our priority.  As part of our continuing mission to provide you with exceptional heart care, we have created designated Provider Care Teams.  These Care Teams include your primary Cardiologist (physician) and Advanced Practice Providers (APPs -  Physician Assistants and Nurse Practitioners) who all work together to provide you with the care you need, when you need it.  We recommend signing up for the patient portal called "MyChart".  Sign up information is provided on this After Visit Summary.  MyChart is used to connect with patients for Virtual Visits (Telemedicine).  Patients are able to view lab/test results, encounter notes, upcoming appointments, etc.  Non-urgent messages can be sent to your provider as well.   To learn more about what you can do with MyChart, go to [email or web address].    Your next appointment:   1 month(s)  The format for your next  appointment:   In Person  Provider:   Donato Heinz, MD {  You have been referred to: Electrophysiology    CARDIOVERSION INSTRUCTIONS:  You are scheduled for a Cardioversion on June 1st with Dr. Harl Bowie.  Please arrive at the [facility] (Main Entrance A) at [facility]: [address] [place], [place] [address] at 7:30 AM.  DIET: Nothing to eat or drink after midnight except a sip of water with medications (see medication instructions below)  FYI: For your safety, and to allow [place] to monitor your vital signs accurately during the surgery/procedure we request that   if you have artificial nails, gel coating, SNS etc. Please have those removed prior to your surgery/procedure. Not having the nail coverings /polish removed may result in cancellation or delay of your surgery/procedure.   Medication Instructions: Continue your anticoagulant: Eliquis  You will need to continue your anticoagulant after your procedure until you are told by your  Provider that it is safe to stop   Labs: today (BMET, CBC)  You must have a responsible person to drive you home and stay in the waiting area during your procedure. Failure to do so could result in cancellation.  Bring your insurance cards.  *Special Note: Every effort is made to have your procedure done on time. Occasionally there are emergencies that occur at the hospital that may cause delays. Please be patient if a delay does occur.            Signed, Donato Heinz, MD  04/13/2022 2:26 PM    [place]

## 2022-04-13 NOTE — H&P (View-Only) (Signed)
Cardiology Office Note:    Date:  04/13/2022   ID:  Danny Juarez, DOB 03/05/1956, MRN [id number]  PCP:  Patient, No Pcp Per (Inactive)  Cardiologist:  Donato Heinz, MD  Electrophysiologist:  None   Referring MD: No ref. provider found   Chief Complaint  Patient presents with   Atrial Flutter    History of Present Illness:    Danny Juarez is a 66 y.o. male with a hx of multiple myeloma status post bone marrow transplant at [facility] 09/2019, pulmonary embolism who presents for follow-up.  He was referred by Dr. Maylon Juarez for evaluation of chest pain, initially seen on 02/20/2020.  Reports that since January he has been having right-sided chest pain.  Describes as dull aching pain, primarily occurs in the morning when he is walking his dog.  Has only noted the chest pain when exercising, though states that sometimes he is able to do significant exertion without any chest pain.  At initial clinic visit on 02/20/2020, EKG showed atrial flutter with 3:1 conduction, rate 94.  He was started on metoprolol 25 mg daily for rate control in Eliquis 5 mg twice daily.  Discussed anticoagulation with Dr. Maylon Juarez.  TTE on 03/03/2020 showed LVEF 35 to 40%, diffuse hypokinesis worse in inferior wall with abnormal septal motion, normal RV systolic function, mild MR, IVC small/collapsible.  Underwent successful DCCV on 03/29/2020.  He was lost to follow-up until seen by [name] on 03/24/2022, was back in atrial flutter and restarted on Eliquis.  Echocardiogram 03/28/2022 showed EF 45 to 50%, global hypokinesis, normal RV function, moderate left atrial enlargement, mild MR, mild dilatation of aortic root measuring 40 mm.  Since last clinic visit, he reports that he felt like he went back into atrial flutter a couple months ago.  He was started on Eliquis on 5/5, reports has not missed any doses.  Denies any bleeding issues.  He reports has been having chest pain/dyspnea when walking dogs.  Reports  right-sided chest pain.  Also has some lightheadedness with standing.  Denies any syncope.  Denies any lower extremity edema.   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    Past Surgical History:  Procedure Laterality Date   CARDIOVERSION N/A 03/29/2020   Procedure: CARDIOVERSION;  Surgeon: Nahser, Wonda Cheng, MD;  Location: [place];  Service: Cardiovascular;  Laterality: N/A;   HUMERUS IM NAIL Left 04/08/2019   Procedure: INTRAMEDULLARY (IM) NAIL HUMERUS;  Surgeon: Nicholes Stairs, MD;  Location: [facility];  Service: Orthopedics;  Laterality: Left;  120 mins   IR IMAGING GUIDED PORT INSERTION  07/25/2019   IR REMOVAL TUN ACCESS W/ PORT W/O FL MOD SED  06/29/2020   KNEE ARTHROSCOPY Left     Current Medications: Current Meds  Medication Sig   ACYCLOVIR PO Take by mouth in the morning and at bedtime. Take 1/2 tablet twice daily   apixaban (ELIQUIS) 5 MG TABS tablet Take 1 tablet (5 mg total) by mouth 2 (two) times daily.   CALCIUM-VITAMIN D PO Take 1 tablet by mouth daily.    ferrous sulfate 325 (65 FE) MG tablet Take 325 mg by mouth daily with breakfast.   folic acid (  FOLVITE) 1 MG tablet Take 1 mg by mouth daily.    Multiple Vitamin (MULTIVITAMIN WITH MINERALS) TABS Take 1 tablet by mouth daily.   pomalidomide (POMALYST) 1 MG capsule Take by mouth.   [DISCONTINUED] metoprolol succinate (TOPROL XL) 25 MG 24 hr tablet Take 0.5 tablets (12.5 mg total) by mouth daily.     Allergies:   Patient has no known allergies.   Social History   Socioeconomic History   Marital status: Divorced    Spouse name: Not on file   Number of children: 1   Years of education: Not on file   Highest education level: Not on file  Occupational History   Not on file  Tobacco Use   Smoking status: Never    Smokeless tobacco: Never  Vaping Use   Vaping Use: Never used  Substance and Sexual Activity   Alcohol use: Yes    Comment: occassional   Drug use: No   Sexual activity: Not on file  Other Topics Concern   Not on file  Social History Narrative   Patient is divorced with one daughter that lives in [place], [place].   Patient has never smoked nor used smokeless tobacco.   Patient with occasional use of alcohol.   Patient denies use of illicit drugs.   Patient is a retired Set designer.   Social Determinants of Health   Financial Resource Strain: Not on file  Food Insecurity: Not on file  Transportation Needs: Not on file  Physical Activity: Not on file  Stress: Not on file  Social Connections: Not on file     Family History: The patient's family history is not on file.  ROS:   Please see the history of present illness.     All other systems reviewed and are negative.  EKGs/Labs/Other Studies Reviewed:    The following studies were reviewed today:   EKG:   04/13/2022: Atrial flutter, rate 86 04/11/2020:normal sinus rhythm, rate 71, T wave inversions in leads III, aVF  TTE 03/03/20:  1. Diffuse hypokinesis worse in the inferior wall with abnormal septal  motion Patient in atrial flutter throughout the study . Left ventricular  ejection fraction, by estimation, is 35 to 40%. The left ventricle has  moderately decreased function. The  left ventricle demonstrates global hypokinesis. The left ventricular  internal cavity size was mildly dilated. Left ventricular diastolic  parameters are indeterminate.   2. Right ventricular systolic function is normal. The right ventricular  size is normal. There is normal pulmonary artery systolic pressure.   3. Left atrial size was mildly dilated.   4. The mitral valve is normal in structure. Mild mitral valve  regurgitation. No evidence of mitral stenosis.   5. The aortic valve is tricuspid. Aortic valve  regurgitation is not  visualized. Mild aortic valve sclerosis is present, with no evidence of  aortic valve stenosis.   6. The inferior vena cava is normal in size with greater than 50%  respiratory variability, suggesting right atrial pressure of 3 mmHg.   Recent Labs: 03/31/2022: Hemoglobin 12.1; Platelets 152; TSH 1.650  Recent Lipid Panel    Component Value Date/Time   CHOL 152 04/16/2020 0846   TRIG 83 04/16/2020 0846   HDL 56 04/16/2020 0846   CHOLHDL 2.7 04/16/2020 0846   LDLCALC 80 04/16/2020 0846    Physical Exam:    VS:  BP 97/62 (BP Location: Left Arm, Patient Position: Sitting, Cuff Size: Normal)   Pulse 86  Ht '5\' 8"'$  (1.727 m)   Wt 148 lb 6.4 oz (67.3 kg)   SpO2 96%   BMI 22.56 kg/m     Wt Readings from Last 3 Encounters:  04/13/22 148 lb 6.4 oz (67.3 kg)  03/24/22 148 lb 3.2 oz (67.2 kg)  10/29/20 140 lb 12.8 oz (63.9 kg)     GEN:   in no acute distress HEENT: Normal NECK: No JVD CARDIAC: RRR, no murmurs, rubs, gallops RESPIRATORY:  Clear to auscultation without rales, wheezing or rhonchi  ABDOMEN: Soft, non-tender, non-distended MUSCULOSKELETAL:  No edema; No deformity  SKIN: Warm and dry NEUROLOGIC:  Alert and oriented x 3 PSYCHIATRIC:  Normal affect   ASSESSMENT:    1. Atrial flutter, unspecified type ([place])   2. Chest pain of uncertain etiology   3. Chronic combined systolic and diastolic heart failure (HCC)     PLAN:     Atrial flutter: diagnosed at initial clinic visit on 02/20/2020. CHA2DS2-VASc score 2 given systolic heart failure, age.   TTE 03/03/20 showed LVEF 35 to 40%, diffuse hypokinesis worse in inferior wall with abnormal septal motion, normal RV systolic function, mild MR, IVC small/collapsible.  Underwent successful cardioversion on 03/29/2020.  He was lost to follow-up until seen by [name] on 03/24/2022, was back in atrial flutter and restarted on Eliquis.  Echocardiogram 03/28/2022 showed EF 45 to 50%, global hypokinesis, normal  RV function, moderate left atrial enlargement, mild MR, mild dilatation of aortic root measuring 40 mm. -Continue Toprol-XL 12.5 mg daily -Continue Eliquis 5 mg twice daily for anticoagulation.  He has a history of multiple myeloma status post bone marrow transplant in November 2020, reports is in remission and counts have been stable.  Previously had discussed anticoagulation with Dr Danny Juarez, ok to anticoagulate.   -Check BMET, CBC -Plan cardioversion, scheduled for 6/1 -Recommend referral to EP to consider ablation given second episode of atrial flutter requiring cardioversion  Chest pain: Atypical in description, as describes right-sided chest pain, though does occur with exertion.  Previously recommended coronary CTA for further evaluation but he did not follow-up.  May be related to atrial flutter, will follow-up following cardioversion, if continues to have chest pain once back in sinus rhythm we will plan coronary CTA  Chronic combined systolic and diastolic heart failure: EF 35 to 40% 02/2020.  Appears euvolemic.  May be tachycardia induced due to atrial flutter.  Ischemia also on differential given his chest pain.  Echocardiogram 03/28/2022 showed EF 45 to 50%, -Continue Toprol-XL 12.5 mg daily -BP soft, no room to add losartan -Plan repeat echocardiogram once back in sinus rhythm   RTC in 1 month   Medication Adjustments/Labs and Tests Ordered: Current medicines are reviewed at length with the patient today.  Concerns regarding medicines are outlined above.  Orders Placed This Encounter  Procedures   Basic metabolic panel   CBC   Lipid panel   Ambulatory referral to Cardiac Electrophysiology   Meds ordered this encounter  Medications   metoprolol succinate (TOPROL XL) 25 MG 24 hr tablet    Sig: Take 1 tablet (25 mg total) by mouth daily.    Dispense:  90 tablet    Refill:  3    Dose increase    Patient Instructions  Medication Instructions:  INCREASE metoprolol (Toprol XL) to  25 mg daily  *If you need a refill on your cardiac medications before your next appointment, please call your pharmacy*   Lab Work: BMET, CBC today  If you  have labs (blood work) drawn today and your tests are completely normal, you will receive your results only by: [facility] (if you have MyChart) OR A paper copy in the mail If you have any lab test that is abnormal or we need to change your treatment, we will call you to review the results.   Testing/Procedures: Your physician has recommended that you have a Cardioversion (DCCV). Electrical Cardioversion uses a jolt of electricity to your heart either through paddles or wired patches attached to your chest. This is a controlled, usually prescheduled, procedure. Defibrillation is done under light anesthesia in the hospital, and you usually go home the day of the procedure. This is done to get your heart back into a normal rhythm. You are not awake for the procedure. Please see the instruction sheet given to you today.  Follow-Up: At [facility], you and your health needs are our priority.  As part of our continuing mission to provide you with exceptional heart care, we have created designated Provider Care Teams.  These Care Teams include your primary Cardiologist (physician) and Advanced Practice Providers (APPs -  Physician Assistants and Nurse Practitioners) who all work together to provide you with the care you need, when you need it.  We recommend signing up for the patient portal called "MyChart".  Sign up information is provided on this After Visit Summary.  MyChart is used to connect with patients for Virtual Visits (Telemedicine).  Patients are able to view lab/test results, encounter notes, upcoming appointments, etc.  Non-urgent messages can be sent to your provider as well.   To learn more about what you can do with MyChart, go to [email or web address].    Your next appointment:   1 month(s)  The format for your next  appointment:   In Person  Provider:   Donato Heinz, MD {  You have been referred to: Electrophysiology    CARDIOVERSION INSTRUCTIONS:  You are scheduled for a Cardioversion on June 1st with Dr. Harl Bowie.  Please arrive at the [facility] (Main Entrance A) at [facility]: [address] [place], [place] [address] at 7:30 AM.  DIET: Nothing to eat or drink after midnight except a sip of water with medications (see medication instructions below)  FYI: For your safety, and to allow [place] to monitor your vital signs accurately during the surgery/procedure we request that   if you have artificial nails, gel coating, SNS etc. Please have those removed prior to your surgery/procedure. Not having the nail coverings /polish removed may result in cancellation or delay of your surgery/procedure.   Medication Instructions: Continue your anticoagulant: Eliquis  You will need to continue your anticoagulant after your procedure until you are told by your  Provider that it is safe to stop   Labs: today (BMET, CBC)  You must have a responsible person to drive you home and stay in the waiting area during your procedure. Failure to do so could result in cancellation.  Bring your insurance cards.  *Special Note: Every effort is made to have your procedure done on time. Occasionally there are emergencies that occur at the hospital that may cause delays. Please be patient if a delay does occur.            Signed, Donato Heinz, MD  04/13/2022 2:26 PM    [place]

## 2022-04-13 NOTE — Addendum Note (Signed)
Addended by: Patria Mane A on: 04/13/2022 02:31 PM   Modules accepted: Orders

## 2022-04-14 ENCOUNTER — Other Ambulatory Visit: Payer: Self-pay | Admitting: *Deleted

## 2022-04-14 ENCOUNTER — Encounter: Payer: Self-pay | Admitting: Hematology

## 2022-04-14 DIAGNOSIS — D72819 Decreased white blood cell count, unspecified: Secondary | ICD-10-CM

## 2022-04-14 DIAGNOSIS — D696 Thrombocytopenia, unspecified: Secondary | ICD-10-CM

## 2022-04-14 LAB — LIPID PANEL
Chol/HDL Ratio: 2.7 ratio (ref 0.0–5.0)
Cholesterol, Total: 126 mg/dL (ref 100–199)
HDL: 46 mg/dL (ref >39)
LDL Chol Calc (NIH): 63 mg/dL (ref 0–99)
Triglycerides: 86 mg/dL (ref 0–149)
VLDL Cholesterol Cal: 17 mg/dL (ref 5–40)

## 2022-04-14 LAB — BASIC METABOLIC PANEL
BUN/Creatinine Ratio: 35 — ABNORMAL HIGH (ref 10–24)
BUN: 23 mg/dL (ref 8–27)
CO2: 23 mmol/L (ref 20–29)
Calcium: 9 mg/dL (ref 8.6–10.2)
Chloride: 103 mmol/L (ref 96–106)
Creatinine, Ser: 0.66 mg/dL — ABNORMAL LOW (ref 0.76–1.27)
Glucose: 78 mg/dL (ref 70–99)
Potassium: 4.9 mmol/L (ref 3.5–5.2)
Sodium: 140 mmol/L (ref 134–144)
eGFR: 103 mL/min/{1.73_m2} (ref >59)

## 2022-04-14 LAB — CBC
Hematocrit: 33.2 % — ABNORMAL LOW (ref 37.5–51.0)
Hemoglobin: 11.7 g/dL — ABNORMAL LOW (ref 13.0–17.7)
MCH: 32.6 pg (ref 26.6–33.0)
MCHC: 35.2 g/dL (ref 31.5–35.7)
MCV: 93 fL (ref 79–97)
Platelets: 89 10*3/uL — CL (ref 150–450)
RBC: 3.59 x10E6/uL — ABNORMAL LOW (ref 4.14–5.80)
RDW: 13.1 % (ref 11.6–15.4)
WBC: 1.7 10*3/uL — CL (ref 3.4–10.8)

## 2022-04-18 ENCOUNTER — Other Ambulatory Visit: Payer: Self-pay

## 2022-04-18 DIAGNOSIS — D72819 Decreased white blood cell count, unspecified: Secondary | ICD-10-CM

## 2022-04-18 DIAGNOSIS — D696 Thrombocytopenia, unspecified: Secondary | ICD-10-CM | POA: Diagnosis not present

## 2022-04-18 NOTE — Addendum Note (Signed)
Addended by: Orma Render on: 04/18/2022 11:49 AM   Modules accepted: Orders

## 2022-04-19 ENCOUNTER — Encounter: Payer: Self-pay | Admitting: Cardiology

## 2022-04-19 ENCOUNTER — Telehealth: Payer: Self-pay | Admitting: Cardiology

## 2022-04-19 DIAGNOSIS — D696 Thrombocytopenia, unspecified: Secondary | ICD-10-CM

## 2022-04-19 DIAGNOSIS — I5042 Chronic combined systolic (congestive) and diastolic (congestive) heart failure: Secondary | ICD-10-CM

## 2022-04-19 DIAGNOSIS — I4892 Unspecified atrial flutter: Secondary | ICD-10-CM

## 2022-04-19 DIAGNOSIS — D72819 Decreased white blood cell count, unspecified: Secondary | ICD-10-CM

## 2022-04-19 LAB — CBC WITH DIFFERENTIAL
Basophils Absolute: 0 10*3/uL (ref 0.0–0.2)
Basos: 2 %
EOS (ABSOLUTE): 0.1 10*3/uL (ref 0.0–0.4)
Eos: 6 %
Hematocrit: 33.5 % — ABNORMAL LOW (ref 37.5–51.0)
Hemoglobin: 11.5 g/dL — ABNORMAL LOW (ref 13.0–17.7)
Immature Grans (Abs): 0 10*3/uL (ref 0.0–0.1)
Immature Granulocytes: 0 %
Lymphocytes Absolute: 0.8 10*3/uL (ref 0.7–3.1)
Lymphs: 44 %
MCH: 31.7 pg (ref 26.6–33.0)
MCHC: 34.3 g/dL (ref 31.5–35.7)
MCV: 92 fL (ref 79–97)
Monocytes Absolute: 0.5 10*3/uL (ref 0.1–0.9)
Monocytes: 25 %
Neutrophils Absolute: 0.4 10*3/uL — CL (ref 1.4–7.0)
Neutrophils: 23 %
RBC: 3.63 x10E6/uL — ABNORMAL LOW (ref 4.14–5.80)
RDW: 13 % (ref 11.6–15.4)
WBC: 1.8 10*3/uL — CL (ref 3.4–10.8)

## 2022-04-19 NOTE — Progress Notes (Signed)
Spoke with patient's hematologist, Dr Jeannene Patella at the [facility]. he reports he has had no evidence of recurrent multiple myeloma but recently started on Pomalid and that is likely the cause of his cytopenia.  Labs from yesterday likely represent the nadir of his counts, would expect to improve from this point.  Given his severe neutropenia, Dr. Jeannene Patella plans to hold his Pomalid until counts recover.  From his perspective, he is fine to proceed with cardioversion.  Hayley, can we schedule cardioversion in about 2 weeks?  And repeat a CBC with differential a few days before?

## 2022-04-19 NOTE — Telephone Encounter (Signed)
  Pt is calling and requesting to speak with Hayley again

## 2022-04-19 NOTE — Telephone Encounter (Signed)
See result note.  

## 2022-04-19 NOTE — Telephone Encounter (Signed)
Pt returning call. Transferred to [place] Lay, RN

## 2022-04-19 NOTE — Progress Notes (Signed)
Left message to call back to reschedule cardioversion

## 2022-04-19 NOTE — Telephone Encounter (Signed)
Spoke to patient, aware of plan and ok to reschedule.  Will reschedule and call patient back with update.

## 2022-04-20 ENCOUNTER — Encounter (HOSPITAL_COMMUNITY): Admission: RE | Payer: Self-pay | Source: Home / Self Care

## 2022-04-20 ENCOUNTER — Ambulatory Visit (HOSPITAL_COMMUNITY): Admission: RE | Admit: 2022-04-20 | Payer: Medicare Other | Source: Home / Self Care | Admitting: Internal Medicine

## 2022-04-20 SURGERY — CARDIOVERSION
Anesthesia: Monitor Anesthesia Care

## 2022-04-20 NOTE — Telephone Encounter (Signed)
Patient aware of cardioversion date and time.  He will plan to get repeat labs on 6/19.  Orders placed.

## 2022-04-20 NOTE — Telephone Encounter (Signed)
Left message to call back  

## 2022-04-20 NOTE — Telephone Encounter (Signed)
Cardioversion rescheduled   You are scheduled for a Cardioversion on Wednesday, 6/21 with Dr. Gardiner Rhyme.  Please arrive at the [facility] (Main Entrance A) at [facility]: [address] [place], [place] [address] at 10:30 AM.  DIET: Nothing to eat or drink after midnight except a sip of water with medications (see medication instructions below)  FYI: For your safety, and to allow [place] to monitor your vital signs accurately during the surgery/procedure we request that   if you have artificial nails, gel coating, SNS etc. Please have those removed prior to your surgery/procedure. Not having the nail coverings /polish removed may result in cancellation or delay of your surgery/procedure.   Medication Instructions: Continue your anticoagulant: Eliquis  You will need to continue your anticoagulant after your procedure until you are told by your  Provider that it is safe to stop   Labs: 2-3 days prior (BMET, CBC w/diff)   You must have a responsible person to drive you home and stay in the waiting area during your procedure. Failure to do so could result in cancellation.  Bring your insurance cards.  *Special Note: Every effort is made to have your procedure done on time. Occasionally there are emergencies that occur at the hospital that may cause delays. Please be patient if a delay does occur.

## 2022-04-20 NOTE — Addendum Note (Signed)
Addended by: Patria Mane A on: 04/20/2022 10:39 AM   Modules accepted: Orders

## 2022-04-20 NOTE — Telephone Encounter (Signed)
Pt is returning call.  

## 2022-04-25 NOTE — Progress Notes (Signed)
See duplicate phone encounter 

## 2022-04-28 ENCOUNTER — Encounter (HOSPITAL_COMMUNITY): Payer: Self-pay | Admitting: Cardiology

## 2022-04-28 NOTE — Progress Notes (Signed)
Attempted to obtain medical history via telephone, unable to reach at this time. HIPAA compliant voicemail message left requesting return call to pre surgical testing department. 

## 2022-05-05 ENCOUNTER — Other Ambulatory Visit: Payer: Self-pay

## 2022-05-05 DIAGNOSIS — D72819 Decreased white blood cell count, unspecified: Secondary | ICD-10-CM | POA: Diagnosis not present

## 2022-05-05 DIAGNOSIS — D696 Thrombocytopenia, unspecified: Secondary | ICD-10-CM | POA: Diagnosis not present

## 2022-05-05 DIAGNOSIS — I5042 Chronic combined systolic (congestive) and diastolic (congestive) heart failure: Secondary | ICD-10-CM | POA: Diagnosis not present

## 2022-05-05 DIAGNOSIS — I4892 Unspecified atrial flutter: Secondary | ICD-10-CM

## 2022-05-06 ENCOUNTER — Encounter: Payer: Self-pay | Admitting: Hematology

## 2022-05-06 LAB — CBC WITH DIFFERENTIAL/PLATELET
Basophils Absolute: 0.1 10*3/uL (ref 0.0–0.2)
Basos: 3 %
EOS (ABSOLUTE): 0.1 10*3/uL (ref 0.0–0.4)
Eos: 3 %
Hematocrit: 37.4 % — ABNORMAL LOW (ref 37.5–51.0)
Hemoglobin: 12.6 g/dL — ABNORMAL LOW (ref 13.0–17.7)
Immature Grans (Abs): 0 10*3/uL (ref 0.0–0.1)
Immature Granulocytes: 0 %
Lymphocytes Absolute: 1.3 10*3/uL (ref 0.7–3.1)
Lymphs: 37 %
MCH: 32.3 pg (ref 26.6–33.0)
MCHC: 33.7 g/dL (ref 31.5–35.7)
MCV: 96 fL (ref 79–97)
Monocytes Absolute: 0.4 10*3/uL (ref 0.1–0.9)
Monocytes: 11 %
Neutrophils Absolute: 1.7 10*3/uL (ref 1.4–7.0)
Neutrophils: 46 %
Platelets: 174 10*3/uL (ref 150–450)
RBC: 3.9 x10E6/uL — ABNORMAL LOW (ref 4.14–5.80)
RDW: 14.4 % (ref 11.6–15.4)
WBC: 3.7 10*3/uL (ref 3.4–10.8)

## 2022-05-06 LAB — BASIC METABOLIC PANEL
BUN/Creatinine Ratio: 30 — ABNORMAL HIGH (ref 10–24)
BUN: 25 mg/dL (ref 8–27)
CO2: 24 mmol/L (ref 20–29)
Calcium: 8.8 mg/dL (ref 8.6–10.2)
Chloride: 103 mmol/L (ref 96–106)
Creatinine, Ser: 0.83 mg/dL (ref 0.76–1.27)
Glucose: 90 mg/dL (ref 70–99)
Potassium: 4.6 mmol/L (ref 3.5–5.2)
Sodium: 138 mmol/L (ref 134–144)
eGFR: 97 mL/min/{1.73_m2} (ref >59)

## 2022-05-09 ENCOUNTER — Other Ambulatory Visit: Payer: Self-pay | Admitting: *Deleted

## 2022-05-09 DIAGNOSIS — I4892 Unspecified atrial flutter: Secondary | ICD-10-CM

## 2022-05-10 ENCOUNTER — Encounter (HOSPITAL_COMMUNITY): Payer: Self-pay | Admitting: Cardiology

## 2022-05-10 ENCOUNTER — Ambulatory Visit (HOSPITAL_BASED_OUTPATIENT_CLINIC_OR_DEPARTMENT_OTHER): Payer: Medicare Other | Admitting: Anesthesiology

## 2022-05-10 ENCOUNTER — Ambulatory Visit (HOSPITAL_COMMUNITY): Payer: Medicare Other | Admitting: Anesthesiology

## 2022-05-10 ENCOUNTER — Ambulatory Visit (HOSPITAL_COMMUNITY)
Admission: RE | Admit: 2022-05-10 | Discharge: 2022-05-10 | Disposition: A | Discharge status: Home / Self Care | Payer: Medicare Other | Attending: Cardiology | Admitting: Cardiology

## 2022-05-10 ENCOUNTER — Encounter (HOSPITAL_COMMUNITY)
Admission: RE | Disposition: A | Discharge status: Home / Self Care | Payer: Self-pay | Source: Home / Self Care | Attending: Cardiology

## 2022-05-10 ENCOUNTER — Other Ambulatory Visit: Payer: Self-pay

## 2022-05-10 DIAGNOSIS — Z86711 Personal history of pulmonary embolism: Secondary | ICD-10-CM | POA: Diagnosis not present

## 2022-05-10 DIAGNOSIS — I4891 Unspecified atrial fibrillation: Secondary | ICD-10-CM | POA: Diagnosis not present

## 2022-05-10 DIAGNOSIS — I5042 Chronic combined systolic (congestive) and diastolic (congestive) heart failure: Secondary | ICD-10-CM | POA: Insufficient documentation

## 2022-05-10 DIAGNOSIS — I2699 Other pulmonary embolism without acute cor pulmonale: Secondary | ICD-10-CM | POA: Diagnosis not present

## 2022-05-10 DIAGNOSIS — I4892 Unspecified atrial flutter: Secondary | ICD-10-CM | POA: Insufficient documentation

## 2022-05-10 DIAGNOSIS — Z79899 Other long term (current) drug therapy: Secondary | ICD-10-CM | POA: Diagnosis not present

## 2022-05-10 DIAGNOSIS — R0789 Other chest pain: Secondary | ICD-10-CM | POA: Diagnosis not present

## 2022-05-10 DIAGNOSIS — C9000 Multiple myeloma not having achieved remission: Secondary | ICD-10-CM | POA: Diagnosis not present

## 2022-05-10 DIAGNOSIS — Z7901 Long term (current) use of anticoagulants: Secondary | ICD-10-CM | POA: Insufficient documentation

## 2022-05-10 DIAGNOSIS — Z9481 Bone marrow transplant status: Secondary | ICD-10-CM | POA: Insufficient documentation

## 2022-05-10 HISTORY — PX: CARDIOVERSION: SHX1299

## 2022-05-10 SURGERY — CARDIOVERSION
Anesthesia: General

## 2022-05-10 MED ORDER — PROPOFOL 10 MG/ML IV BOLUS
INTRAVENOUS | Status: DC | PRN
  Administered 2022-05-10: 70 mg via INTRAVENOUS

## 2022-05-10 MED ORDER — APIXABAN 5 MG PO TABS
5.0000 mg | ORAL_TABLET | Freq: Once | ORAL | Status: AC
Start: 2022-05-10 — End: 2022-05-10
  Administered 2022-05-10: 5 mg via ORAL
  Filled 2022-05-10: qty 1

## 2022-05-10 MED ORDER — SODIUM CHLORIDE 0.9 % IV SOLN: INTRAVENOUS | Status: DC

## 2022-05-10 MED ORDER — LIDOCAINE 2% (20 MG/ML) 5 ML SYRINGE
INTRAMUSCULAR | Status: DC | PRN
  Administered 2022-05-10: 60 mg via INTRAVENOUS

## 2022-05-10 MED ORDER — PHENYLEPHRINE 80 MCG/ML (10ML) SYRINGE FOR IV PUSH (FOR BLOOD PRESSURE SUPPORT)
PREFILLED_SYRINGE | INTRAVENOUS | Status: DC | PRN
  Administered 2022-05-10: 160 ug via INTRAVENOUS
  Administered 2022-05-10 (×2): 80 ug via INTRAVENOUS

## 2022-05-10 NOTE — Transfer of Care (Signed)
Immediate Anesthesia Transfer of Care Note  Patient: Danny Juarez  Procedure(s) Performed: CARDIOVERSION  Patient Location: Endoscopy Unit  Anesthesia Type:General  Level of Consciousness: drowsy and patient cooperative  Airway & Oxygen Therapy: Patient Spontanous Breathing and Patient connected to face mask oxygen  Post-op Assessment: Report given to RN and Post -op Vital signs reviewed and stable  Post vital signs: Reviewed and stable  Last Vitals:  Vitals Value Taken Time  BP 107/74   Temp    Pulse 75   Resp 12   SpO2 100%     Last Pain:  Vitals:   05/10/22 1030  TempSrc: Temporal  PainSc: 0-No pain         Complications: No notable events documented.

## 2022-05-10 NOTE — Interval H&P Note (Signed)
History and Physical Interval Note:  05/10/2022 10:36 AM  Danny Juarez  has presented today for surgery, with the diagnosis of Atrial Flutter.  The various methods of treatment have been discussed with the patient and family. After consideration of risks, benefits and other options for treatment, the patient has consented to  Procedure(s): CARDIOVERSION (N/A) as a surgical intervention.  The patient's history has been reviewed, patient examined, no change in status, stable for surgery.  I have reviewed the patient's chart and labs.  Questions were answered to the patient's satisfaction.     Donato Heinz

## 2022-05-10 NOTE — CV Procedure (Addendum)
Procedure:   DCCV  Indication:  Symptomatic atrial flutter  Procedure Note:  The patient signed informed consent.  They have had had therapeutic anticoagulation with Eliquis greater than 3 weeks.  Anesthesia was administered by Dr. Gloris Manchester and Juanda Chance, CRNA.  Adequate airway was maintained throughout and vital followed per protocol.  They were cardioverted x 1 with 200J of biphasic synchronized energy.  They converted to NSR with rate 60s-70s.  Hypotensive to 78/56 following sedation, improved with phenylephrine and was back to baseline 90s/60s on discharge. The patient had normal neuro status and respiratory status post procedure.    Follow up:  They will continue on current medical therapy and follow up with cardiology as scheduled.  He reports has been taking aspirin, can hold off on aspirin while on Eliquis.   Oswaldo Milian, MD 05/10/2022 11:27 AM

## 2022-05-10 NOTE — Discharge Instructions (Signed)

## 2022-05-10 NOTE — Anesthesia Postprocedure Evaluation (Signed)
Anesthesia Post Note  Patient: Danny Juarez  Procedure(s) Performed: CARDIOVERSION     Patient location during evaluation: Endoscopy Anesthesia Type: General Level of consciousness: awake and alert Pain management: pain level controlled Vital Signs Assessment: post-procedure vital signs reviewed and stable Respiratory status: spontaneous breathing, nonlabored ventilation, respiratory function stable and patient connected to nasal cannula oxygen Cardiovascular status: blood pressure returned to baseline and stable Postop Assessment: no apparent nausea or vomiting Anesthetic complications: no   No notable events documented.  Last Vitals:  Vitals:   05/10/22 1134 05/10/22 1142  BP: (!) 89/65 97/67  Pulse: 70 64  Resp: 13 15  Temp:    SpO2: 100% 100%    Last Pain:  Vitals:   05/10/22 1030  TempSrc: Temporal  PainSc: 0-No pain                 [name]

## 2022-05-10 NOTE — Anesthesia Preprocedure Evaluation (Addendum)
Anesthesia Evaluation  Patient identified by MRN, date of birth, ID band Patient awake    Reviewed: Allergy & Precautions, NPO status , Patient's Chart, lab work & pertinent test results  Airway Mallampati: II  TM Distance: >3 FB Neck ROM: Full    Dental no notable dental hx.    Pulmonary PE   Pulmonary exam normal        Cardiovascular + dysrhythmias Atrial Fibrillation  Rhythm:Irregular Rate:Normal     Neuro/Psych negative neurological ROS  negative psych ROS   GI/Hepatic negative GI ROS,   Endo/Other    Renal/GU   negative genitourinary   Musculoskeletal   Abdominal Normal abdominal exam  (+)   Peds  Hematology   Anesthesia Other Findings   Reproductive/Obstetrics                            Anesthesia Physical Anesthesia Plan  ASA: 3  Anesthesia Plan: General   Post-op Pain Management:    Induction: Intravenous  PONV Risk Score and Plan: 2 and Treatment may vary due to age or medical condition  Airway Management Planned: Simple Face Mask, Natural Airway and Nasal Cannula  Additional Equipment: None  Intra-op Plan:   Post-operative Plan:   Informed Consent: I have reviewed the patients History and Physical, chart, labs and discussed the procedure including the risks, benefits and alternatives for the proposed anesthesia with the patient or authorized representative who has indicated his/her understanding and acceptance.     Dental advisory given  Plan Discussed with:   Anesthesia Plan Comments: (Lab Results      Component                Value               Date                      WBC                      3.7                 05/05/2022                HGB                      12.6 (L)            05/05/2022                HCT                      37.4 (L)            05/05/2022                MCV                      96                  05/05/2022                PLT                       174                 05/05/2022           Lab  Results      Component                Value               Date                      NA                       138                 05/05/2022                K                        4.6                 05/05/2022                CO2                      24                  05/05/2022                GLUCOSE                  90                  05/05/2022                BUN                      25                  05/05/2022                CREATININE               0.83                05/05/2022                CALCIUM                  8.8                 05/05/2022                EGFR                     97                  05/05/2022                GFRNONAA                 >60                 10/29/2020            ECHO 05/23: 1. Pt in atrial flutter at time of study; global hypokinesis worse at the apex; overall mild LV dysfunction. 2. Left ventricular ejection fraction, by estimation, is 45 to 50%. The left ventricle has mildly decreased function. The left ventricle demonstrates global hypokinesis. Left ventricular diastolic parameters are indeterminate. 3. Right ventricular systolic function is normal. The right ventricular size is normal. Tricuspid  regurgitation signal is inadequate for assessing PA pressure. 4. Left atrial size was moderately dilated. 5. The mitral valve is normal in structure. Mild mitral valve regurgitation. No evidence of mitral stenosis. 6. The aortic valve is tricuspid. Aortic valve regurgitation is not visualized. Aortic valve sclerosis is present, with no evidence of aortic valve stenosis. 7. Aortic dilatation noted. There is mild dilatation of the aortic root, measuring 40 mm. 8. The inferior vena cava is normal in size with greater than 50% respiratory variability, suggesting right atrial pressure of 3 mmHg. )        Anesthesia Quick Evaluation

## 2022-05-17 NOTE — Progress Notes (Signed)
Cardiology Clinic Note   Patient Name: Danny Juarez Date of Encounter: 05/19/2022  Primary Care Provider:  Patient, No Pcp Per Primary Cardiologist:  Danny Heinz, MD  Patient Profile     66 y.o. male with a hx of multiple myeloma status post bone marrow transplant at [facility] 09/2019, pulmonary embolism, on 02/20/2020, EKG showed atrial flutter with 3:1 conduction, rate 94.  He was started on metoprolol 25 mg daily for rate control in Eliquis 5 mg twice daily, was back in atrial flutter and restarted on Eliquis.  Echocardiogram 03/28/2022 showed EF 45 to 50%, global hypokinesis, normal RV function, moderate left atrial enlargement, mild MR, mild dilatation of aortic root measuring 40 mm. S/P successful  DCCV with Dr. Gardiner Juarez on 05/10/2022.   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   Past Surgical History:  Procedure Laterality Date   CARDIOVERSION N/A 03/29/2020   Procedure: CARDIOVERSION;  Surgeon: Nahser, Wonda Cheng, MD;  Location: [facility] ENDOSCOPY;  Service: Cardiovascular;  Laterality: N/A;   CARDIOVERSION N/A 05/10/2022   Procedure: CARDIOVERSION;  Surgeon: Danny Heinz, MD;  Location: [place];  Service: Cardiovascular;  Laterality: N/A;   HUMERUS IM NAIL Left 04/08/2019   Procedure: INTRAMEDULLARY (IM) NAIL HUMERUS;  Surgeon: Danny Stairs, MD;  Location: [place];  Service: Orthopedics;  Laterality: Left;  120 mins   IR IMAGING GUIDED PORT INSERTION  07/25/2019   IR REMOVAL TUN ACCESS W/ PORT W/O FL MOD SED  06/29/2020   KNEE ARTHROSCOPY Left     Allergies  No Known Allergies  History of Present Illness    Danny Juarez presents today status post cardioversion in the setting of paroxysmal atrial fibrillation.  I saw him last in  03/24/2022 and was found to be in atrial flutter and I restarted him on Eliquis.  He was then seen by Dr. Alda Juarez on follow-up with planned cardioversion.    This was successful on 05/10/2022 and he is here for evaluation of his response to medication and maintenance of sinus rhythm.  He was to remain on apixaban 5 mg twice daily and metoprolol 12.5 mg daily.  He is without any complaint of bleeding, bruising, rapid heart rhythm, dizziness, or fatigue.  He is tolerating the medications well and is compliant  Home Medications    Current Outpatient Medications  Medication Sig Dispense Refill   acyclovir (ZOVIRAX) 800 MG tablet Take 400 mg by mouth 2 (two) times daily.     apixaban (ELIQUIS) 5 MG TABS tablet Take 1 tablet (5 mg total) by mouth 2 (two) times daily. 60 tablet 2   Calcium Carb-Cholecalciferol (CALCIUM 600 + D PO) Take 1 tablet by mouth daily.     ferrous sulfate 325 (65 FE) MG tablet Take 325 mg by mouth daily with breakfast.     folic acid (FOLVITE) 563 MCG tablet Take 400 mcg by mouth daily.     metoprolol succinate (TOPROL XL) 25 MG 24 hr tablet Take 0.5 tablets (12.5 mg total) by mouth daily. 45 tablet 3   Multiple Vitamin (MULTIVITAMIN WITH MINERALS) TABS Take 1 tablet by mouth daily.     pomalidomide (POMALYST) 2 MG  capsule Take 2 mg by mouth See admin instructions. Take 2 mg daily for 21 days then stop for 7 days then restart (Patient not taking: Reported on 05/19/2022)     No current facility-administered medications for this visit.   Facility-Administered Medications Ordered in Other Visits  Medication Dose Route Frequency Provider Last Rate Last Admin   sodium chloride flush (NS) 0.9 % injection 10 mL  10 mL Intravenous PRN Danny Men, MD   10 mL at 02/12/20 4536     Family History    No family history on file. He indicated that his mother is deceased. He indicated that his father is deceased.  Social History    Social History   Socioeconomic History   Marital  status: Divorced    Spouse name: Not on file   Number of children: 1   Years of education: Not on file   Highest education level: Not on file  Occupational History   Not on file  Tobacco Use   Smoking status: Never   Smokeless tobacco: Never  Vaping Use   Vaping Use: Never used  Substance and Sexual Activity   Alcohol use: Yes    Comment: occassional   Drug use: No   Sexual activity: Not on file  Other Topics Concern   Not on file  Social History Narrative   Patient is divorced with one daughter that lives in [place], [place].   Patient has never smoked nor used smokeless tobacco.   Patient with occasional use of alcohol.   Patient denies use of illicit drugs.   Patient is a retired Set designer.   Social Determinants of Health   Financial Resource Strain: Not on file  Food Insecurity: Not on file  Transportation Needs: Not on file  Physical Activity: Not on file  Stress: Not on file  Social Connections: Not on file  Intimate Partner Violence: Not on file     Review of Systems    General:  No chills, fever, night sweats or weight changes.  Cardiovascular:  No chest pain, dyspnea on exertion, edema, orthopnea, palpitations, paroxysmal nocturnal dyspnea. Dermatological: No rash, lesions/masses Respiratory: No cough, dyspnea Urologic: No hematuria, dysuria Abdominal:   No nausea, vomiting, diarrhea, bright red blood per rectum, melena, or hematemesis Neurologic:  No visual changes, wkns, changes in mental status. All other systems reviewed and are otherwise negative except as noted above.     Physical Exam    VS:  BP 104/72 (BP Location: Left Arm)   Pulse 65   Ht _0  (1.727 m)   Wt 142 lb 6.4 oz (64.6 kg)   SpO2 97%   BMI 21.65 kg/m  , BMI Body mass index is 21.65 kg/m.     GEN: Well nourished, well developed, in no acute distress. HEENT: normal. Neck: Supple, no JVD, carotid bruits, or masses. Cardiac: RRR, bradycardic, 2/6  systolic murmur no murmurs, rubs, or gallops. No clubbing, cyanosis, edema.  Radials/DP/PT 2+ and equal bilaterally.  Respiratory:  Respirations regular and unlabored, clear to auscultation bilaterally. GI: Soft, nontender, nondistended, BS + x 4. MS: no deformity or atrophy. Skin: warm and dry, no rash. Neuro:  Strength and sensation are intact. Psych: Normal affect.  Accessory Clinical Findings    ECG personally reviewed by me today-sinus bradycardia heart rate of 55 bpm, inferior anterior Q waves are noted.- No acute changes  Lab Results  Component Value Date   WBC 3.7 05/05/2022   HGB 12.6 (L) 05/05/2022  HCT 37.4 (L) 05/05/2022   MCV 96 05/05/2022   PLT 174 05/05/2022   Lab Results  Component Value Date   CREATININE 0.83 05/05/2022   BUN 25 05/05/2022   NA 138 05/05/2022   K 4.6 05/05/2022   CL 103 05/05/2022   CO2 24 05/05/2022   Lab Results  Component Value Date   ALT 27 10/29/2020   AST 29 10/29/2020   ALKPHOS 52 10/29/2020   BILITOT 0.6 10/29/2020   Lab Results  Component Value Date   CHOL 126 04/13/2022   HDL 46 04/13/2022   LDLCALC 63 04/13/2022   TRIG 86 04/13/2022   CHOLHDL 2.7 04/13/2022    No results found for: "HGBA1C"  Review of Prior Studies:   Assessment & Plan   1.  Paroxysmal atrial fibrillation: Now status post cardioversion and in sinus bradycardia with a heart rate of 55 bpm.  Rate is well controlled on current medication regimen which includes metoprolol 12.5 mg daily.  He continues to be on apixaban 5 mg twice daily without evidence of bleeding bruising or melena.  He will follow-up with Dr. Nechama Guard in 3 months unless symptomatic.  Will need follow-up be met and CBC  2.  Hypertension: Blood pressure soft 104 72 today.  No evidence of hypertension or significant hypotension or symptoms of orthostatic hypotension.  No changes in his medication regimen.  3.  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  Current medicines are reviewed at length with the patient today.  I have spent 20 min's  dedicated to the care of this patient on the date of this encounter to include pre-visit review of records, assessment, management and diagnostic testing,with shared decision making. Signed, Phill Myron. West Pugh, ANP, AACC   05/19/2022 5:25 PM    [facility] [place] [address] Office [phone] Fax [phone]  Notice: This dictation was prepared with Dragon dictation along with smaller phrase technology. Any transcriptional errors that result from this process are unintentional and may not be corrected upon review.

## 2022-05-19 ENCOUNTER — Encounter: Payer: Self-pay | Admitting: Adult Health

## 2022-05-19 ENCOUNTER — Ambulatory Visit: Payer: Medicare Other | Admitting: Adult Health

## 2022-05-19 VITALS — BP 104/72 | HR 65 | Ht 68.0 in | Wt 142.4 lb

## 2022-05-19 DIAGNOSIS — I483 Typical atrial flutter: Secondary | ICD-10-CM

## 2022-05-19 DIAGNOSIS — T451X5A Adverse effect of antineoplastic and immunosuppressive drugs, initial encounter: Secondary | ICD-10-CM

## 2022-05-19 DIAGNOSIS — D6481 Anemia due to antineoplastic chemotherapy: Secondary | ICD-10-CM

## 2022-05-19 NOTE — Patient Instructions (Signed)
Medication Instructions:  No Changes *If you need a refill on your cardiac medications before your next appointment, please call your pharmacy*   Lab Work: No Labs If you have labs (blood work) drawn today and your tests are completely normal, you will receive your results only by: [place] (if you have MyChart) OR A paper copy in the mail If you have any lab test that is abnormal or we need to change your treatment, we will call you to review the results.   Testing/Procedures: No Testing   Follow-Up: At [facility], you and your health needs are our priority.  As part of our continuing mission to provide you with exceptional heart care, we have created designated Provider Care Teams.  These Care Teams include your primary Cardiologist (physician) and Advanced Practice Providers (APPs -  Physician Assistants and Nurse Practitioners) who all work together to provide you with the care you need, when you need it.  We recommend signing up for the patient portal called "MyChart".  Sign up information is provided on this After Visit Summary.  MyChart is used to connect with patients for Virtual Visits (Telemedicine).  Patients are able to view lab/test results, encounter notes, upcoming appointments, etc.  Non-urgent messages can be sent to your provider as well.   To learn more about what you can do with MyChart, go to [email or web address].    Your next appointment:   3-4 month(s)  The format for your next appointment:   In Person  Provider:   Donato Heinz, MD      Important Information About Sugar

## 2022-05-24 ENCOUNTER — Ambulatory Visit: Payer: Medicare Other | Admitting: Internal Medicine

## 2022-05-24 ENCOUNTER — Telehealth: Payer: Self-pay

## 2022-05-24 ENCOUNTER — Encounter: Payer: Self-pay | Admitting: Internal Medicine

## 2022-05-24 VITALS — BP 100/62 | HR 75 | Ht 68.0 in | Wt 143.2 lb

## 2022-05-24 DIAGNOSIS — Z01812 Encounter for preprocedural laboratory examination: Secondary | ICD-10-CM | POA: Diagnosis not present

## 2022-05-24 DIAGNOSIS — I4892 Unspecified atrial flutter: Secondary | ICD-10-CM | POA: Diagnosis not present

## 2022-05-24 NOTE — Patient Instructions (Signed)
Medication Instructions:  Your physician recommends that you continue on your current medications as directed. Please refer to the Current Medication list given to you today.  *If you need a refill on your cardiac medications before your next appointment, please call your pharmacy*   Lab Work: CBC and BMET If you have labs (blood work) drawn today and your tests are completely normal, you will receive your results only by: [place] (if you have MyChart) OR A paper copy in the mail If you have any lab test that is abnormal or we need to change your treatment, we will call you to review the results.   Testing/Procedures: Your physician has recommended that you have an ablation. Catheter ablation is a medical procedure used to treat some cardiac arrhythmias (irregular heartbeats). During catheter ablation, a long, thin, flexible tube is put into a blood vessel in your groin (upper thigh), or neck. This tube is called an ablation catheter. It is then guided to your heart through the blood vessel. Radio frequency waves destroy small areas of heart tissue where abnormal heartbeats may cause an arrhythmia to start. Please see the instruction sheet given to you today.    Follow-Up: At [facility], you and your health needs are our priority.  As part of our continuing mission to provide you with exceptional heart care, we have created designated Provider Care Teams.  These Care Teams include your primary Cardiologist (physician) and Advanced Practice Providers (APPs -  Physician Assistants and Nurse Practitioners) who all work together to provide you with the care you need, when you need it.  We recommend signing up for the patient portal called "MyChart".  Sign up information is provided on this After Visit Summary.  MyChart is used to connect with patients for Virtual Visits (Telemedicine).  Patients are able to view lab/test results, encounter notes, upcoming appointments, etc.  Non-urgent  messages can be sent to your provider as well.   To learn more about what you can do with MyChart, go to [email or web address].    Your next appointment:   To be determined - Dr Tanna Furry nurse, Sonia Baller will contact you  Important Information About Sugar

## 2022-05-24 NOTE — Telephone Encounter (Signed)
I left the patient a detailed message that Dr. Caryl Comes said that he spoke with Dr. Marin Olp and that Dr. Marin Olp said that it's fine for the patient to stop his iron medication.
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
## 2022-05-24 NOTE — Telephone Encounter (Signed)
Referred to Dr. Lovena Le for atrial flutter ablation.

## 2022-05-25 ENCOUNTER — Encounter: Payer: Self-pay | Admitting: Hematology

## 2022-05-25 LAB — CBC
Hematocrit: 38.4 % (ref 37.5–51.0)
Hemoglobin: 13.3 g/dL (ref 13.0–17.7)
MCH: 32.9 pg (ref 26.6–33.0)
MCHC: 34.6 g/dL (ref 31.5–35.7)
MCV: 95 fL (ref 79–97)
Platelets: 134 10*3/uL — ABNORMAL LOW (ref 150–450)
RBC: 4.04 x10E6/uL — ABNORMAL LOW (ref 4.14–5.80)
RDW: 13.9 % (ref 11.6–15.4)
WBC: 4 10*3/uL (ref 3.4–10.8)

## 2022-05-25 LAB — BASIC METABOLIC PANEL
BUN/Creatinine Ratio: 34 — ABNORMAL HIGH (ref 10–24)
BUN: 28 mg/dL — ABNORMAL HIGH (ref 8–27)
CO2: 24 mmol/L (ref 20–29)
Calcium: 9.3 mg/dL (ref 8.6–10.2)
Chloride: 104 mmol/L (ref 96–106)
Creatinine, Ser: 0.82 mg/dL (ref 0.76–1.27)
Glucose: 78 mg/dL (ref 70–99)
Potassium: 4.1 mmol/L (ref 3.5–5.2)
Sodium: 141 mmol/L (ref 134–144)
eGFR: 97 mL/min/{1.73_m2} (ref >59)

## 2022-05-26 NOTE — Telephone Encounter (Signed)
Sent mychart message

## 2022-05-30 NOTE — Telephone Encounter (Signed)
Pt not interested in ablation at this time.  Advised to reach out to office after his hip replacement if he would like to reconsider.

## 2022-06-05 ENCOUNTER — Telehealth: Payer: Self-pay | Admitting: *Deleted

## 2022-06-05 NOTE — Telephone Encounter (Signed)
Attempt to call patient, no answer, unable to leave VM.

## 2022-06-05 NOTE — Telephone Encounter (Signed)
-----   Message from Donato Heinz, MD sent at 06/01/2022  6:27 AM EDT ----- Regarding: Med Can we start him on Jardiance 10 mg daily?  And check BMET in 1 week? Thanks, Gerald Stabs

## 2022-06-12 ENCOUNTER — Other Ambulatory Visit: Payer: Self-pay

## 2022-06-13 DIAGNOSIS — D759 Disease of blood and blood-forming organs, unspecified: Secondary | ICD-10-CM | POA: Diagnosis not present

## 2022-06-13 DIAGNOSIS — M8700 Idiopathic aseptic necrosis of unspecified bone: Secondary | ICD-10-CM | POA: Diagnosis not present

## 2022-06-13 DIAGNOSIS — I999 Unspecified disorder of circulatory system: Secondary | ICD-10-CM | POA: Diagnosis not present

## 2022-06-13 DIAGNOSIS — M87051 Idiopathic aseptic necrosis of right femur: Secondary | ICD-10-CM | POA: Diagnosis not present

## 2022-06-13 DIAGNOSIS — C9000 Multiple myeloma not having achieved remission: Secondary | ICD-10-CM | POA: Diagnosis not present

## 2022-06-13 DIAGNOSIS — R7989 Other specified abnormal findings of blood chemistry: Secondary | ICD-10-CM | POA: Diagnosis not present

## 2022-06-13 DIAGNOSIS — M461 Sacroiliitis, not elsewhere classified: Secondary | ICD-10-CM | POA: Diagnosis not present

## 2022-06-13 DIAGNOSIS — M949 Disorder of cartilage, unspecified: Secondary | ICD-10-CM | POA: Diagnosis not present

## 2022-06-13 DIAGNOSIS — M160 Bilateral primary osteoarthritis of hip: Secondary | ICD-10-CM | POA: Diagnosis not present

## 2022-06-13 DIAGNOSIS — M8588 Other specified disorders of bone density and structure, other site: Secondary | ICD-10-CM | POA: Diagnosis not present

## 2022-06-14 NOTE — Telephone Encounter (Signed)
Left message to call back  

## 2022-06-15 NOTE — Telephone Encounter (Signed)
Spoke to patient, patient aware of recommendations.  He states he is having hip replacement surgery soon and would like to delay starting new medications at this time.   Discussed medication and purpose of medication-patient is aware but still request to hold off until after surgery.    Patient has follow up scheduled 9/13.

## 2022-06-20 ENCOUNTER — Other Ambulatory Visit: Payer: Self-pay

## 2022-07-13 DIAGNOSIS — M8700 Idiopathic aseptic necrosis of unspecified bone: Secondary | ICD-10-CM | POA: Diagnosis not present

## 2022-07-13 DIAGNOSIS — Z96641 Presence of right artificial hip joint: Secondary | ICD-10-CM | POA: Diagnosis not present

## 2022-07-13 DIAGNOSIS — G8918 Other acute postprocedural pain: Secondary | ICD-10-CM | POA: Diagnosis not present

## 2022-07-13 DIAGNOSIS — Z471 Aftercare following joint replacement surgery: Secondary | ICD-10-CM | POA: Diagnosis not present

## 2022-07-13 DIAGNOSIS — M87051 Idiopathic aseptic necrosis of right femur: Secondary | ICD-10-CM | POA: Diagnosis not present

## 2022-07-13 DIAGNOSIS — Z7901 Long term (current) use of anticoagulants: Secondary | ICD-10-CM | POA: Diagnosis not present

## 2022-07-13 DIAGNOSIS — C9000 Multiple myeloma not having achieved remission: Secondary | ICD-10-CM | POA: Diagnosis not present

## 2022-07-13 DIAGNOSIS — I499 Cardiac arrhythmia, unspecified: Secondary | ICD-10-CM | POA: Diagnosis not present

## 2022-07-13 DIAGNOSIS — D61818 Other pancytopenia: Secondary | ICD-10-CM | POA: Diagnosis not present

## 2022-07-13 DIAGNOSIS — I484 Atypical atrial flutter: Secondary | ICD-10-CM | POA: Diagnosis not present

## 2022-07-13 DIAGNOSIS — I110 Hypertensive heart disease with heart failure: Secondary | ICD-10-CM | POA: Diagnosis not present

## 2022-07-13 DIAGNOSIS — Z9484 Stem cells transplant status: Secondary | ICD-10-CM | POA: Diagnosis not present

## 2022-07-13 DIAGNOSIS — Z9481 Bone marrow transplant status: Secondary | ICD-10-CM | POA: Diagnosis not present

## 2022-07-13 DIAGNOSIS — Z86711 Personal history of pulmonary embolism: Secondary | ICD-10-CM | POA: Diagnosis not present

## 2022-07-13 DIAGNOSIS — I10 Essential (primary) hypertension: Secondary | ICD-10-CM | POA: Diagnosis not present

## 2022-07-13 DIAGNOSIS — M1611 Unilateral primary osteoarthritis, right hip: Secondary | ICD-10-CM | POA: Diagnosis not present

## 2022-07-14 DIAGNOSIS — C9000 Multiple myeloma not having achieved remission: Secondary | ICD-10-CM | POA: Diagnosis not present

## 2022-07-14 DIAGNOSIS — Z86711 Personal history of pulmonary embolism: Secondary | ICD-10-CM | POA: Diagnosis not present

## 2022-07-14 DIAGNOSIS — I484 Atypical atrial flutter: Secondary | ICD-10-CM | POA: Diagnosis not present

## 2022-07-14 DIAGNOSIS — M8700 Idiopathic aseptic necrosis of unspecified bone: Secondary | ICD-10-CM | POA: Diagnosis not present

## 2022-07-14 DIAGNOSIS — D61818 Other pancytopenia: Secondary | ICD-10-CM | POA: Diagnosis not present

## 2022-07-14 DIAGNOSIS — I10 Essential (primary) hypertension: Secondary | ICD-10-CM | POA: Diagnosis not present

## 2022-07-14 DIAGNOSIS — Z7901 Long term (current) use of anticoagulants: Secondary | ICD-10-CM | POA: Diagnosis not present

## 2022-07-14 DIAGNOSIS — Z9484 Stem cells transplant status: Secondary | ICD-10-CM | POA: Diagnosis not present

## 2022-07-14 DIAGNOSIS — I110 Hypertensive heart disease with heart failure: Secondary | ICD-10-CM | POA: Diagnosis not present

## 2022-07-14 DIAGNOSIS — Z9481 Bone marrow transplant status: Secondary | ICD-10-CM | POA: Diagnosis not present

## 2022-07-14 DIAGNOSIS — I499 Cardiac arrhythmia, unspecified: Secondary | ICD-10-CM | POA: Diagnosis not present

## 2022-07-14 DIAGNOSIS — M1611 Unilateral primary osteoarthritis, right hip: Secondary | ICD-10-CM | POA: Diagnosis not present

## 2022-07-25 DIAGNOSIS — Z4802 Encounter for removal of sutures: Secondary | ICD-10-CM | POA: Diagnosis not present

## 2022-08-02 ENCOUNTER — Ambulatory Visit: Payer: Medicare Other | Admitting: Cardiology

## 2022-08-22 DIAGNOSIS — M8700 Idiopathic aseptic necrosis of unspecified bone: Secondary | ICD-10-CM | POA: Diagnosis not present

## 2022-08-22 DIAGNOSIS — Z96641 Presence of right artificial hip joint: Secondary | ICD-10-CM | POA: Diagnosis not present

## 2022-08-22 DIAGNOSIS — C9000 Multiple myeloma not having achieved remission: Secondary | ICD-10-CM | POA: Diagnosis not present

## 2022-08-22 DIAGNOSIS — M8708 Idiopathic aseptic necrosis of bone, other site: Secondary | ICD-10-CM | POA: Diagnosis not present

## 2022-09-22 ENCOUNTER — Other Ambulatory Visit: Payer: Self-pay

## 2023-09-27 ENCOUNTER — Other Ambulatory Visit: Payer: Self-pay

## 2024-08-28 DIAGNOSIS — M25552 Pain in left hip: Secondary | ICD-10-CM | POA: Diagnosis not present

## 2024-08-28 DIAGNOSIS — C9000 Multiple myeloma not having achieved remission: Secondary | ICD-10-CM | POA: Diagnosis not present
